# Patient Record
Sex: Female | Born: 1935 | Race: White | Hispanic: No | Smoking: Former smoker
Health system: Southern US, Community
[De-identification: ages and names within clinical notes are randomized; demographics above are authoritative.]

## PROBLEM LIST (undated history)

## (undated) DIAGNOSIS — I509 Heart failure, unspecified: Secondary | ICD-10-CM

## (undated) DIAGNOSIS — K279 Peptic ulcer, site unspecified, unspecified as acute or chronic, without hemorrhage or perforation: Secondary | ICD-10-CM

## (undated) DIAGNOSIS — K5790 Diverticulosis of intestine, part unspecified, without perforation or abscess without bleeding: Secondary | ICD-10-CM

## (undated) DIAGNOSIS — R002 Palpitations: Secondary | ICD-10-CM

## (undated) DIAGNOSIS — E785 Hyperlipidemia, unspecified: Secondary | ICD-10-CM

## (undated) DIAGNOSIS — E119 Type 2 diabetes mellitus without complications: Secondary | ICD-10-CM

## (undated) DIAGNOSIS — Z860100 Personal history of colon polyps, unspecified: Secondary | ICD-10-CM

## (undated) DIAGNOSIS — I10 Essential (primary) hypertension: Secondary | ICD-10-CM

## (undated) DIAGNOSIS — H409 Unspecified glaucoma: Secondary | ICD-10-CM

## (undated) DIAGNOSIS — Z8719 Personal history of other diseases of the digestive system: Secondary | ICD-10-CM

## (undated) DIAGNOSIS — C50919 Malignant neoplasm of unspecified site of unspecified female breast: Secondary | ICD-10-CM

## (undated) DIAGNOSIS — I739 Peripheral vascular disease, unspecified: Secondary | ICD-10-CM

## (undated) DIAGNOSIS — I4891 Unspecified atrial fibrillation: Secondary | ICD-10-CM

## (undated) DIAGNOSIS — K219 Gastro-esophageal reflux disease without esophagitis: Secondary | ICD-10-CM

## (undated) HISTORY — DX: Personal history of other diseases of the digestive system: Z87.19

## (undated) HISTORY — DX: Gastro-esophageal reflux disease without esophagitis: K21.9

## (undated) HISTORY — PX: RENAL ARTERY STENT: SHX2321

## (undated) HISTORY — DX: Personal history of colon polyps, unspecified: Z86.0100

## (undated) HISTORY — DX: Malignant neoplasm of unspecified site of unspecified female breast: C50.919

## (undated) HISTORY — DX: Type 2 diabetes mellitus without complications: E11.9

## (undated) HISTORY — PX: MASTECTOMY: SHX3

## (undated) HISTORY — PX: VAGINAL HYSTERECTOMY: SUR661

## (undated) HISTORY — DX: Diverticulosis of intestine, part unspecified, without perforation or abscess without bleeding: K57.90

## (undated) HISTORY — DX: Peripheral vascular disease, unspecified: I73.9

## (undated) HISTORY — DX: Hyperlipidemia, unspecified: E78.5

## (undated) HISTORY — DX: Peptic ulcer, site unspecified, unspecified as acute or chronic, without hemorrhage or perforation: K27.9

## (undated) HISTORY — DX: Personal history of colonic polyps: Z86.010

## (undated) HISTORY — DX: Heart failure, unspecified: I50.9

## (undated) HISTORY — DX: Palpitations: R00.2

## (undated) HISTORY — DX: Essential (primary) hypertension: I10

## (undated) HISTORY — DX: Unspecified glaucoma: H40.9

## (undated) HISTORY — DX: Unspecified atrial fibrillation: I48.91

---

## 2004-07-20 ENCOUNTER — Ambulatory Visit: Payer: Self-pay | Admitting: Oncology

## 2004-10-07 ENCOUNTER — Ambulatory Visit: Payer: Self-pay | Admitting: Oncology

## 2005-07-20 ENCOUNTER — Ambulatory Visit: Payer: Self-pay | Admitting: Oncology

## 2005-09-25 HISTORY — PX: OTHER SURGICAL HISTORY: SHX169

## 2005-10-09 ENCOUNTER — Ambulatory Visit: Payer: Self-pay | Admitting: Oncology

## 2005-11-03 ENCOUNTER — Ambulatory Visit: Payer: Self-pay

## 2005-11-04 ENCOUNTER — Other Ambulatory Visit: Payer: Self-pay

## 2005-11-04 ENCOUNTER — Inpatient Hospital Stay: Payer: Self-pay | Admitting: Internal Medicine

## 2006-02-06 ENCOUNTER — Inpatient Hospital Stay: Payer: Self-pay | Admitting: Unknown Physician Specialty

## 2006-02-06 ENCOUNTER — Other Ambulatory Visit: Payer: Self-pay

## 2006-02-12 ENCOUNTER — Encounter: Payer: Self-pay | Admitting: Internal Medicine

## 2006-02-23 ENCOUNTER — Encounter: Payer: Self-pay | Admitting: Internal Medicine

## 2006-07-16 ENCOUNTER — Ambulatory Visit: Payer: Self-pay | Admitting: Oncology

## 2006-10-11 ENCOUNTER — Ambulatory Visit: Payer: Self-pay | Admitting: Oncology

## 2007-05-31 ENCOUNTER — Ambulatory Visit: Payer: Self-pay

## 2007-06-11 ENCOUNTER — Inpatient Hospital Stay: Payer: Self-pay | Admitting: Internal Medicine

## 2007-06-26 ENCOUNTER — Ambulatory Visit: Payer: Self-pay | Admitting: Oncology

## 2007-07-10 ENCOUNTER — Ambulatory Visit: Payer: Self-pay | Admitting: Oncology

## 2007-07-27 ENCOUNTER — Ambulatory Visit: Payer: Self-pay | Admitting: Oncology

## 2007-10-14 ENCOUNTER — Ambulatory Visit: Payer: Self-pay | Admitting: Oncology

## 2007-12-03 ENCOUNTER — Ambulatory Visit: Payer: Self-pay | Admitting: Unknown Physician Specialty

## 2008-02-24 ENCOUNTER — Ambulatory Visit: Payer: Self-pay | Admitting: Oncology

## 2008-03-17 ENCOUNTER — Ambulatory Visit: Payer: Self-pay | Admitting: Oncology

## 2008-03-18 ENCOUNTER — Ambulatory Visit: Payer: Self-pay | Admitting: Unknown Physician Specialty

## 2008-03-25 ENCOUNTER — Ambulatory Visit: Payer: Self-pay | Admitting: Oncology

## 2008-04-25 ENCOUNTER — Ambulatory Visit: Payer: Self-pay | Admitting: Oncology

## 2008-07-09 ENCOUNTER — Ambulatory Visit: Payer: Self-pay | Admitting: Oncology

## 2008-07-26 ENCOUNTER — Ambulatory Visit: Payer: Self-pay | Admitting: Oncology

## 2008-10-14 ENCOUNTER — Ambulatory Visit: Payer: Self-pay | Admitting: Oncology

## 2009-01-06 ENCOUNTER — Ambulatory Visit: Payer: Self-pay | Admitting: Oncology

## 2009-01-23 ENCOUNTER — Ambulatory Visit: Payer: Self-pay | Admitting: Oncology

## 2009-06-25 ENCOUNTER — Ambulatory Visit: Payer: Self-pay | Admitting: Oncology

## 2009-07-06 ENCOUNTER — Ambulatory Visit: Payer: Self-pay | Admitting: Oncology

## 2009-07-26 ENCOUNTER — Ambulatory Visit: Payer: Self-pay | Admitting: Oncology

## 2009-10-15 ENCOUNTER — Ambulatory Visit: Payer: Self-pay | Admitting: Oncology

## 2010-03-31 ENCOUNTER — Ambulatory Visit: Payer: Self-pay | Admitting: Vascular Surgery

## 2010-06-25 ENCOUNTER — Ambulatory Visit: Payer: Self-pay | Admitting: Oncology

## 2010-06-28 ENCOUNTER — Inpatient Hospital Stay: Payer: Self-pay | Admitting: *Deleted

## 2010-07-05 ENCOUNTER — Ambulatory Visit: Payer: Self-pay | Admitting: Oncology

## 2010-07-26 ENCOUNTER — Ambulatory Visit: Payer: Self-pay | Admitting: Oncology

## 2010-10-17 ENCOUNTER — Ambulatory Visit: Payer: Self-pay | Admitting: Oncology

## 2011-07-06 ENCOUNTER — Ambulatory Visit: Payer: Self-pay | Admitting: Oncology

## 2011-07-27 ENCOUNTER — Ambulatory Visit: Payer: Self-pay | Admitting: Oncology

## 2011-10-19 ENCOUNTER — Ambulatory Visit: Payer: Self-pay | Admitting: Oncology

## 2012-08-15 ENCOUNTER — Emergency Department: Payer: Self-pay | Admitting: Emergency Medicine

## 2012-08-15 LAB — COMPREHENSIVE METABOLIC PANEL
Albumin: 3.8 g/dL (ref 3.4–5.0)
Anion Gap: 8 (ref 7–16)
BUN: 13 mg/dL (ref 7–18)
Chloride: 106 mmol/L (ref 98–107)
Co2: 24 mmol/L (ref 21–32)
Creatinine: 0.97 mg/dL (ref 0.60–1.30)
EGFR (African American): >60
Osmolality: 280 (ref 275–301)
Potassium: 4 mmol/L (ref 3.5–5.1)
SGOT(AST): 25 U/L (ref 15–37)
Sodium: 138 mmol/L (ref 136–145)
Total Protein: 7.9 g/dL (ref 6.4–8.2)

## 2012-08-15 LAB — CBC
HCT: 31.3 % — ABNORMAL LOW (ref 35.0–47.0)
MCH: 21.3 pg — ABNORMAL LOW (ref 26.0–34.0)
MCHC: 30.6 g/dL — ABNORMAL LOW (ref 32.0–36.0)
MCV: 70 fL — ABNORMAL LOW (ref 80–100)
Platelet: 250 10*3/uL (ref 150–440)
RDW: 16.5 % — ABNORMAL HIGH (ref 11.5–14.5)
WBC: 7.5 10*3/uL (ref 3.6–11.0)

## 2012-08-15 LAB — URINALYSIS, COMPLETE
Bacteria: NONE SEEN
Blood: NEGATIVE
Glucose,UR: NEGATIVE mg/dL (ref 0–75)
Hyaline Cast: 75
Leukocyte Esterase: NEGATIVE
Nitrite: NEGATIVE
Ph: 5 (ref 4.5–8.0)
Protein: 30
RBC,UR: 3 /HPF (ref 0–5)

## 2012-08-15 LAB — TROPONIN I: Troponin-I: <0.02 ng/mL

## 2012-08-15 LAB — PROTIME-INR
INR: 1
Prothrombin Time: 13.7 secs (ref 11.5–14.7)

## 2012-08-15 LAB — APTT: Activated PTT: 28.7 secs (ref 23.6–35.9)

## 2012-10-21 ENCOUNTER — Ambulatory Visit: Payer: Self-pay

## 2013-04-30 ENCOUNTER — Ambulatory Visit: Payer: Self-pay | Admitting: Unknown Physician Specialty

## 2013-05-07 ENCOUNTER — Ambulatory Visit: Payer: Self-pay | Admitting: Radiation Oncology

## 2013-10-23 ENCOUNTER — Ambulatory Visit: Payer: Self-pay

## 2013-10-28 ENCOUNTER — Ambulatory Visit: Payer: Self-pay

## 2013-12-11 ENCOUNTER — Observation Stay: Payer: Self-pay | Admitting: Internal Medicine

## 2013-12-11 DIAGNOSIS — I4891 Unspecified atrial fibrillation: Secondary | ICD-10-CM

## 2013-12-11 DIAGNOSIS — I10 Essential (primary) hypertension: Secondary | ICD-10-CM

## 2013-12-11 DIAGNOSIS — I059 Rheumatic mitral valve disease, unspecified: Secondary | ICD-10-CM

## 2013-12-11 DIAGNOSIS — D509 Iron deficiency anemia, unspecified: Secondary | ICD-10-CM

## 2013-12-11 DIAGNOSIS — E785 Hyperlipidemia, unspecified: Secondary | ICD-10-CM

## 2013-12-11 LAB — CBC
HCT: 28.8 % — ABNORMAL LOW (ref 35.0–47.0)
HGB: 8.4 g/dL — AB (ref 12.0–16.0)
MCH: 18.7 pg — ABNORMAL LOW (ref 26.0–34.0)
MCHC: 29.1 g/dL — ABNORMAL LOW (ref 32.0–36.0)
MCV: 64 fL — AB (ref 80–100)
Platelet: 201 10*3/uL (ref 150–440)
RBC: 4.48 10*6/uL (ref 3.80–5.20)
RDW: 17.3 % — ABNORMAL HIGH (ref 11.5–14.5)
WBC: 3.7 10*3/uL (ref 3.6–11.0)

## 2013-12-11 LAB — COMPREHENSIVE METABOLIC PANEL
ALT: 13 U/L (ref 12–78)
Albumin: 3.4 g/dL (ref 3.4–5.0)
Alkaline Phosphatase: 99 U/L
Anion Gap: 5 — ABNORMAL LOW (ref 7–16)
BILIRUBIN TOTAL: 0.3 mg/dL (ref 0.2–1.0)
BUN: 19 mg/dL — ABNORMAL HIGH (ref 7–18)
CHLORIDE: 106 mmol/L (ref 98–107)
CO2: 28 mmol/L (ref 21–32)
CREATININE: 0.96 mg/dL (ref 0.60–1.30)
Calcium, Total: 9.3 mg/dL (ref 8.5–10.1)
EGFR (Non-African Amer.): 52 — ABNORMAL LOW
Glucose: 171 mg/dL — ABNORMAL HIGH (ref 65–99)
OSMOLALITY: 284 (ref 275–301)
Potassium: 3.1 mmol/L — ABNORMAL LOW (ref 3.5–5.1)
SGOT(AST): 15 U/L (ref 15–37)
Sodium: 139 mmol/L (ref 136–145)
Total Protein: 7.3 g/dL (ref 6.4–8.2)

## 2013-12-11 LAB — URINALYSIS, COMPLETE
BACTERIA: NONE SEEN
BILIRUBIN, UR: NEGATIVE
Blood: NEGATIVE
Glucose,UR: NEGATIVE mg/dL (ref 0–75)
Hyaline Cast: 10
Ketone: NEGATIVE
LEUKOCYTE ESTERASE: NEGATIVE
Nitrite: NEGATIVE
Ph: 6 (ref 4.5–8.0)
Protein: 100
SPECIFIC GRAVITY: 1.015 (ref 1.003–1.030)
Squamous Epithelial: 1
WBC UR: 2 /HPF (ref 0–5)

## 2013-12-11 LAB — LIPASE, BLOOD: Lipase: 277 U/L (ref 73–393)

## 2013-12-11 LAB — TROPONIN I: Troponin-I: <0.02 ng/mL

## 2013-12-11 LAB — MAGNESIUM: MAGNESIUM: 2.1 mg/dL

## 2013-12-12 LAB — BASIC METABOLIC PANEL
ANION GAP: 6 — AB (ref 7–16)
BUN: 12 mg/dL (ref 7–18)
Calcium, Total: 9.1 mg/dL (ref 8.5–10.1)
Chloride: 111 mmol/L — ABNORMAL HIGH (ref 98–107)
Co2: 25 mmol/L (ref 21–32)
Creatinine: 0.71 mg/dL (ref 0.60–1.30)
EGFR (Non-African Amer.): >60
Glucose: 127 mg/dL — ABNORMAL HIGH (ref 65–99)
OSMOLALITY: 284 (ref 275–301)
POTASSIUM: 3.8 mmol/L (ref 3.5–5.1)
SODIUM: 142 mmol/L (ref 136–145)

## 2013-12-12 LAB — CBC WITH DIFFERENTIAL/PLATELET
BASOS PCT: 1.4 %
Basophil #: 0 10*3/uL (ref 0.0–0.1)
EOS ABS: 0.1 10*3/uL (ref 0.0–0.7)
EOS PCT: 3.7 %
HCT: 24.6 % — ABNORMAL LOW (ref 35.0–47.0)
HGB: 7.2 g/dL — ABNORMAL LOW (ref 12.0–16.0)
Lymphocyte #: 1.1 10*3/uL (ref 1.0–3.6)
Lymphocyte %: 35.3 %
MCH: 18.8 pg — ABNORMAL LOW (ref 26.0–34.0)
MCHC: 29.3 g/dL — AB (ref 32.0–36.0)
MCV: 64 fL — AB (ref 80–100)
MONO ABS: 0.3 x10 3/mm (ref 0.2–0.9)
MONOS PCT: 8.6 %
NEUTROS PCT: 51 %
Neutrophil #: 1.7 10*3/uL (ref 1.4–6.5)
PLATELETS: 154 10*3/uL (ref 150–440)
RBC: 3.85 10*6/uL (ref 3.80–5.20)
RDW: 17.6 % — ABNORMAL HIGH (ref 11.5–14.5)
WBC: 3.2 10*3/uL — AB (ref 3.6–11.0)

## 2013-12-12 LAB — HEMOGLOBIN A1C: Hemoglobin A1C: 7.5 % — ABNORMAL HIGH (ref 4.2–6.3)

## 2013-12-12 LAB — TSH: Thyroid Stimulating Horm: 0.748 u[IU]/mL

## 2013-12-13 DIAGNOSIS — I4892 Unspecified atrial flutter: Secondary | ICD-10-CM

## 2013-12-13 LAB — CBC WITH DIFFERENTIAL/PLATELET
BASOS ABS: 0.1 10*3/uL (ref 0.0–0.1)
BASOS PCT: 1.6 %
EOS PCT: 3.4 %
Eosinophil #: 0.1 10*3/uL (ref 0.0–0.7)
HCT: 26.2 % — AB (ref 35.0–47.0)
HGB: 7.7 g/dL — ABNORMAL LOW (ref 12.0–16.0)
Lymphocyte #: 1.1 10*3/uL (ref 1.0–3.6)
Lymphocyte %: 32.7 %
MCH: 18.8 pg — AB (ref 26.0–34.0)
MCHC: 29.3 g/dL — AB (ref 32.0–36.0)
MCV: 64 fL — ABNORMAL LOW (ref 80–100)
Monocyte #: 0.3 x10 3/mm (ref 0.2–0.9)
Monocyte %: 8 %
NEUTROS ABS: 1.8 10*3/uL (ref 1.4–6.5)
NEUTROS PCT: 54.3 %
Platelet: 190 10*3/uL (ref 150–440)
RBC: 4.08 10*6/uL (ref 3.80–5.20)
RDW: 17.5 % — ABNORMAL HIGH (ref 11.5–14.5)
WBC: 3.4 10*3/uL — ABNORMAL LOW (ref 3.6–11.0)

## 2013-12-23 ENCOUNTER — Telehealth: Payer: Self-pay

## 2013-12-23 NOTE — Telephone Encounter (Signed)
Patient contacted regarding discharge from [facility] on 12/25/13.  Patient understands to follow up with Dr. Rockey Situ on 12/25/13 at 1:40 at [facility]. Patient understands discharge instructions? yes Patient understands medications and regiment? yes Patient understands to bring all medications to this visit? yes

## 2013-12-25 ENCOUNTER — Encounter: Payer: Self-pay | Admitting: Cardiovascular Disease

## 2013-12-25 ENCOUNTER — Ambulatory Visit (INDEPENDENT_AMBULATORY_CARE_PROVIDER_SITE_OTHER): Payer: Medicare Other | Admitting: Cardiovascular Disease

## 2013-12-25 VITALS — BP 130/52 | HR 73 | Ht 60.0 in | Wt 103.5 lb

## 2013-12-25 DIAGNOSIS — K922 Gastrointestinal hemorrhage, unspecified: Secondary | ICD-10-CM | POA: Insufficient documentation

## 2013-12-25 DIAGNOSIS — E119 Type 2 diabetes mellitus without complications: Secondary | ICD-10-CM

## 2013-12-25 DIAGNOSIS — D649 Anemia, unspecified: Secondary | ICD-10-CM

## 2013-12-25 DIAGNOSIS — I4891 Unspecified atrial fibrillation: Secondary | ICD-10-CM

## 2013-12-25 DIAGNOSIS — E785 Hyperlipidemia, unspecified: Secondary | ICD-10-CM

## 2013-12-25 MED ORDER — METOPROLOL TARTRATE 25 MG PO TABS: 25.0000 mg | ORAL_TABLET | Freq: Two times a day (BID) | ORAL | Status: DC

## 2013-12-25 MED ORDER — DILTIAZEM HCL 120 MG PO TABS: 120.0000 mg | ORAL_TABLET | Freq: Every day | ORAL | Status: DC

## 2013-12-25 NOTE — Assessment & Plan Note (Signed)
We have encouraged continued exercise, careful diet management in an effort to lose weight. She reports having blurry vision. Suggested she see her eye physician

## 2013-12-25 NOTE — Assessment & Plan Note (Signed)
Encouraged her to stay on her simvastatin 

## 2013-12-25 NOTE — Assessment & Plan Note (Signed)
She has followup with Dr. Hardin Negus. Recent hematocrit in the hospital 28.8, hemoglobin 8.4

## 2013-12-25 NOTE — Assessment & Plan Note (Addendum)
Hospital records were reviewed. Heart rate is relatively well-controlled. Suggested she stay on her diltiazem and metoprolol. We'll avoid anticoagulation given significant extensive ongoing GI bleeding history. Seen by Dr. Vira Agar on a regular basis. Risk and benefit of anticoagulation was discussed with her at length with her and her husband

## 2013-12-25 NOTE — Progress Notes (Signed)
Patient ID: Girtha Rm, female    DOB: June 14, 1922, 80 y.o.   MRN: [id number]  HPI Comments: Sharon Frazier is a pleasant 80 year old woman with history of GI bleed, iron deficiency anemia, laser treatment of gastric angiodysplasias , peptic ulcer disease, GERD, diverticulosis and internal hemorrhoids, hypertension, hyperlipidemia, type 2 diabetes, previous history of tobacco abuse, right breast cancer status post mastectomy and chemotherapy with recent hospital admission 12/11/2013 and discharged 12/14/2013 for new onset atrial fibrillation.  On admission she reported that she was doing well until 2 weeks prior when she developed bronchitis with weakness and cough with sputum. The morning of admission she woke up and did not feel well. She had tachycardia with palpitations. In the emergency room she was found to have atrial fibrillation with RVR. She was started on rate control medications and she was felt to be a poor candidate for anticoagulation. Unclear exactly how long she had been in atrial fibrillation.  Echocardiogram showed ejection fraction 60-65%, mildly elevated right ventricular systolic pressures Notes indicate she had been seen by Dr. Vira Agar recently with EGD and electrocautery. She still had trace blood in her stool on followup visits with him. She was started on diltiazem 120 mg daily, amlodipine was held, she was started on metoprolol  In followup today she feels well with no complaints. She reports that her vision is somewhat blurry which she attributes to her diabetes. Otherwise no falls, no shortness of breath, no near syncope or syncope. No significant chest pain.  EKG shows atrial fibrillation with rate 73 beats a minute, no significant ST or T wave changes, some borderline ST abnormality    Outpatient Encounter Prescriptions as of 12/25/2013  Medication Sig  . diltiazem (CARDIZEM) 120 MG tablet Take 1 tablet (120 mg total) by mouth daily.  . dorzolamide (TRUSOPT) 2 %  ophthalmic solution 1 drop 2 (two) times daily.  Marland Kitchen glipiZIDE (GLUCOTROL) 5 MG tablet Take 5 mg by mouth daily before breakfast.  . iron polysaccharides (NIFEREX) 150 MG capsule Take 150 mg by mouth 2 (two) times daily.   Marland Kitchen latanoprost (XALATAN) 0.005 % ophthalmic solution Place 1 drop into both eyes at bedtime.  Marland Kitchen lisinopril-hydrochlorothiazide (PRINZIDE,ZESTORETIC) 20-12.5 MG per tablet Take 1 tablet by mouth daily.  . metoprolol tartrate (LOPRESSOR) 25 MG tablet Take 1 tablet (25 mg total) by mouth 2 (two) times daily.  Marland Kitchen omeprazole (PRILOSEC) 20 MG capsule Take 20 mg by mouth daily.  . simvastatin (ZOCOR) 10 MG tablet Take 10 mg by mouth daily.    Review of Systems  Constitutional: Negative.   HENT: Negative.   Eyes: Positive for visual disturbance.  Respiratory: Negative.   Cardiovascular: Negative.   Gastrointestinal: Negative.   Endocrine: Negative.   Musculoskeletal: Negative.   Skin: Negative.   Allergic/Immunologic: Negative.   Neurological: Negative.   Hematological: Negative.   Psychiatric/Behavioral: Negative.   All other systems reviewed and are negative.    BP 130/52  Pulse 73  Ht 5' (1.524 m)  Wt 103 lb 8 oz (46.947 kg)  BMI 20.21 kg/m2  Physical Exam  Nursing note and vitals reviewed. Constitutional: She is oriented to person, place, and time. She appears well-developed and well-nourished.  HENT:  Head: Normocephalic.  Nose: Nose normal.  Mouth/Throat: Oropharynx is clear and moist.  Eyes: Conjunctivae are normal. Pupils are equal, round, and reactive to light.  Neck: Normal range of motion. Neck supple. No JVD present.  Cardiovascular: Normal rate, S1 normal, S2 normal, normal heart sounds and  intact distal pulses.  An irregularly irregular rhythm present. Exam reveals no gallop and no friction rub.   No murmur heard. Pulmonary/Chest: Effort normal and breath sounds normal. No respiratory distress. She has no wheezes. She has no rales. She exhibits no  tenderness.  Abdominal: Soft. Bowel sounds are normal. She exhibits no distension. There is no tenderness.  Musculoskeletal: Normal range of motion. She exhibits no edema and no tenderness.  Lymphadenopathy:    She has no cervical adenopathy.  Neurological: She is alert and oriented to person, place, and time. Coordination normal.  Skin: Skin is warm and dry. No rash noted. No erythema.  Psychiatric: She has a normal mood and affect. Her behavior is normal. Judgment and thought content normal.    Assessment and Plan

## 2013-12-25 NOTE — Assessment & Plan Note (Signed)
Recent procedures, long history of GI bleeding. High risk of recurrent bleeding if anticoagulation is started

## 2013-12-25 NOTE — Patient Instructions (Signed)
You are doing well. No medication changes were made.  Call the office for leg swelling, shortness of breath, weight gain   Please call us if you have new issues that need to be addressed before your next appt.  Your physician wants you to follow-up in: 6 months.  You will receive a reminder letter in the mail two months in advance. If you don't receive a letter, please call our office to schedule the follow-up appointment.

## 2014-02-05 ENCOUNTER — Inpatient Hospital Stay: Payer: Self-pay | Admitting: Family Medicine

## 2014-02-05 LAB — BASIC METABOLIC PANEL
ANION GAP: 8 (ref 7–16)
BUN: 17 mg/dL (ref 7–18)
Calcium, Total: 10 mg/dL (ref 8.5–10.1)
Chloride: 105 mmol/L (ref 98–107)
Co2: 25 mmol/L (ref 21–32)
Creatinine: 0.87 mg/dL (ref 0.60–1.30)
EGFR (African American): >60
EGFR (Non-African Amer.): 59 — ABNORMAL LOW
Glucose: 114 mg/dL — ABNORMAL HIGH (ref 65–99)
OSMOLALITY: 278 (ref 275–301)
POTASSIUM: 3.5 mmol/L (ref 3.5–5.1)
SODIUM: 138 mmol/L (ref 136–145)

## 2014-02-05 LAB — URINALYSIS, COMPLETE
Bacteria: NONE SEEN
Bilirubin,UR: NEGATIVE
Blood: NEGATIVE
Glucose,UR: NEGATIVE mg/dL (ref 0–75)
Nitrite: NEGATIVE
Ph: 5 (ref 4.5–8.0)
RBC,UR: 1 /HPF (ref 0–5)
Specific Gravity: 1.021 (ref 1.003–1.030)
WBC UR: 5 /HPF (ref 0–5)

## 2014-02-05 LAB — CBC WITH DIFFERENTIAL/PLATELET
Basophil #: 0.1 10*3/uL (ref 0.0–0.1)
Basophil %: 0.6 %
EOS ABS: 0 10*3/uL (ref 0.0–0.7)
Eosinophil %: 0.1 %
HCT: 31 % — ABNORMAL LOW (ref 35.0–47.0)
HGB: 10 g/dL — ABNORMAL LOW (ref 12.0–16.0)
LYMPHS PCT: 10.5 %
Lymphocyte #: 1 10*3/uL (ref 1.0–3.6)
MCH: 20.1 pg — ABNORMAL LOW (ref 26.0–34.0)
MCHC: 32.4 g/dL (ref 32.0–36.0)
MCV: 62 fL — AB (ref 80–100)
MONO ABS: 0.7 x10 3/mm (ref 0.2–0.9)
MONOS PCT: 7.4 %
Neutrophil #: 7.5 10*3/uL — ABNORMAL HIGH (ref 1.4–6.5)
Neutrophil %: 81.4 %
PLATELETS: 222 10*3/uL (ref 150–440)
RBC: 4.99 10*6/uL (ref 3.80–5.20)
RDW: 18.2 % — ABNORMAL HIGH (ref 11.5–14.5)
WBC: 9.2 10*3/uL (ref 3.6–11.0)

## 2014-02-05 LAB — TROPONIN I: Troponin-I: <0.02 ng/mL

## 2014-02-05 LAB — IRON AND TIBC
IRON BIND. CAP.(TOTAL): 510 ug/dL — AB (ref 250–450)
IRON: 21 ug/dL — AB (ref 50–170)
Iron Saturation: 4 %
Unbound Iron-Bind.Cap.: 489 ug/dL

## 2014-02-06 DIAGNOSIS — R10819 Abdominal tenderness, unspecified site: Secondary | ICD-10-CM

## 2014-02-06 DIAGNOSIS — D500 Iron deficiency anemia secondary to blood loss (chronic): Secondary | ICD-10-CM

## 2014-02-06 DIAGNOSIS — R5381 Other malaise: Secondary | ICD-10-CM

## 2014-02-06 DIAGNOSIS — R5383 Other fatigue: Secondary | ICD-10-CM

## 2014-02-06 DIAGNOSIS — I4891 Unspecified atrial fibrillation: Secondary | ICD-10-CM

## 2014-02-06 LAB — CBC WITH DIFFERENTIAL/PLATELET
Basophil #: 0.1 10*3/uL (ref 0.0–0.1)
Basophil %: 1.1 %
EOS ABS: 0.1 10*3/uL (ref 0.0–0.7)
EOS PCT: 1.1 %
HCT: 25.9 % — ABNORMAL LOW (ref 35.0–47.0)
HGB: 7.5 g/dL — ABNORMAL LOW (ref 12.0–16.0)
Lymphocyte #: 0.8 10*3/uL — ABNORMAL LOW (ref 1.0–3.6)
Lymphocyte %: 13.3 %
MCH: 18 pg — ABNORMAL LOW (ref 26.0–34.0)
MCHC: 28.8 g/dL — ABNORMAL LOW (ref 32.0–36.0)
MCV: 62 fL — AB (ref 80–100)
MONO ABS: 0.6 x10 3/mm (ref 0.2–0.9)
Monocyte %: 9.7 %
NEUTROS ABS: 4.7 10*3/uL (ref 1.4–6.5)
NEUTROS PCT: 74.8 %
Platelet: 172 10*3/uL (ref 150–440)
RBC: 4.15 10*6/uL (ref 3.80–5.20)
RDW: 17.7 % — ABNORMAL HIGH (ref 11.5–14.5)
WBC: 6.3 10*3/uL (ref 3.6–11.0)

## 2014-02-06 LAB — HEMOGLOBIN: HGB: 7.8 g/dL — AB (ref 12.0–16.0)

## 2014-02-06 LAB — OCCULT BLOOD X 1 CARD TO LAB, STOOL: Occult Blood, Feces: NEGATIVE

## 2014-02-06 LAB — BASIC METABOLIC PANEL
ANION GAP: 6 — AB (ref 7–16)
BUN: 15 mg/dL (ref 7–18)
CHLORIDE: 110 mmol/L — AB (ref 98–107)
Calcium, Total: 9.1 mg/dL (ref 8.5–10.1)
Co2: 26 mmol/L (ref 21–32)
Creatinine: 0.74 mg/dL (ref 0.60–1.30)
EGFR (African American): >60
EGFR (Non-African Amer.): >60
Glucose: 98 mg/dL (ref 65–99)
Osmolality: 284 (ref 275–301)
Potassium: 3.6 mmol/L (ref 3.5–5.1)
Sodium: 142 mmol/L (ref 136–145)

## 2014-02-06 LAB — FERRITIN: Ferritin (ARMC): 10 ng/mL (ref 8–388)

## 2014-02-06 LAB — LIPID PANEL
CHOLESTEROL: 101 mg/dL (ref 0–200)
HDL Cholesterol: 38 mg/dL — ABNORMAL LOW (ref 40–60)
LDL CHOLESTEROL, CALC: 48 mg/dL (ref 0–100)
TRIGLYCERIDES: 75 mg/dL (ref 0–200)
VLDL CHOLESTEROL, CALC: 15 mg/dL (ref 5–40)

## 2014-02-06 LAB — IRON AND TIBC
Iron Bind.Cap.(Total): 395 ug/dL (ref 250–450)
Iron Saturation: 4 %
Iron: 14 ug/dL — ABNORMAL LOW (ref 50–170)
UNBOUND IRON-BIND. CAP.: 381 ug/dL

## 2014-02-06 LAB — TSH: THYROID STIMULATING HORM: 0.531 u[IU]/mL

## 2014-02-06 LAB — LIPASE, BLOOD: LIPASE: 257 U/L (ref 73–393)

## 2014-02-06 LAB — MAGNESIUM: Magnesium: 2 mg/dL

## 2014-02-06 LAB — HEMOGLOBIN A1C: Hemoglobin A1C: 6.6 % — ABNORMAL HIGH (ref 4.2–6.3)

## 2014-02-07 DIAGNOSIS — I369 Nonrheumatic tricuspid valve disorder, unspecified: Secondary | ICD-10-CM

## 2014-02-07 LAB — HEPATIC FUNCTION PANEL A (ARMC)
ALBUMIN: 3.2 g/dL — AB (ref 3.4–5.0)
ALK PHOS: 87 U/L
ALT: 12 U/L (ref 12–78)
AST: 14 U/L — AB (ref 15–37)
BILIRUBIN TOTAL: 0.4 mg/dL (ref 0.2–1.0)
Bilirubin, Direct: 0.1 mg/dL (ref 0.00–0.20)
TOTAL PROTEIN: 7.1 g/dL (ref 6.4–8.2)

## 2014-02-09 ENCOUNTER — Telehealth: Payer: Self-pay

## 2014-02-09 NOTE — Telephone Encounter (Signed)
L MOM TO SCHEDULE PH APPT

## 2014-04-20 ENCOUNTER — Inpatient Hospital Stay: Payer: Self-pay | Admitting: Internal Medicine

## 2014-04-20 LAB — PROTIME-INR
INR: 1.2
Prothrombin Time: 15.2 secs — ABNORMAL HIGH (ref 11.5–14.7)

## 2014-04-20 LAB — COMPREHENSIVE METABOLIC PANEL
ALBUMIN: 3.4 g/dL (ref 3.4–5.0)
ANION GAP: 3 — AB (ref 7–16)
AST: 22 U/L (ref 15–37)
Alkaline Phosphatase: 73 U/L
BILIRUBIN TOTAL: 0.6 mg/dL (ref 0.2–1.0)
BUN: 24 mg/dL — AB (ref 7–18)
CALCIUM: 9.3 mg/dL (ref 8.5–10.1)
CREATININE: 0.7 mg/dL (ref 0.60–1.30)
Chloride: 109 mmol/L — ABNORMAL HIGH (ref 98–107)
Co2: 31 mmol/L (ref 21–32)
EGFR (Non-African Amer.): >60
Glucose: 140 mg/dL — ABNORMAL HIGH (ref 65–99)
Osmolality: 291 (ref 275–301)
Potassium: 3.5 mmol/L (ref 3.5–5.1)
SGPT (ALT): 17 U/L
Sodium: 143 mmol/L (ref 136–145)
TOTAL PROTEIN: 6.8 g/dL (ref 6.4–8.2)

## 2014-04-20 LAB — CBC
HCT: 26.4 % — AB (ref 35.0–47.0)
HGB: 7.1 g/dL — ABNORMAL LOW (ref 12.0–16.0)
MCH: 16.8 pg — ABNORMAL LOW (ref 26.0–34.0)
MCHC: 27 g/dL — ABNORMAL LOW (ref 32.0–36.0)
MCV: 63 fL — ABNORMAL LOW (ref 80–100)
Platelet: 187 10*3/uL (ref 150–440)
RBC: 4.23 10*6/uL (ref 3.80–5.20)
RDW: 19 % — AB (ref 11.5–14.5)
WBC: 6.1 10*3/uL (ref 3.6–11.0)

## 2014-04-20 LAB — CK TOTAL AND CKMB (NOT AT ARMC)
CK, TOTAL: 21 U/L — AB
CK-MB: 1 ng/mL (ref 0.5–3.6)

## 2014-04-20 LAB — TROPONIN I
Troponin-I: <0.02 ng/mL
Troponin-I: <0.02 ng/mL

## 2014-04-20 LAB — PRO B NATRIURETIC PEPTIDE: B-TYPE NATIURETIC PEPTID: 10312 pg/mL — AB (ref 0–450)

## 2014-04-21 LAB — CBC WITH DIFFERENTIAL/PLATELET
Basophil #: 0 10*3/uL (ref 0.0–0.1)
Basophil %: 0.8 %
EOS ABS: 0 10*3/uL (ref 0.0–0.7)
Eosinophil %: 0.3 %
HCT: 24.5 % — ABNORMAL LOW (ref 35.0–47.0)
HGB: 6.8 g/dL — ABNORMAL LOW (ref 12.0–16.0)
LYMPHS ABS: 0.9 10*3/uL — AB (ref 1.0–3.6)
Lymphocyte %: 16.9 %
MCH: 17 pg — ABNORMAL LOW (ref 26.0–34.0)
MCHC: 27.7 g/dL — AB (ref 32.0–36.0)
MCV: 61 fL — ABNORMAL LOW (ref 80–100)
MONO ABS: 0.5 x10 3/mm (ref 0.2–0.9)
Monocyte %: 9.5 %
NEUTROS ABS: 3.9 10*3/uL (ref 1.4–6.5)
Neutrophil %: 72.5 %
PLATELETS: 175 10*3/uL (ref 150–440)
RBC: 3.98 10*6/uL (ref 3.80–5.20)
RDW: 19.2 % — AB (ref 11.5–14.5)
WBC: 5.4 10*3/uL (ref 3.6–11.0)

## 2014-04-22 LAB — CBC WITH DIFFERENTIAL/PLATELET
Basophil #: 0 10*3/uL (ref 0.0–0.1)
Basophil %: 0.9 %
EOS PCT: 1.7 %
Eosinophil #: 0.1 10*3/uL (ref 0.0–0.7)
HCT: 29 % — ABNORMAL LOW (ref 35.0–47.0)
HGB: 8.5 g/dL — ABNORMAL LOW (ref 12.0–16.0)
Lymphocyte #: 0.8 10*3/uL — ABNORMAL LOW (ref 1.0–3.6)
Lymphocyte %: 19.3 %
MCH: 19.1 pg — ABNORMAL LOW (ref 26.0–34.0)
MCHC: 29.3 g/dL — AB (ref 32.0–36.0)
MCV: 65 fL — AB (ref 80–100)
Monocyte #: 0.6 x10 3/mm (ref 0.2–0.9)
Monocyte %: 13.1 %
Neutrophil #: 2.8 10*3/uL (ref 1.4–6.5)
Neutrophil %: 65 %
PLATELETS: 164 10*3/uL (ref 150–440)
RBC: 4.44 10*6/uL (ref 3.80–5.20)
RDW: 23.5 % — AB (ref 11.5–14.5)
WBC: 4.4 10*3/uL (ref 3.6–11.0)

## 2014-04-22 LAB — BASIC METABOLIC PANEL
Anion Gap: 7 (ref 7–16)
BUN: 18 mg/dL (ref 7–18)
CO2: 36 mmol/L — AB (ref 21–32)
CREATININE: 0.81 mg/dL (ref 0.60–1.30)
Calcium, Total: 9 mg/dL (ref 8.5–10.1)
Chloride: 98 mmol/L (ref 98–107)
EGFR (Non-African Amer.): >60
Glucose: 67 mg/dL (ref 65–99)
Osmolality: 281 (ref 275–301)
Potassium: 2.4 mmol/L — CL (ref 3.5–5.1)
SODIUM: 141 mmol/L (ref 136–145)

## 2014-04-22 LAB — MAGNESIUM: Magnesium: 1.7 mg/dL — ABNORMAL LOW

## 2014-04-22 LAB — POTASSIUM: Potassium: 3.8 mmol/L (ref 3.5–5.1)

## 2014-04-23 LAB — BODY FLUID CELL COUNT WITH DIFFERENTIAL
Basophil: 0 %
EOS PCT: 0 %
Lymphocytes: 65 %
Neutrophils: 6 %
Nucleated Cell Count: 170 /mm3
Other Cells BF: 0 %
Other Mononuclear Cells: 29 %

## 2014-04-23 LAB — BASIC METABOLIC PANEL
Anion Gap: 5 — ABNORMAL LOW (ref 7–16)
BUN: 14 mg/dL (ref 7–18)
CREATININE: 0.69 mg/dL (ref 0.60–1.30)
Calcium, Total: 9.7 mg/dL (ref 8.5–10.1)
Chloride: 101 mmol/L (ref 98–107)
Co2: 35 mmol/L — ABNORMAL HIGH (ref 21–32)
EGFR (Non-African Amer.): >60
Glucose: 103 mg/dL — ABNORMAL HIGH (ref 65–99)
Osmolality: 282 (ref 275–301)
Potassium: 3.9 mmol/L (ref 3.5–5.1)
Sodium: 141 mmol/L (ref 136–145)

## 2014-04-23 LAB — GLUCOSE, SEROUS FLUID: GLUCOSE, BODY FLUID: 194 mg/dL

## 2014-04-23 LAB — PROTEIN, BODY FLUID: Protein, Body Fluid: 2.4 g/dL

## 2014-04-23 LAB — ALBUMIN, FLUID (OTHER): Body Fluid Albumin: 1.4 g/dL

## 2014-04-23 LAB — LACTATE DEHYDROGENASE, PLEURAL OR PERITONEAL FLUID: LDH, Body Fluid: 48 U/L

## 2014-04-24 LAB — BASIC METABOLIC PANEL
Anion Gap: 5 — ABNORMAL LOW (ref 7–16)
BUN: 16 mg/dL (ref 7–18)
CALCIUM: 9.7 mg/dL (ref 8.5–10.1)
CHLORIDE: 102 mmol/L (ref 98–107)
CO2: 33 mmol/L — AB (ref 21–32)
Creatinine: 0.66 mg/dL (ref 0.60–1.30)
EGFR (African American): >60
Glucose: 101 mg/dL — ABNORMAL HIGH (ref 65–99)
Osmolality: 281 (ref 275–301)
POTASSIUM: 4.1 mmol/L (ref 3.5–5.1)
SODIUM: 140 mmol/L (ref 136–145)

## 2014-04-27 LAB — BODY FLUID CULTURE

## 2014-05-05 ENCOUNTER — Ambulatory Visit: Payer: Self-pay | Admitting: Family

## 2014-05-25 ENCOUNTER — Ambulatory Visit: Payer: Self-pay | Admitting: Family

## 2014-06-19 ENCOUNTER — Encounter: Payer: Self-pay | Admitting: Cardiovascular Disease

## 2014-06-19 ENCOUNTER — Ambulatory Visit (INDEPENDENT_AMBULATORY_CARE_PROVIDER_SITE_OTHER): Payer: Medicare Other | Admitting: Cardiovascular Disease

## 2014-06-19 VITALS — BP 99/60 | HR 69 | Ht 59.0 in | Wt 94.8 lb

## 2014-06-19 DIAGNOSIS — K2991 Gastroduodenitis, unspecified, with bleeding: Secondary | ICD-10-CM

## 2014-06-19 DIAGNOSIS — K2971 Gastritis, unspecified, with bleeding: Secondary | ICD-10-CM

## 2014-06-19 DIAGNOSIS — I509 Heart failure, unspecified: Secondary | ICD-10-CM

## 2014-06-19 DIAGNOSIS — I5032 Chronic diastolic (congestive) heart failure: Secondary | ICD-10-CM | POA: Insufficient documentation

## 2014-06-19 DIAGNOSIS — E785 Hyperlipidemia, unspecified: Secondary | ICD-10-CM

## 2014-06-19 DIAGNOSIS — I4891 Unspecified atrial fibrillation: Secondary | ICD-10-CM

## 2014-06-19 DIAGNOSIS — D508 Other iron deficiency anemias: Secondary | ICD-10-CM

## 2014-06-19 MED ORDER — FUROSEMIDE 40 MG PO TABS
40.0000 mg | ORAL_TABLET | Freq: Two times a day (BID) | ORAL | Status: DC | PRN
Start: 2014-06-19 — End: 2015-08-16

## 2014-06-19 NOTE — Assessment & Plan Note (Signed)
Recommended that she stay on her simvastatin. Significant coronary disease seen on CT scan 

## 2014-06-19 NOTE — Progress Notes (Signed)
Patient ID: Sharon Frazier, female    DOB: 27-Mar-1922, 80 y.o.   MRN: [id number]  HPI Comments: Sharon Frazier is a pleasant 80 year old woman with history of GI bleed, iron deficiency anemia, laser treatment of gastric angiodysplasias , peptic ulcer disease, GERD, diverticulosis and internal hemorrhoids, hypertension, hyperlipidemia, type 2 diabetes, previous history of tobacco abuse, right breast cancer status post mastectomy and chemotherapy with  hospital admission 12/11/2013 and discharged 12/14/2013 for new onset atrial fibrillation.  she developed bronchitis with weakness and cough with sputum.  In the emergency room she was found to have atrial fibrillation with RVR. She was started on rate control medications and she was felt to be a poor candidate for anticoagulation. Unclear exactly how long she had been in atrial fibrillation.  In followup today, she reports that she was in the hospital at the end of July 2015. She presented with shortness of breath and leg swelling. She had pleural effusion. She was started on Lasix with aggressive diuresis. Discharged on Lasix 40 mg twice a day. She was not on Lasix prior to her admission She did have thoracentesis on the right during her hospital stay. Discharge on 04/24/2014. She did have one unit packed red blood cells Prior echocardiogram with ejection fraction 65%. She continues to have problems with anemia. She reports most recent hemoglobin done through Dr. Hardin Negus was 7 Creatinine 1.07, BUN 36  In the hospital CT scan of the chest showing possible bronchopneumonia, moderate bilateral pleural effusions, right greater than left, atherosclerosis of the coronary arteries, emphysema, cirrhosis, abdominal ascites  Overall she feels well today. She denies any significant lower extremity edema. No significant cough or shortness of breath. Recently seen in the heart failure clinic 05/25/2014. No medication changes made at that time.  Echocardiogram  showed ejection fraction 60-65%, mildly elevated right ventricular systolic pressures Notes indicate she had been seen by Dr. Vira Agar recently with EGD and electrocautery. She still had trace blood in her stool on followup visits with him. She was started on diltiazem 120 mg daily, amlodipine was held, she was started on metoprolol  In followup today she feels well with no complaints. She reports that her vision is somewhat blurry which she attributes to her diabetes. Otherwise no falls, no shortness of breath, no near syncope or syncope. No significant chest pain.  EKG shows atrial fibrillation with rate 69 beats a minute, no significant ST or T wave changes, some borderline ST abnormality    Outpatient Encounter Prescriptions as of 06/19/2014  Medication Sig  . diltiazem (CARDIZEM) 120 MG tablet Take 1 tablet (120 mg total) by mouth daily.  . dorzolamide (TRUSOPT) 2 % ophthalmic solution 1 drop 2 (two) times daily.  . furosemide (LASIX) 40 MG tablet Take 40 mg by mouth 2 (two) times daily.  Marland Kitchen glipiZIDE (GLUCOTROL XL) 2.5 MG 24 hr tablet Take 2.5 mg by mouth daily with breakfast.  . iron polysaccharides (NIFEREX) 150 MG capsule Take 150 mg by mouth 2 (two) times daily.   . iron polysaccharides (NIFEREX) 150 MG capsule Take 150 mg by mouth 2 (two) times daily.  Marland Kitchen latanoprost (XALATAN) 0.005 % ophthalmic solution Place 1 drop into both eyes at bedtime.  Marland Kitchen lisinopril-hydrochlorothiazide (PRINZIDE,ZESTORETIC) 10-12.5 MG per tablet Take 1 tablet by mouth daily.  . metoprolol tartrate (LOPRESSOR) 25 MG tablet Take 1 tablet (25 mg total) by mouth 2 (two) times daily.  Marland Kitchen omeprazole (PRILOSEC) 20 MG capsule Take 20 mg by mouth daily.  . potassium chloride (  K-DUR,KLOR-CON) 10 MEQ tablet Take 10 mEq by mouth 2 (two) times daily.  . simvastatin (ZOCOR) 10 MG tablet Take 10 mg by mouth daily.    Review of Systems  Constitutional: Negative.   HENT: Negative.   Eyes: Negative.   Respiratory: Negative.    Cardiovascular: Negative.   Gastrointestinal: Negative.   Endocrine: Negative.   Musculoskeletal: Negative.   Skin: Negative.   Allergic/Immunologic: Negative.   Neurological: Negative.   Hematological: Negative.   Psychiatric/Behavioral: Negative.   All other systems reviewed and are negative.   BP 99/60  Pulse 69  Ht 4\' 11"  (1.499 m)  Wt 94 lb 12 oz (42.978 kg)  BMI 19.13 kg/m2  Physical Exam  Nursing note and vitals reviewed. Constitutional: She is oriented to person, place, and time. She appears well-developed and well-nourished.  HENT:  Head: Normocephalic.  Nose: Nose normal.  Mouth/Throat: Oropharynx is clear and moist.  Eyes: Conjunctivae are normal. Pupils are equal, round, and reactive to light.  Neck: Normal range of motion. Neck supple. No JVD present.  Cardiovascular: Normal rate, S1 normal, S2 normal, normal heart sounds and intact distal pulses.  An irregularly irregular rhythm present. Exam reveals no gallop and no friction rub.   No murmur heard. Pulmonary/Chest: Effort normal and breath sounds normal. No respiratory distress. She has no wheezes. She has no rales. She exhibits no tenderness.  Abdominal: Soft. Bowel sounds are normal. She exhibits no distension. There is no tenderness.  Musculoskeletal: Normal range of motion. She exhibits no edema and no tenderness.  Lymphadenopathy:    She has no cervical adenopathy.  Neurological: She is alert and oriented to person, place, and time. Coordination normal.  Skin: Skin is warm and dry. No rash noted. No erythema.  Psychiatric: She has a normal mood and affect. Her behavior is normal. Judgment and thought content normal.    Assessment and Plan

## 2014-06-19 NOTE — Assessment & Plan Note (Signed)
Chronic atrial fibrillation, rate controlled. Not a candidate for anticoagulation given prior bleeding, underlying anemia

## 2014-06-19 NOTE — Assessment & Plan Note (Signed)
Recent admission to the hospital with acute on chronic diastolic CHF, likely exacerbated by her atrial fibrillation. No pleural effusions on exam today. Recommended she decrease the Lasix down to 40 mg daily. We have arranged for her to pick up a scale to weigh herself on a daily basis. This is available to the heart failure clinic at [facility]. We have recommended she restart her afternoon Lasix for weight gain of 3 or 4 pounds

## 2014-06-19 NOTE — Assessment & Plan Note (Signed)
She denies any recent GI bleed. Most recent hematocrit of 7

## 2014-06-19 NOTE — Assessment & Plan Note (Signed)
Most recent hemoglobin of 7. Currently on iron tabs. She denies any GI bleed

## 2014-06-19 NOTE — Patient Instructions (Addendum)
You are doing well.  Please decrease the furosemide down to one a day Weight daily If you have a 3 to 4 pound weight gain, Take an extra furosemide in the afternoon after lunch  Please call us if you have new issues that need to be addressed before your next appt.  Your physician wants you to follow-up in: 3 months.  You will receive a reminder letter in the mail two months in advance. If you don't receive a letter, please call our office to schedule the follow-up appointment.

## 2014-07-27 ENCOUNTER — Ambulatory Visit: Payer: Self-pay | Admitting: Family

## 2014-09-17 ENCOUNTER — Ambulatory Visit (INDEPENDENT_AMBULATORY_CARE_PROVIDER_SITE_OTHER): Payer: Medicare Other | Admitting: Cardiovascular Disease

## 2014-09-17 ENCOUNTER — Encounter: Payer: Self-pay | Admitting: Cardiovascular Disease

## 2014-09-17 VITALS — BP 120/58 | HR 68 | Ht 60.0 in | Wt 98.0 lb

## 2014-09-17 DIAGNOSIS — I4891 Unspecified atrial fibrillation: Secondary | ICD-10-CM

## 2014-09-17 DIAGNOSIS — I5032 Chronic diastolic (congestive) heart failure: Secondary | ICD-10-CM

## 2014-09-17 DIAGNOSIS — R0602 Shortness of breath: Secondary | ICD-10-CM

## 2014-09-17 NOTE — Assessment & Plan Note (Signed)
Relatively euvolemic on Lasix daily. No changes to her medication

## 2014-09-17 NOTE — Assessment & Plan Note (Signed)
Chronic atrial fibrillation, rate well controlled. Again discussed anticoagulation with her. She is not interested given her high risk of GI bleeding

## 2014-09-17 NOTE — Progress Notes (Signed)
Patient ID: Sharon Frazier, female    DOB: 1922-06-11, 80 y.o.   MRN: [id number]  HPI Comments: Sharon Frazier is a pleasant 80 year old woman with history of GI bleed, iron deficiency anemia, laser treatment of gastric angiodysplasias , peptic ulcer disease, GERD, diverticulosis and internal hemorrhoids, hypertension, hyperlipidemia, type 2 diabetes, previous history of tobacco abuse, right breast cancer status post mastectomy and chemotherapy with  hospital admission 12/11/2013 and discharged 12/14/2013 for new onset atrial fibrillation. she developed bronchitis with weakness and cough with sputum.  In the emergency room she was found to have atrial fibrillation with RVR. She was started on rate control medications and she was felt to be a poor candidate for anticoagulation. Unclear exactly how long she had been in atrial fibrillation.  She presents today for routine follow-up of her atrial fibrillation In general she reports that she is doing well. She has chronic left leg weakness Family reports she continues to be anemic. They report hemoglobin of 8. She takes 2 iron pills per day Occasionally has sharp stomach pains, followed by bowel movements. She takes her Lasix daily, walks with a cane, no recent falls Overall no new complaints  EKG on today's visit shows atrial fibrillation with ventricular rate 67 bpm  Other past medical history  in the hospital at the end of July 2015. She presented with shortness of breath and leg swelling. She had pleural effusion. She was started on Lasix with aggressive diuresis. Discharged on Lasix 40 mg twice a day. She was not on Lasix prior to her admission She did have thoracentesis on the right during her hospital stay. Discharge on 04/24/2014. She did have one unit packed red blood cells Prior echocardiogram with ejection fraction 65%.  In the hospital CT scan of the chest showing possible bronchopneumonia, moderate bilateral pleural effusions, right  greater than left, atherosclerosis of the coronary arteries, emphysema, cirrhosis, abdominal ascites  seen in the heart failure clinic 05/25/2014. No medication changes made at that time.  Echocardiogram showed ejection fraction 60-65%, mildly elevated right ventricular systolic pressures Notes indicate she had been seen by Dr. Vira Agar recently with EGD and electrocautery. She still had trace blood in her stool on followup visits with him. She was started on diltiazem 120 mg daily, amlodipine was held, she was started on metoprolol   Allergies  Allergen Reactions  . Alphagan [Brimonidine]     Arm numbness      Outpatient Encounter Prescriptions as of 09/17/2014  Medication Sig  . aspirin 81 MG tablet Take 81 mg by mouth daily.  Marland Kitchen diltiazem (CARDIZEM) 120 MG tablet Take 1 tablet (120 mg total) by mouth daily.  . dorzolamide (TRUSOPT) 2 % ophthalmic solution 1 drop 2 (two) times daily.  . furosemide (LASIX) 40 MG tablet Take 1 tablet (40 mg total) by mouth 2 (two) times daily as needed. (Patient taking differently: Take 40 mg by mouth daily. )  . glipiZIDE (GLUCOTROL XL) 2.5 MG 24 hr tablet Take 2.5 mg by mouth daily with breakfast.  . iron polysaccharides (NIFEREX) 150 MG capsule Take 150 mg by mouth 2 (two) times daily.  Marland Kitchen latanoprost (XALATAN) 0.005 % ophthalmic solution Place 1 drop into both eyes at bedtime.  Marland Kitchen lisinopril-hydrochlorothiazide (PRINZIDE,ZESTORETIC) 10-12.5 MG per tablet Take 1 tablet by mouth daily.  . metoprolol tartrate (LOPRESSOR) 25 MG tablet Take 1 tablet (25 mg total) by mouth 2 (two) times daily.  Marland Kitchen omeprazole (PRILOSEC) 20 MG capsule Take 20 mg by mouth 2 (two) times  daily before a meal.   . potassium chloride (K-DUR,KLOR-CON) 10 MEQ tablet Take 10 mEq by mouth 2 (two) times daily.  . [DISCONTINUED] simvastatin (ZOCOR) 10 MG tablet Take 10 mg by mouth daily.    Past Medical History  Diagnosis Date  . H/O: GI bleed   . GERD (gastroesophageal reflux disease)    . Diverticulosis   . HTN (hypertension)   . Hyperlipidemia   . Diabetes mellitus without complication   . PUD (peptic ulcer disease)   . Palpitations   . Breast cancer     right  . A-fib   . Glaucoma   . PVD (peripheral vascular disease)     Past Surgical History  Procedure Laterality Date  . Mastectomy      right breast   . Hip surgery      left hip  . Renal artery stent      Social History  reports that she quit smoking about 26 years ago. Her smoking use included Cigarettes. She has a 50 pack-year smoking history. She does not have any smokeless tobacco history on file. She reports that she does not drink alcohol or use illicit drugs.  Family History family history is not on file.   Review of Systems  HENT: Negative.   Respiratory: Negative.   Cardiovascular: Negative.   Gastrointestinal: Negative.   Musculoskeletal: Positive for gait problem.       Left leg weakness  Neurological: Positive for weakness.  Hematological: Negative.   Psychiatric/Behavioral: Negative.   All other systems reviewed and are negative.  BP 120/58 mmHg  Pulse 68  Ht 5' (1.524 m)  Wt 98 lb (44.453 kg)  BMI 19.14 kg/m2  Physical Exam  Constitutional: She is oriented to person, place, and time.  Thin, presenting in a wheelchair, pale  HENT:  Head: Normocephalic.  Nose: Nose normal.  Mouth/Throat: Oropharynx is clear and moist.  Eyes: Conjunctivae are normal. Pupils are equal, round, and reactive to light.  Neck: Normal range of motion. Neck supple. No JVD present.  Cardiovascular: Normal rate, regular rhythm, S1 normal, S2 normal, normal heart sounds and intact distal pulses.  Exam reveals no gallop and no friction rub.   No murmur heard. Pulmonary/Chest: Effort normal and breath sounds normal. No respiratory distress. She has no wheezes. She has no rales. She exhibits no tenderness.  Abdominal: Soft. Bowel sounds are normal. She exhibits no distension. There is no tenderness.   Musculoskeletal: Normal range of motion. She exhibits no edema or tenderness.  Lymphadenopathy:    She has no cervical adenopathy.  Neurological: She is alert and oriented to person, place, and time. Coordination normal.  Skin: Skin is warm and dry. No rash noted. No erythema.  Psychiatric: She has a normal mood and affect. Her behavior is normal. Judgment and thought content normal.    Assessment and Plan  Nursing note and vitals reviewed.

## 2014-09-17 NOTE — Patient Instructions (Signed)
You are doing well. No medication changes were made.  Please call us if you have new issues that need to be addressed before your next appt.  Your physician wants you to follow-up in: 6 months.  You will receive a reminder letter in the mail two months in advance. If you don't receive a letter, please call our office to schedule the follow-up appointment.   

## 2014-11-02 ENCOUNTER — Ambulatory Visit: Payer: Self-pay | Admitting: Family

## 2015-01-16 NOTE — H&P (Signed)
PATIENT NAME:  Sharon Frazier, Sharon Frazier MR#:  [id number] DATE OF BIRTH:  December 12, 1921  DATE OF ADMISSION:  04/20/2014  PRIMARY CARE PHYSICIAN: Dr. Morton Peters., MD.  PRIMARY CARDIOLOGIST:  Minna Merritts, MD.   REFERRING EMERGENCY ROOM PHYSICIAN:  Conni Slipper, MD.  CHIEF COMPLAINT: Shortness of breath and leg swelling.   HISTORY OF PRESENT ILLNESS: An 81 year old female with past medical history of atrial fibrillation, hypertension, diabetes, hyperlipidemia, gastroesophageal reflux disease, peptic ulcer disease, glaucoma, gastric angiectasia status post cauterization, diverticulosis, internal hemorrhoids, breast cancer and peripheral vascular disease, who lives with her son. She is able to walk with a cane. Gradually losing appetite and losing weight over the last few months, now for the last 1 week she started feeling more short of breath which is getting worse over the last 3 days, and she is not able to even sleep flat in bed at night.  She had to sit in a recliner. Her legs were more swollen around the ankle and she is feeling very short of breath with minimal exertion and so she decided to call Dr. Donivan Scull office and he suggested to go to her primary care doctor's office.  She went over there, Dr. Hardin Negus saw her and sent her into the Emergency Room for admission for CHF. On initial workup in the Emergency Room she was found to have elevated BNP level of 10,000 and chest x-ray showed some pulmonary congestion with edema around the ankles and use of accessory muscles for breathing and requiring supplemental oxygen, so she was given injection of Lasix and given to the hospitalist team for further admission for CHF.   REVIEW OF SYSTEMS:  CONSTITUTIONAL: Negative for fever. Positive of fatigue and weakness, has complaint of weight loss. Did not measure how much.  EYES: No blurring, double vision, discharge or redness.  ENT: No tinnitus, ear pain or hearing loss.  RESPIRATORY: The patient  has a dry cough. No wheezing but has shortness of breath.  CARDIOVASCULAR: No chest pain, but has orthopnea, edema. No arrhythmia or palpitations.  GASTROINTESTINAL: No nausea, vomiting, diarrhea, abdominal pain.  GENITOURINARY: No dysuria, hematuria, or increased frequency.  ENDOCRINE: No nocturia. No heat or cold intolerance. No excessive sweating. SKIN: No acne, rashes, or lesions on the skin.  MUSCULOSKELETAL: No pain or swelling in the joints, but has some swelling around ankles on both sides.  NEUROLOGICAL: No numbness, weakness, tremor or vertigo.  PSYCHIATRIC: No anxiety, insomnia, bipolar disorder.   PAST MEDICAL HISTORY:  1.  Atrial fibrillation.  2.  Hypertension.  3.  Diabetes.  4.  Hyperlipidemia.  5.  Gastroesophageal reflux disease. 6.  Peptic ulcer disease, coma.  7.  Glaucoma.  8.  Gastric angiectasia status post cauterization.  9.  History of diverticulosis.  10. Internal hemorrhoids breast cancer.  11. Breast cancer.  12. Peripheral vascular disease.   PAST SURGICAL HISTORY: Right total mastectomy and left hip surgery. Stent in the lower extremity.  SOCIAL HISTORY: The patient does not smoke. She denies drinking and denies any illegal drug use. 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 She used to live alone but after admission the last time in the hospital she moved in with her son.   FAMILY HISTORY: Positive for colon cancer in her sister.   HOME MEDICATIONS:  1.  Simvastatin 10 mg oral once a day.  2.  Omeprazole 20 mg oral 2 times a day.  3.  Metoprolol 25 mg oral 2 times a day.  4.  Latanoprost 0.005% ophthalmic solution in each eye at nighttime.  5.  Hydrochlorothiazide plus lisinopril 12.5/10 mg oral tablet once a day.  6.  Glipizide XL 2.5 mg extended-release once a day.  7.  Ferrex 150 mg oral capsule 2 times a day.  8.  Ergocalciferol 50,000 international units once a week.  9.  Cardizem 120 mg extended-release  capsule once a day.  10. Aspirin 81 mg oral tablet once a day.   PHYSICAL EXAMINATION:  VITAL SIGNS: In ER, temperature 97.7, pulse is 84, respirations 18, blood pressure 163/75, and pulse oximetry is 100% is in the room air.  GENERAL: The patient is fully alert and oriented to time, place, and person she appears thin and pale.  HEENT: Head and neck atraumatic. Conjunctivae pale. Oral mucosa moist.  NECK: Supple. No JVD.  RESPIRATORY: Bilateral equal air entry. Crepitation present, somewhat decreased air entry in lower lobes.  CARDIOVASCULAR: S1, S2 present, irregular, no murmur appreciated.  ABDOMEN: Soft, nontender. Bowel sounds present. No organomegaly felt.  MUSCULOSKELETAL:  Joints: No swelling or tenderness. Legs: Bilateral pitting edema present around ankles. No pain or swelling in the joints.  SKIN: No acne, rashes, or lesions.  NEUROLOGICAL: No numbness. The patient has generalized weakness 4/5 in all limbs. No tremor or rigidity. Follows commands.  PSYCHIATRIC: Does not appear to have any acute psychiatric illness at this time.   IMPORTANT LABORATORY RESULTS: Glucose 140, BNP 10,312, BUN 24, creatinine 0.70, sodium 143, potassium is 3.5, chloride 109 and CO2 31, calcium is 9.3, total protein 6.8, albumin 3.4, bilirubin 0.6, alkaline phosphatase 73, SGOT 22 and SGPT 17, WBC 6.1, hemoglobin 7.1, platelet count 187,000, MCV 63, prothrombin time 15.2. INR 1.2,   Echocardiogram was done on May 16 which showed ejection fraction 60-65%, normal global left ventricular systolic function, normal right ventricular size and systolic function, mild dilated left atrium, mild elevated pulmonary artery systolic pressure.   ASSESSMENT AND PLAN: An 81 year old female with multiple past medical histories came to hospital with worsening leg edema and shortness of breath, has elevated BNP, some pulmonary edema on chest x-ray and is using accessory muscles of respiration.  1.  Acute diastolic congestive  heart failure. Echocardiogram was done 2 months ago which showed ejection fraction of 65%, so most likely this is diastolic heart failure. We will admit her to telemetry floor, give IV Lasix and follow output measurements. Will put her on a low salt diet.  2.  History of atrial fibrillation. The patient is taking aspirin, Cardizem extended-release. We will continue the same.  3.  Hypertension. We will continue the hydrochlorothiazide, lisinopril and metoprolol as she is taking at home.  4.  Diabetes: We will continue glipizide and put her on sliding scale insulin coverage.  5.  Anemia, which appears to be iron deficiency anemia, and weight loss will do stool guaiac study, though it was negative in the past, and we will put her on replacement.  6.  Complaint of weight loss with chest x-ray that is not clear-looking, with pleural effusion. We will also do a CT scan of the chest with contrast to rule out any occult malignancy as she has complaint of weight loss.   CODE STATUS:  The patient is DNR.  Her medical power of attorney is her son and she has made clear her wishes to me and she also has a living will concerning her wishes not to have resuscitation or  to stay alive on machines.   Total time spent during this admission: 50 minutes.    ____________________________ Ceasar Lund Anselm Jungling, MD vgv:lt D: 04/20/2014 13:26:38 ET T: 04/20/2014 14:18:46 ET JOB#: [id number]  cc: Ceasar Lund. Anselm Jungling, MD, <Dictator> Vaughan Basta MD ELECTRONICALLY SIGNED 04/21/2014 16:16

## 2015-01-16 NOTE — H&P (Signed)
PATIENT NAME:  Sharon Frazier, Sharon Frazier MR#:  [id number] DATE OF BIRTH:  09-Aug-1922  DATE OF ADMISSION:  12/11/2013  PRIMARY CARE PHYSICIAN:  Dr. Hardin Negus.  CHIEF COMPLAINT: Weakness and palpitation for 1 week.   HISTORY OF PRESENT ILLNESS: An 81 year old Caucasian female with a history of hypertension, diabetes, hyperlipidemia was sent to ED from home due to weakness and palpitation for 1 week. The patient is alert, awake, oriented, in no acute distress. The patient only complains of generalized weakness and the palpitations for the past 1 week. She denies any chest pain, diaphoresis or abdominal pain, nausea, vomiting or diarrhea. The patient denies any fever or chills. The patient was noted to have new afib with RVR at 125 and was treated with Cardizem. Heart rate decreased to 88.   PAST MEDICAL HISTORY: Hypertension, diabetes, hyperlipidemia, GERD, PUD, glaucoma, gastric angioectasias, status post coagulation, history of laser treatment of  angioectasias in 2007, history of diverticulosis and internal hemorrhoid, history of right breast cancer status post mastectomy and chemo.   PAST SURGICAL HISTORY: Right mastectomy, left hip surgery due to fracture, stent in bilateral lower extremities.   SOCIAL HISTORY: No smoking, alcohol drinking or illicit drugs.   FAMILY HISTORY: Sister has colon cancer.   ALLERGIES: ALPHAGAN.  HOME MEDICATIONS: 1.  Zocor 10 mg p.o. at bedtime. 2.  Omeprazole 20 mg p.o. b.i.d. 3.  Latanoprost 0.005% one drop to each eye daily.  4.  HCTZ/lisinopril 12.5 mg/10 mg p.o. tablets 1 tab. p.o. daily.  5.  Glipizide XL 5 mg p.o. daily.  6.  Ferrex 150 oral capsule 1 cap b.i.d.  7.  Dorzolamide 2% ophthalmic solution 1 drop to each eye twice a day.  8.  Norvasc 5 mg p.o. daily.   REVIEW OF SYSTEMS: CONSTITUTIONAL: The patient denies any fever or chills. No headache or dizziness, but has generalized weakness. EYES:  No double vision or blurry vision. ENT: No postnasal drip,  slurred speech or dysphagia.  CARDIOVASCULAR: No chest pain but has palpitations and no orthopnea, nocturnal dyspnea. No leg edema.  PULMONARY: No cough, sputum, shortness of breath or hemoptysis.  GASTROINTESTINAL: No abdominal pain, nausea, vomiting, diarrhea. No melena or bloody stool.  GENITOURINARY: No dysuria, hematuria or incontinence.  SKIN: No rash or jaundice.  NEUROLOGY: No syncope, loss of consciousness or seizure.  ENDOCRINE: No polyuria, polydipsia, heat or cold intolerance.  HEMATOLOGY: No easy bruising or bleeding.   PHYSICAL EXAMINATION: VITAL SIGNS:  Temperature 98, blood pressure 152/72, pulse 127, oxygen saturation 95% on oxygen.  GENERAL: The patient is alert, awake, oriented, in no acute distress.  HEENT: Pupils round, equal and reactive to light and accommodation. NECK:  Supple. No JVD or carotid bruits are noted. No lymphadenopathy. No thyromegaly. Moist oral mucosa.  Clear oropharynx.  CARDIOVASCULAR: S1, S2 irregular rate. Tachycardia.  No murmurs or gallops. PULMONARY: Bilateral air entry. No wheezing or rales. No use of accessory muscle to breathe.  ABDOMEN: Soft. No distention or tenderness. No organomegaly. Bowel sounds present.  EXTREMITIES: No edema, clubbing or cyanosis. No calf tenderness. Bilateral pedal pulses present.  SKIN: No rash or jaundice.  NEUROLOGY: AO x 3. No focal deficit. Power 5/5. Sensation intact.   LABORATORY, DIAGNOSTIC AND RADIOLOGICAL DATA:  Urinalysis is negative.  Abdominal ultrasound: No evidence of gallstones, no dilatation or acute findings.   Chest x-ray: No active disease.  WBC 3.7, hemoglobin 8.4, platelets 201. Glucose 171, BUN 19, creatinine 0.93. Sodium 139, potassium 3.1, chloride 106, bicarb 28. Lipase 277,  magnesium 2.1.  EKG showed afib at 125 bpm.  IMPRESSIONS: 1.  New onset atrial fibrillation. 2.  Hypertension malignancy.  3.  Hypokalemia, anemia, diabetes.  4.  History of peptic ulcer disease.   PLAN OF  TREATMENT: 1.  The patient will be admitted to telemetry floor. We will start Lovenox b.i.d. The patient was treated with IV Cardizem. Since the patient's heart rate was 88, I started p.o. Cardizem. However, the patient's heart rate increased to 127. We will monitor and give IV Cardizem p.r.n. May start Cardizem drip p.r.n.  2.  We will get an echocardiograph and a cardiology consult.  3.  I discussed with the patient and the patient's husband about anticoagulation medication, the benefit and the side effect of Coumadin, Xarelto, Eliquis and Pradaxa. I will defer to Dr. Rockey Situ for anticoagulation decision. The patient is 81 years old, probably needs aspirin. 4.  The patient now is on Lovenox. Since the patient has anemia and PUD history, we need to monitor hemoglobin closely. Watch for bleeding.  5.  For diabetes, we will start sliding scale.  6.  I discussed the patient's condition and plan of treatment with the patient and the patient's husband.   TIME SPENT: About 62 minutes.  ____________________________ Demetrios Loll, MD qc:ce D: 12/11/2013 16:58:28 ET T: 12/11/2013 18:54:56 ET JOB#: [id number]  cc: Demetrios Loll, MD, <Dictator> Demetrios Loll MD ELECTRONICALLY SIGNED 12/12/2013 14:51

## 2015-01-16 NOTE — Discharge Summary (Signed)
Dates of Admission and Diagnosis:  Date of Admission 05-Feb-2014   Date of Discharge 07-Feb-2014   Admitting Diagnosis weakness   Final Diagnosis 1. Acute right temporoparietal CVA 2. Afib with RVR 3. Possible cirrhosis 4. h/o PUD, AV malformations and h/o GI bleed 5. HTN    Chief Complaint/History of Present Illness REASON FOR ADMISSION: Weakness.  HISTORY OF PRESENT ILLNESS:  This is a very nice 81 year old female who has history of recent admission on 12/11/2013 due to new onset atrial fibrillation. At that moment, the patient was discharged with medications for rate control, diltiazem and metoprolol. The patient was not put on anticoagulation due to previous history of GI bleeding.  The patient comes today with a history of feeling weak for 1 week. She has difficulty walking that has been progressing since then. The patient has soreness on her rib cage due to her position whenever she walks and slowly this has been getting worse to the point that she could not even get up to the bathroom today. The patient has no appetite, has not been eating or drinking very much. The patient denies any singular incident that she can recall of weakness with slurred speech, headaches or any similar symptoms. The patient states that this is just going on for a week, getting worse. Whenever she checked in with her primary care physician, he recommended to be seen in the ER and her cardiologist thought that she might have some issues that could be related to side effects of her medications.  The patient actually has history of anemia. In the past month of March, her hemoglobin was 7.7. Today it is actually much better; it is 10.   Allergies:  Alphagan: Unknown  Rhogam:  15-May-15 07:32   Notification Eulis Manly  Notification Date 02-06-2014  Notification Time 1103 (Result(s) reported on 06 Feb 2014 at 11:08AM.)  Thyroid:  15-May-15 03:28   Thyroid Stimulating Hormone 0.531  (0.45-4.50 (International Unit)  ----------------------- Pregnant patients have  different reference  ranges for TSH:  - - - - - - - - - -  Pregnant, first trimetser:  0.36 - 2.50 uIU/mL)  LabObservation:  16-May-15 09:17   OBSERVATION Reason for Test  Hepatic:  16-May-15 12:21   Bilirubin, Total 0.4  Bilirubin, Direct 0.1 (Result(s) reported on 07 Feb 2014 at 01:20PM.)  Alkaline Phosphatase 87 (45-117 NOTE: New Reference Range 08/15/13)  SGPT (ALT) 12  SGOT (AST)  14  Total Protein, Serum 7.1  Albumin, Serum  3.2  Routine BB:  14-May-15 12:55   ABO Group + Rh Type O Positive  Antibody Screen POSITIVE (Result(s) reported on 06 Feb 2014 at 07:17AM.)  15-May-15 07:32   Antibody 1 Anti- E  General Ref:  14-May-15 12:55   Vitamin D, 25-Hydroxy, Serum ========== TEST NAME ==========  ========= RESULTS =========  = REFERENCE RANGE =  VITAMIN D  Vitamin D, 25-Hydroxy Vitamin D, 25-Hydroxy           [L  17.2 ng/mL           ]        30.0-100.0 Vitamin D deficiency has been defined by the [place] practice guideline as a level of serum 25-OH vitamin D less than 20 ng/mL (1,2). The Endocrine Society went on to further define vitamin D insufficiency as a level between 21 and 29 ng/mL (2). 1. IOM (Institute of Medicine). 2010. Dietary reference    intakes for calcium and D. [place]:  The    [employer]. 2. Holick MF, Binkley Maiden, Bischoff-Ferrari HA, et al.    Evaluation, treatment, and prevention of vitamin D    deficiency: an Endocrine Society clinical practice    guideline. JCEM. 2011 Jul; 96(7):1911-30.               [facility]            No: [id number]           [address], [place], [place] [address]           Lindon Romp, MD         [phone]   Result(s) reported on 06 Feb 2014 at 07:47AM.  Cardiology:  16-May-15 09:17   Echo Doppler REASON FOR EXAM:     COMMENTS:     PROCEDURE: [facility] -  ECHO DOPPLER COMPLETE(TRANSTHOR)  - Feb 07 2014  9:17AM   RESULT: Echocardiogram Report  Patient Name:   Sharon Frazier Date of Exam: 02/07/2014 Medical Rec #:  [id number]              Custom1: Date of Birth:  [birth year]          Height:       59.0 in Patient Age:    89 years            Weight:       96.0 lb Patient Gender: F                   BSA:          1.35 m??  Indications: CVA Sonographer:    Janalee Dane RCS Referring Phys: James Ivanoff, ROBERTO  Summary:  1. No source of CVA noted. Rhythm is atrial fibrillation.  2. Left ventricular ejection fraction, by visual estimation, is 60 to  65%.  3. Normal global left ventricular systolic function.  4. Normal right ventricular size and systolic function.  5. Mildly dilated left atrium.  6. Mild tricuspid regurgitation.  7. Mildly elevated pulmonary artery systolic pressure. 2D AND M-MODE MEASUREMENTS (normal ranges within parentheses): Left Ventricle:       Normal IVSd (2D):      0.98 cm (0.7-1.1) LVPWd (2D):     1.06 cm (0.7-1.1) Aorta/LA:                  Normal LVIDd (2D):     3.54 cm (3.4-5.7) Aortic Root (2D): 2.90 cm (2.4-3.7) LVIDs (2D):     2.00 cm           Left Atrium (2D): 4.40 cm (1.9-4.0) LV FS (2D):     43.5 %   (>25%) LV EF (2D):     75.7 %   (>50%)                                   Right Ventricle:                                   RVd (2D):        2.94 cm SPECTRAL DOPPLER ANALYSIS (where applicable): Tricuspid Valveand PA/RV Systolic Pressure: TR Max Velocity: 2.75 m/s RA  Pressure: 5 mmHg RVSP/PASP: 35.2 mmHg  PHYSICIAN INTERPRETATION: Left Ventricle: The left ventricular internal cavity size was normal. LV  posterior wall thickness was normal. No left ventricular hypertrophy.  Global LV systolic function was normal. Left ventricular ejection  fraction, by visual estimation, is 60 to 65%. Right Ventricle: Normal right ventricular size, wall thickness, and  systolic function. The right ventricular  size is normal. Global RV  systolic function is normal. Left Atrium: The left atrium is mildly dilated. Right Atrium: The right atrium is normal in size. Pericardium: There is no evidence of pericardial effusion. Mitral Valve: The mitral valve isnormal in structure. Trace mitral valve  regurgitation is seen. Tricuspid Valve: The tricuspid valve is normal. Mild tricuspid  regurgitation is visualized. The tricuspid regurgitant velocity is 2.75  m/s, and with an assumed right atrial pressure of 5 mmHg, the estimated  right ventricular systolic pressure is mildly elevated at 35.2 mmHg. Aortic Valve: Mild aortic valve sclerosis is present, with no evidence of  aortic valve stenosis. Pulmonic Valve: The pulmonic valve is normal. Trace pulmonic valve   regurgitation. Aorta: The aortic root and ascending aorta are structurally normal, with  no evidence of dilitation.  [id number] Ida Rogue MD Electronically signed by [id number] Ida Rogue MD Signature Date/Time: 02/07/2014/2:32:44 PM  *** Final ***  IMPRESSION: .    Verified By: Minna Merritts, M.D., MD  Routine Chem:  14-May-15 12:55   Iron Binding Capacity (TIBC)  510  Unbound Iron Binding Capacity 489  Iron, Serum  21  Iron Saturation 4 (Result(s) reported on 05 Feb 2014 at 07:42PM.)  Glucose, Serum  114  BUN 17  Creatinine (comp) 0.87  Sodium, Serum 138  Potassium, Serum 3.5  Chloride, Serum 105  CO2, Serum 25  Calcium (Total), Serum 10.0  Anion Gap 8  Osmolality (calc) 278  eGFR (African American) >60  eGFR (Non-African American)  59 (eGFR values <14m/min/1.73 m2 may be an indication of chronic kidney disease (CKD). Calculated eGFR is useful in patients with stable renal function. The eGFR calculation will not be reliable in acutely ill patients when serum creatinine is changing rapidly. It is not useful in  patients on dialysis. The eGFR calculation may not be applicable to patients at the low and high extremes of  body sizes, pregnant women, and vegetarians.)  15-May-15 03:28   Lipase 257 (Result(s) reported on 06 Feb 2014 at 10:10AM.)  Iron Binding Capacity (TIBC) 395  Unbound Iron Binding Capacity 381  Iron, Serum  14  Iron Saturation 4 (Result(s) reported on 06 Feb 2014 at 08:24AM.)  Ferritin ([facility] 10 (Result(s) reported on 06 Feb 2014 at 08:28AM.)  Result Comment HEMOGLOBIN/HEMATOCRIT - RESULTS VERIFIED BY REPEAT TESTING.  Result(s) reported on 06 Feb 2014 at 04:45AM.  Glucose, Serum 98  BUN 15  Creatinine (comp) 0.74  Sodium, Serum 142  Potassium, Serum 3.6  Chloride, Serum  110  CO2, Serum 26  Calcium (Total), Serum 9.1  Anion Gap  6  Osmolality (calc) 284  eGFR (African American) >60  eGFR (Non-African American) >60 (eGFR values <672mmin/1.73 m2 may be an indication of chronic kidney disease (CKD). Calculated eGFR is useful in patients with stable renal function. The eGFR calculation will not be reliable in acutely ill patients when serum creatinine is changing rapidly. It is not useful in  patients on dialysis. The eGFR calculation may not be applicable to patients at the low and high extremes of body sizes, pregnant women, and vegetarians.)  Magnesium, Serum 2.0 (1.8-2.4 THERAPEUTIC RANGE: 4-7 mg/dL TOXIC: > 10 mg/dL  -----------------------)  Hemoglobin A1c ([facility])  6.6 (The American Diabetes Association recommends that a primary goal of therapy should be <7%  and that physicians should reevaluate the treatment regimen in patients with HbA1c values consistently >8%.)  Cholesterol, Serum 101  Triglycerides, Serum 75  HDL (INHOUSE)  38  VLDL Cholesterol Calculated 15  LDL Cholesterol Calculated 48 (Result(s) reported on 06 Feb 2014 at 04:47AM.)  Cardiac:  14-May-15 12:55   Troponin I < 0.02 (0.00-0.05 0.05 ng/mL or less: NEGATIVE  Repeat testing in 3-6 hrs  if clinically indicated. >0.05 ng/mL: POTENTIAL  MYOCARDIAL INJURY. Repeat  testing in 3-6 hrs if  clinically  indicated. NOTE: An increase or decrease  of 30% or more on serial  testing suggests a  clinically important change)  Routine UA:  14-May-15 16:40   Color (UA) Amber  Clarity (UA) Hazy  Glucose (UA) Negative  Bilirubin (UA) Negative  Ketones (UA) 1+  Specific Gravity (UA) 1.021  Blood (UA) Negative  pH (UA) 5.0  Protein (UA) 100 mg/dL  Nitrite (UA) Negative  Leukocyte Esterase (UA) Trace (Result(s) reported on 05 Feb 2014 at 04:59PM.)  RBC (UA) 1 /HPF  WBC (UA) 5 /HPF  Bacteria (UA) NONE SEEN  Epithelial Cells (UA) 3 /HPF  Mucous (UA) PRESENT (Result(s) reported on 05 Feb 2014 at 04:59PM.)  Routine Sero:  15-May-15 13:41   Occult Blood, Feces NEGATIVE (Result(s) reported on 06 Feb 2014 at 02:38PM.)  Routine Hem:  14-May-15 12:55   Hemoglobin (CBC)  10.0  WBC (CBC) 9.2  RBC (CBC) 4.99  Hematocrit (CBC)  31.0  Platelet Count (CBC) 222  MCV  62  MCH  20.1  MCHC 32.4  RDW  18.2  Neutrophil % 81.4  Lymphocyte % 10.5  Monocyte % 7.4  Eosinophil % 0.1  Basophil % 0.6  Neutrophil #  7.5  Lymphocyte # 1.0  Monocyte # 0.7  Eosinophil # 0.0  Basophil # 0.1 (Result(s) reported on 05 Feb 2014 at 02:19PM.)  15-May-15 03:28   Hemoglobin (CBC)  7.5  WBC (CBC) 6.3  RBC (CBC) 4.15  Hematocrit (CBC)  25.9  Platelet Count (CBC) 172  MCV  62  MCH  18.0  MCHC  28.8  RDW  17.7  Neutrophil % 74.8  Lymphocyte % 13.3  Monocyte % 9.7  Eosinophil % 1.1  Basophil % 1.1  Neutrophil # 4.7  Lymphocyte #  0.8  Monocyte # 0.6  Eosinophil # 0.1  Basophil # 0.1    17:34   Hemoglobin (CBC)  7.8 (Result(s) reported on 06 Feb 2014 at 06:12PM.)   PERTINENT RADIOLOGY STUDIES: [place]:    15-May-15 15:03, US Abdomen Limited Survey  US Abdomen Limited Survey   REASON FOR EXAM:    ruq pain  COMMENTS:   Body Site: Gallbladder, Liver, Common Bile Duct    PROCEDURE: [place]  - US ABDOMEN LIMITED SURVEY  - Feb 06 2014  3:03PM     CLINICAL DATA:  Right upper quadrant abdominal  pain.    EXAM:  US ABDOMEN LIMITED - RIGHT UPPER QUADRANT    COMPARISON:  Abdominal ultrasound 3/ 19/15.    FINDINGS:  Gallbladder No focal mass lesion is evident. Normal echogenicity is  present.  No gallstones or wall thickening visualized. No sonographic Murphy  sign noted. The maximal wall thickness is 1.3 mm, within normal  limits.    Common bile duct:    Diameter: 1.9 mm, within normal limits.    Liver:    An irregular capsular contour is again noted. No focal lesions are  present. The liver is mildly hyperechoic.    A small  amount of free fluid is noted about the liver.   IMPRESSION:  1. Irregular capsular contour and slight increased echogenicity is  again noted, concerning for early cirrhosis.  2. No focal hepatic lesion.  3. No acute abnormality.  Normal appearance of the gallbladder.      Electronically Signed    By: Lawrence Santiago M.D.    On: 02/06/2014 15:12         Verified By: Resa Miner. MATTERN, M.D.,    16-May-15 08:46, US Carotid Doppler Bilateral  US Carotid Doppler Bilateral   REASON FOR EXAM:    cva  COMMENTS:       PROCEDURE: [place]  - US CAROTID DOPPLER BILATERAL  - Feb 07 2014  8:46AM     CLINICAL DATA:  Stroke    EXAM:  BILATERAL CAROTID DUPLEX ULTRASOUND    TECHNIQUE:  Pearline Cables scale imaging, color Doppler and duplex ultrasound were  performed of bilateral carotid and vertebral arteries in the neck.    COMPARISON:  None.  FINDINGS:  Criteria: Quantification of carotid stenosis is based on velocity  parameters that correlate the residual internal carotid diameter  with NASCET-based stenosis levels, using the diameter of the distal  internal carotid lumen as the denominator for stenosis measurement.    The following velocity measurements were obtained:    RIGHT    ICA:  84 cm/sec    CCA:  77 cm/sec    SYSTOLIC ICA/CCA RATIO:  1.1  DIASTOLIC ICA/CCA RATIO:    ECA:  90 were cm/sec    LEFT    ICA:  153 cm/sec    CCA:  112  cm/sec    SYSTOLIC ICA/CCA RATIO:  1.4    DIASTOLIC ICA/CCA RATIO:    ECA:  70 cm/sec  RIGHT CAROTID ARTERY: Minimal plaque in the bulb. Low resistance  internal carotid Doppler pattern. Rhythm is somewhat irregular.    RIGHT VERTEBRAL ARTERY:  Antegrade.  Normal Doppler pattern.    LEFT CAROTID ARTERY: Mild plaque in the bulb. Low resistance  internal carotid Doppler pattern. Irregular rhythm. The vessel is  tortuous.    LEFT VERTEBRAL ARTERY:  Antegrade.  Normal Doppler pattern.     IMPRESSION:  Less than 50% stenosis in the right internal carotid artery. Minimal  plaque in the bulb.  The left internal carotid is tortuous. The elevated velocity likely  reflects tortuosity and estimated stenosis is felt to be less than  50%.    Irregular rhythm. Consider atrial fibrillation and correlate with  electrocardiogram.      Electronically Signed    By: Maryclare Bean M.D.    On: 02/07/2014 09:21         Verified By: Jamas Lav, M.D.,  MRI:    15-May-15 16:41, MRI Brain Without Contrast  MRI Brain Without Contrast   REASON FOR EXAM:    weakness. CVA on CT head?  COMMENTS:       PROCEDURE: MR  - MR BRAIN WO CONTRAST  - Feb 06 2014  4:41PM     CLINICAL DATA:  Stroke.  Weakness.    EXAM:  MRI HEAD WITHOUT CONTRAST    TECHNIQUE:  Multiplanar, multiecho pulse sequences of the brain and surrounding  structures were obtained without intravenous contrast.    COMPARISON:  CT head 02/05/2014  FINDINGS:  Acute infarct right temporoparietal lobe measuring approximately 3 x  4 cm. No associated hemorrhage. No other acuteinfarct.    Cerebral volume is normal for age. Chronic microvascular  ischemic  changes in the white matter, typical for age. Negative for mass  lesion. No shift of the midline structures. Pituitary is normal in  size.     IMPRESSION:  Acute infarct right temporoparietal lobe.    Mild chronic microvascular ischemic change in the white  matter.    Electronically Signed    By: Franchot Gallo M.D.    On: 02/06/2014 16:52         Verified By: Truett Perna, M.D.,  CT:    14-May-15 17:05, CT Head Without Contrast  CT Head Without Contrast   REASON FOR EXAM:    Weakness  COMMENTS:       PROCEDURE: CT  - CT HEAD WITHOUT CONTRAST  - Feb 05 2014  5:05PM     CLINICAL DATA:  Weakness.    EXAM:  CT HEAD WITHOUT CONTRAST    TECHNIQUE:  Contiguous axial images were obtained from the base of the skull  through the vertex without intravenous contrast.    COMPARISON:  None.  FINDINGS:  No intracranial hemorrhage.    Hypodensity posterior right operculum region/posterior right frontal  lobe most likely related to prior ischemia/infarct. There issparing  of the overlying gray matter. Vasogenic edema felt to be less likely  consideration for this appearance however if the patient has  progressive symptoms this could be re-evaluated on followup imaging.    No intracranial mass lesion seen separate from above described  findings.    Global atrophy without hydrocephalus.    Vascular calcifications.   IMPRESSION:  No intracranial hemorrhage.    Hypodensity posterior right operculum region/posterior right frontal  lobe most likely related to prior ischemia/infarct. There is sparing  of the overlying gray matter. Vasogenic edema felt to be less likely  consideration for this appearance however if the patient has  progressive symptoms this could be re-evaluated on followup imaging.    Globalatrophy without hydrocephalus.      Electronically Signed    By: Chauncey Cruel M.D.    On: 02/05/2014 17:08     Verified By: Doug Sou, M.D.,   Pertinent Past History:  Pertinent Past History PAST MEDICAL HISTORY: 1.  New onset atrial fibrillation.  2.  Hypertension.  3.  Diabetes.  4.  Hyperlipidemia.  5.  GERD. 6.  Peptic ulcer disease.  7.  Glaucoma.  8.  History of gastric angiectasia status post  catheterization. 9.  History of diverticulosis.  10.  Internal hemorrhoids.  11.  Breast cancer.  12.  Peripheral vascular disease.   Hospital Course:  Hospital Course 81yo female with PMHx s/f h/o GIB, iron deficiency anemia, requiring laser treatment of gastric angiectasias, PUD, GERD, h/o diverticulosis and internal hemorrhoids, chronic atrial fibrillation, presenting with weakness  * Afib with RVR Likely from holding her cardizem and metoprolol Restarted home meds. Increase cardizem to 180 mg D/C Digoxin. No anti coagulation du to concern fro GI bleed. Discussed with Dr. Rockey Situ  * Acute Right temporoparital CVA ASA, Statin. Likely thromboembolic from Afib. But no full anti coagulation due to h/o GI bleed. Also would not start anti-coagulation acutely after CVA. Discussed with Dr. Rockey Situ who will f/u as OP  * Abd pain Mild vague Epigastric, LUQ, RUQ [place] abd showed possible cirrhosis but no history. f/u with PCP  * Microcytic anemia Stable copared to last admission Continue Iron. Stool occult negative  * Weakness Caould be from intermittent Afib. But she also has B12/Vitamin D deficiency that could be contributing. Started  replacement  On Day of d/c S1, S2 Irregular Lungs CTA Abd- Soft, NT, BS present  Ambulated with PT  Time spent on d/c 45 minutes   Condition on Discharge Fair   Code Status:  Code Status Full Code   DISCHARGE INSTRUCTIONS HOME MEDS:  Medication Reconciliation: Patient's Home Medications at Discharge:     Medication Instructions  ferrex-150 oral capsule  1 cap(s) orally 2 times a day   omeprazole 20 mg oral delayed release capsule  1 cap(s) orally 2 times a day   hydrochlorothiazide-lisinopril 12.5 mg-10 mg oral tablet  1 tab(s) orally once a day (in the morning)   simvastatin 10 mg oral tablet  1 tab(s) orally once a day (at bedtime)   dorzolamide 2% ophthalmic solution  1 drop(s) to each eye 2 times a day   latanoprost 0.005%  ophthalmic solution  1 drop(s) to each eye once a day (at bedtime)   glipizide xl 2.5 mg oral tablet, extended release  1 tab(s) orally once a day   aspirin 81 mg oral tablet, chewable  1 tab(s) orally once a day   metoprolol tartrate 25 mg oral tablet  1 tab(s) orally 2 times a day   diltiazem 120 mg/12 hours oral capsule, extended release  1 cap(s) orally once a day   ergocalciferol 50,000 intl units (1.25 mg) oral capsule  1 cap(s) orally once a week   cyanocobalamin 1000 mcg sublingual tablet  2 tab(s) sublingual once a day   ultram 50 mg oral tablet  1 tab(s) orally 2 times a day, As Needed - for Pain     Physician's Instructions:  Home Health? Yes   Home Health Service Physicial Therapy   Diet Low Sodium  Low Fat, Low Cholesterol  Carbohydrate Controlled (ADA) Diet   Activity Limitations As tolerated   Return to Work Not Applicable   Time frame for Follow Up Appointment 1-2 weeks  Dr. Rockey Situ   Time frame for Follow Up Appointment 1-2 weeks  PCP   Electronic Signatures: Alba Destine (MD)  (Signed [phone] 16:23)  Authored: ADMISSION DATE AND DIAGNOSIS, CHIEF COMPLAINT/HPI, Allergies, PERTINENT LABS, PERTINENT RADIOLOGY STUDIES, [place] MED[facility], PATIENT INSTRUCTIONS   Last Updated: 20-May-15 16:23 by Alba Destine (MD)

## 2015-01-16 NOTE — Consult Note (Signed)
General Aspect Sharon Frazier is a very pleasant 81yo female with PMHx s/f h/o GIB, iron deficiency anemia, requiring laser treatment of gastric angiectasias, PUD, GERD, h/o diverticulosis and internal hemorrhoids, new atrial fibrillation, presenting with wekness, ABD discomfort upper epigastric x 1 week. Cardiology was consulted for atrial fibrillation, medical management.  She has a hx of HTN, HLD, DM2, h/o tobacco abuse, R breast CA s/p mastectomy & chemotherapy, admitted to [facility] in March of this year due to weakness, fatigue and palpitations. She was found to be in afib with RVR.  She was started on AVN blocking agents and was noted to have intermittent 2:1 atrial flutter as well.  She was felt to be a poor anticoagulation candidate despite a CHA2DS2VASc of 5 2/2 prior h/o GIB's and microcytic anemia.  LV fxn was nl on echo.  She was subsequently discharged with plan for rate-control.    She was seen back in clinic in early April @ which time she was doing reasonably well in rate-controlled afib @ 73 bpm.Tolerating the meds as of a week ago. Acute onset of malaise, ABD discomfort, wekaness. Seen by Dr. Hardin Negus. Medication changes made (unsure of details).  Overthe past week with continued weakness, ABD discomfort, very tender to movement and palpations. B/L flanks in particular. Left >right   Present Illness . SOCIAL HISTORY: The patient does not smoke, does not drink at this moment. She does not use drugs.   FAMILY HISTORY: Positive for colon cancer in her sister.   ALLERGIES: ALPHAGAN.   Physical Exam:  GEN well developed, well nourished, no acute distress   HEENT hearing intact to voice, moist oral mucosa   NECK supple   RESP normal resp effort  clear BS   CARD Irregular rate and rhythm  No murmur   ABD denies tenderness  soft   LYMPH negative neck   EXTR negative edema   SKIN normal to palpation   NEURO motor/sensory function intact   PSYCH alert, A+O to time, place,  person, good insight   Review of Systems:  Subjective/Chief Complaint ABD discomfort, left to right, upper epigastric x 1 week, minimal cough, constipation, weaknmess   General: Weakness   Skin: No Complaints   ENT: No Complaints   Eyes: No Complaints   Neck: No Complaints   Respiratory: No Complaints  scant cough   Cardiovascular: No Complaints   Gastrointestinal: ABD pain, b/l tender to palpation   Genitourinary: No Complaints   Vascular: No Complaints   Musculoskeletal: No Complaints   Neurologic: No Complaints   Hematologic: No Complaints   Endocrine: No Complaints   Psychiatric: No Complaints   Review of Systems: All other systems were reviewed and found to be negative   Medications/Allergies Reviewed Medications/Allergies reviewed     GERD:    Hypercholesterolemia:    Internal hemorrhoids:    Gastric angiectasias:    Diverticulosis:    Glaucoma:    Cancer, Breast:    Hypertension:    Diabetes:    Mastectomy: R side - 1998  Lab Results:  Thyroid:  15-May-15 03:28   Thyroid Stimulating Hormone 0.531 (0.45-4.50 (International Unit)  ----------------------- Pregnant patients have  different reference  ranges for TSH:  - - - - - - - - - -  Pregnant, first trimetser:  0.36 - 2.50 uIU/mL)  Routine Chem:  15-May-15 03:28   Lipase 257 (Result(s) reported on 06 Feb 2014 at 10:10AM.)  Iron Binding Capacity (TIBC) 395  Unbound Iron Binding Capacity 381  Iron, Serum  14  Iron Saturation 4 (Result(s) reported on 06 Feb 2014 at 08:24AM.)  Ferritin [facility]) 10 (Result(s) reported on 06 Feb 2014 at 08:28AM.)  Result Comment HEMOGLOBIN/HEMATOCRIT - RESULTS VERIFIED BY REPEAT TESTING.  Result(s) reported on 06 Feb 2014 at 04:45AM.  Glucose, Serum 98  BUN 15  Creatinine (comp) 0.74  Sodium, Serum 142  Potassium, Serum 3.6  Chloride, Serum  110  CO2, Serum 26  Calcium (Total), Serum 9.1  Anion Gap  6  Osmolality (calc) 284  eGFR (African  American) >60  eGFR (Non-African American) >60 (eGFR values <38m/min/1.73 m2 may be an indication of chronic kidney disease (CKD). Calculated eGFR is useful in patients with stable renal function. The eGFR calculation will not be reliable in acutely ill patients when serum creatinine is changing rapidly. It is not useful in  patients on dialysis. The eGFR calculation may not be applicable to patients at the low and high extremes of body sizes, pregnant women, and vegetarians.)  Magnesium, Serum 2.0 (1.8-2.4 THERAPEUTIC RANGE: 4-7 mg/dL TOXIC: > 10 mg/dL  -----------------------)  Hemoglobin A1c ([facility])  6.6 (The American Diabetes Association recommends that a primary goal of therapy should be <7% and that physicians should reevaluate the treatment regimen in patients with HbA1c values consistently >8%.)  Cholesterol, Serum 101  Triglycerides, Serum 75  HDL (INHOUSE)  38  VLDL Cholesterol Calculated 15  LDL Cholesterol Calculated 48 (Result(s) reported on 06 Feb 2014 at 04:47AM.)  Routine Hem:  15-May-15 03:28   WBC (CBC) 6.3  RBC (CBC) 4.15  Hemoglobin (CBC)  7.5  Hematocrit (CBC)  25.9  Platelet Count (CBC) 172  MCV  62  MCH  18.0  MCHC  28.8  RDW  17.7  Neutrophil % 74.8  Lymphocyte % 13.3  Monocyte % 9.7  Eosinophil % 1.1  Basophil % 1.1  Neutrophil # 4.7  Lymphocyte #  0.8  Monocyte # 0.6  Eosinophil # 0.1  Basophil # 0.1   EKG:  Interpretation EKG with atrial fibrillation   Radiology Results: CT:    14-May-15 17:05, CT Head Without Contrast  CT Head Without Contrast   REASON FOR EXAM:    Weakness  COMMENTS:       PROCEDURE: CT  - CT HEAD WITHOUT CONTRAST  - Feb 05 2014  5:05PM     CLINICAL DATA:  Weakness.    EXAM:  CT HEAD WITHOUT CONTRAST    TECHNIQUE:  Contiguous axial images were obtained from the base of the skull  through the vertex without intravenous contrast.    COMPARISON:  None.  FINDINGS:  No intracranial hemorrhage.    Hypodensity  posterior right operculum region/posterior right frontal  lobe most likely related to prior ischemia/infarct. There issparing  of the overlying gray matter. Vasogenic edema felt to be less likely  consideration for this appearance however if the patient has  progressive symptoms this could be re-evaluated on followup imaging.    No intracranial mass lesion seen separate from above described  findings.    Global atrophy without hydrocephalus.    Vascular calcifications.   IMPRESSION:  No intracranial hemorrhage.    Hypodensity posterior right operculum region/posterior right frontal  lobe most likely related to prior ischemia/infarct. There is sparing  of the overlying gray matter. Vasogenic edema felt to be less likely  consideration for this appearance however if the patient has  progressive symptoms this could be re-evaluated on followup imaging.    Globalatrophy without hydrocephalus.  Electronically Signed    By: Chauncey Cruel M.D.    On: 02/05/2014 17:08     Verified By: Doug Sou, M.D.,    Alphagan: Unknown  Vital Signs/Nurse's Notes: **Vital Signs.:   15-May-15 04:45  Vital Signs Type Routine  Temperature Temperature (F) 98.9  Celsius 37.1  Temperature Source oral  Pulse Pulse 128  Respirations Respirations 22  Systolic BP Systolic BP 552  Diastolic BP (mmHg) Diastolic BP (mmHg) 174  Mean BP 715  Systolic BP Systolic BP 953  Diastolic BP (mmHg) Diastolic BP (mmHg) 69  Pulse Lying Pulse Lying 967  Systolic BP Systolic BP 289  Diastolic BP (mmHg) Diastolic BP (mmHg) 75  Pulse Pulse Sitting 791  Systolic BP Systolic BP 504  Diastolic BP (mmHg) Diastolic BP (mmHg) 136  Pulse Standing Pulse Standing 128  Pulse Ox % Pulse Ox % 98  Pulse Ox Activity Level  At rest  Oxygen Delivery Room Air/ 21 %    Impression Sharon Frazier is a very pleasant 81yo female with PMHx s/f h/o GIB, iron deficiency anemia, requiring laser treatment of gastric angiectasias,  PUD, GERD, h/o diverticulosis and internal hemorrhoids, new atrial fibrillation, presenting with wekness, ABD discomfort upper epigastric x 1 week. Cardiology was consulted for atrial fibrillation, medical management.  1) Atrial fibrillation: as she was doing wel on her meds before the past week, would restart outpt medication. Could continue digoxin 0.125 mg daily Not a good candidate for anticoagulation based on GI hx. low dose asa 81 mg daily  2) ABD  tenderness etiology not clear, musculoskeletal component. If sx perrsist, consider CT ABD. embolic pathology to liver/spleen less likely diverticulitis? She does have a hx  3) Weakness, acute onset with ABD discomfort less likely related to atrial fibrillation Will monitor heart rate and BP  4) H/O GI bleed: Drop in her HCT today Will need to watch closely. Lovenox on hold  5) Anemia: as above, acute Will monitor for blood loss.   Electronic Signatures: Rogelia Mire (NP)  (Signed 15-May-15 09:00)  Authored: General Aspect/Present Illness, Home Medications, Allergies Ida Rogue (MD)  (Signed 15-May-15 10:28)  Authored: General Aspect/Present Illness, History and Physical Exam, Review of System, Past Medical History, Labs, EKG , Radiology, Vital Signs/Nurse's Notes, Impression/Plan  Co-Signer: General Aspect/Present Illness, Home Medications, Allergies   Last Updated: 15-May-15 10:28 by Ida Rogue (MD)

## 2015-01-16 NOTE — Consult Note (Signed)
General Aspect Sharon Frazier is a very pleasant 81yo female w/ no prior cardiac history, PMHx s/f h/o GIB, iron deficiency anemia, requiring laser treatment of gastric angiectasias, PUD, GERD, h/o diverticulosis and internal hemorrhoids, HTN, HLD, DM2, h/o tobacco abuse, R breast CA s/p mastectomy & chemotherapy who is admitted to [facility] due to weakness, fatigue and palpitations. New onset atrial fibrillation w/ RVR identified and cardiology consulted.   She denies prior chest pain, heart attacks, stress tests, cardiac caths or heart surgery.  She is quite functional. She lives by herself. She participates in the [employer] every year. Her events include corn hole, bocci ball, shuffle board and horse shoes. She has a wall full of medals and is hoping to participate in this year's games April 4th.   She reports being in her USOH until ~ 2 weeks ago when she developed cough productive of yellow sputum, malaise, weakness and fatigue. The cough has persisted, and improved somewhat; however, she has remained weak and fatigued, most notably over the past 2 days. This AM, she awoke and "didn't feel right." She experienced tachy-palpitations and "flip flopping." She denies associated lightheadedness, syncope, chest pain or shortness of breath. She denies PND, orthopnea, weight gain, LE edema. She denies frank bloody stools, urine, coughing up or vomiting blood. She denies n/v/d, urinary or bowel changes.   Present Illness In the ED, EKG revealed atrial fibrillation, 125 bpm, nonspecific ST changes, flat appearing depressions in II, III, aVF. Initial TnI WNL. Hgb 8.4/Hct 28.8. MCV 64. CMP- K 3.1, otherwise largely WNL. TnI WNL. Lipase WNL. U/a w/o evidence of UTI. CXR- no acute process, hyperinflation, degenerative thoracic spinal changes. Abomdinal u/s (performed for RUQ/epigastric pain)- hepatic cirrhosis, no acute biliary or intra-abdominal process otherwise.   She was told "there was no blood in my stool" by  an ED provider. Unable to see FOBT status.   She was admitted by the medicine team. She was given a dose of diltiazem 42m IV x 1 w/ improved HR to low 100s. Symptoms improved. Denies palpitations currently. Started on diltiazem [id number]. Currently on amlodipine as well.   Not started on anticoagulation.  SOCIAL H[place] H/o 50 year tobacco abuse (2 PPD), quit in 1978sNo EtOH or illicit drug use.  FAMILY HISTORY Denies h/o heart disease.   PAST MEDICAL HISTORY:  1. Diabetes.  2. Hypertension.  3. Glaucoma.  4. Hyperlipidemia.  5. Gastroesophageal reflux disease. 6. Peptic ulcer disease.  7. Last admitted in September of 2008 because of anemia secondary to gastric angiectasias status post coagulation.  8. History of laser treatment of gastric angiectasias in 2007. 9. History of diverticulosis and internal hemorrhoids, per colonoscopy in 10/2005. 10. History of right breast cancer status post mastectomy and chemotherapy. Follows with Dr. FiGrayland Ormond 11. She got two upper endoscopies in 2008. Her last upper endoscopy was in 11/2007, as a followup, which showed gastric angiectasias and multiple duodenal ulcers. . Marland Kitchen PAST SURGICAL HISTORY:  1. Right mastectomy. 2. Left hip surgery because of a fracture, nailing done.  3. Stents in bilateral lower extremities by Dr. HeHulda Humphreyecause of peripheral vascular disease.   Physical Exam:  GEN well developed, no acute distress, thin   HEENT pink conjunctivae, PERRL, hearing intact to voice   NECK supple  No masses  trachea midline  no JVD or bruits   RESP normal resp effort  no use of accessory muscles  scattered exp wheezes and rhonchi, no  rales   CARD Normal, S1, S2  No murmur  irregularly irregular   ABD soft  normal BS   EXTR negative cyanosis/clubbing, negative edema   SKIN normal to palpation   NEURO motor/sensory function intact   PSYCH alert, A+O to time, place, person, good insight    Review of Systems:  Subjective/Chief Complaint fatigue, palpitations, weakness   General: Fatigue    Skin: No Complaints    ENT: No Complaints    Eyes: No Complaints    Neck: No Complaints    Respiratory: Frequent cough    Cardiovascular: Palpitations    Gastrointestinal: No Complaints    Genitourinary: No Complaints    Vascular: No Complaints    Musculoskeletal: No Complaints    Neurologic: No Complaints    Hematologic: No Complaints    Endocrine: No Complaints    Psychiatric: No Complaints    Review of Systems: All other systems were reviewed and found to be negative   Medications/Allergies Reviewed Medications/Allergies reviewed    Home Medications: Medication Instructions Status  Ferrex-150 oral capsule 1 cap(s) orally 2 times a day Active  amlodipine 5 mg oral tablet 1 tab(s) orally once a day (in the morning) Active  omeprazole 20 mg oral delayed release capsule 1 cap(s) orally 2 times a day Active  GlipiZIDE XL 5 mg oral tablet, extended release 1 tab(s) orally once a day Active  hydrochlorothiazide-lisinopril 12.5 mg-10 mg oral tablet 1 tab(s) orally once a day (in the morning) Active  simvastatin 10 mg oral tablet 1 tab(s) orally once a day (at bedtime) Active  dorzolamide 2% ophthalmic solution 1 drop(s) to each eye 2 times a day Active  latanoprost 0.005% ophthalmic solution 1 drop(s) to each eye once a day (at bedtime) Active   Lab Results:  LabObservation:  19-Mar-15 16:06   OBSERVATION Reason for Test  Hepatic:  19-Mar-15 09:46   Bilirubin, Total 0.3  Alkaline Phosphatase 99 (45-117 NOTE: New Reference Range 08/15/13)  SGPT (ALT) 13  SGOT (AST) 15  Total Protein, Serum 7.3  Albumin, Serum 3.4  Routine Chem:  19-Mar-15 09:46   Magnesium, Serum 2.1 (1.8-2.4 THERAPEUTIC RANGE: 4-7 mg/dL TOXIC: > 10 mg/dL  -----------------------)  Glucose, Serum  171  BUN  19  Creatinine (comp) 0.96  Sodium, Serum 139  Potassium, Serum  3.1   Chloride, Serum 106  CO2, Serum 28  Calcium (Total), Serum 9.3  Osmolality (calc) 284  eGFR (African American) >60  eGFR (Non-African American)  52 (eGFR values <18m/min/1.73 m2 may be an indication of chronic kidney disease (CKD). Calculated eGFR is useful in patients with stable renal function. The eGFR calculation will not be reliable in acutely ill patients when serum creatinine is changing rapidly. It is not useful in  patients on dialysis. The eGFR calculation may not be applicable to patients at the low and high extremes of body sizes, pregnant women, and vegetarians.)  Anion Gap  5  Lipase 277 (Result(s) reported on 11 Dec 2013 at 10:49AM.)  Cardiac:  19-Mar-15 09:46   Troponin I < 0.02 (0.00-0.05 0.05 ng/mL or less: NEGATIVE  Repeat testing in 3-6 hrs  if clinically indicated. >0.05 ng/mL: POTENTIAL  MYOCARDIAL INJURY. Repeat  testing in 3-6 hrs if  clinically indicated. NOTE: An increase or decrease  of 30% or more on serial  testing suggests a  clinically important change)  Routine UA:  19-Mar-15 11:46   Color (UA) Yellow  Clarity (UA) Clear  Glucose (UA) Negative  Bilirubin (UA) Negative  Ketones (UA) Negative  Specific Gravity (UA) 1.015  Blood (UA) Negative  pH (UA) 6.0  Protein (UA) 100 mg/dL  Nitrite (UA) Negative  Leukocyte Esterase (UA) Negative (Result(s)reported on 11 Dec 2013 at 12:48PM.)  RBC (UA) 1 /HPF  WBC (UA) 2 /HPF  Bacteria (UA) NONE SEEN  Epithelial Cells (UA) 1 /HPF  Mucous (UA) PRESENT  Hyaline Cast (UA) 10 /LPF (Result(s) reported on 11 Dec 2013 at 12:48PM.)  Routine Hem:  19-Mar-15 09:46   WBC (CBC) 3.7  RBC (CBC) 4.48  Hemoglobin (CBC)  8.4  Hematocrit (CBC)  28.8  Platelet Count (CBC) 201 (Result(s) reported on 11 Dec 2013 at 10:14AM.)  MCV  64  MCH  18.7  MCHC  29.1  RDW  17.3   EKG:  Interpretation EKG shows atrial fibrillation w/ RVR, flat appearing ST depressions (19m) II, III, aVF, nonspecific ST changes    Rate 125   EKG Comparision no prior tracing for comparison   Radiology Results:  XRay:    19-Mar-15 10:05, Chest PA and Lateral  Chest PA and Lateral   REASON FOR EXAM:    dizzy  COMMENTS:   May transport without cardiac monitor    PROCEDURE: DXR - DXR CHEST PA (OR AP) AND LATERAL  - Dec 11 2013 10:05AM     CLINICAL DATA:  Cough, dizziness, weakness    EXAM:  CHEST  2 VIEW    COMPARISON:  08/15/2012    FINDINGS:  Cardiomediastinal silhouette is stable. No acute infiltrate or  pleural effusion. No pulmonary edema. Stable hyperinflation and  chronic mild interstitial prominence. Osteopenia and degenerative  changes thoracic spine.     IMPRESSION:  No active disease. Hyperinflation and chronic mild interstitial  prominence. Degenerative changes thoracic spine. Osteopenia.      Electronically Signed    By: LLahoma CrockerM.D.    On: 12/11/2013 10:09         Verified By: LEphraim Hamburger M.D.,  [place]    19-Mar-15 11:03, [place]  [place]   REASON FOR EXAM:    epigastric + ruq pain, tender, gen. weakness eval   Aorta also  COMMENTS:   May transport without cardiac monitor    PROCEDURE: [place] - [place] GENERAL SURVEY  - Dec 11 2013 11:03AM     CLINICAL DATA:  Epigastric pain. Right upper quadrant pain and  tenderness.    EXAM:  ULTRASOUND ABDOMEN COMPLETE    COMPARISON:  None.    FINDINGS:  Gallbladder:    No gallstones or wall thickening visualized. No sonographic Murphy  sign noted.    Common bile duct:    Diameter: 3 mm    Liver:    Nodular capsular contour, suspicious for panic cirrhosis.  Parenchymal echogenicity within normal limits. No focal liver mass  visualized.  IVC:    No abnormality visualized.    Pancreas:    Visualized portion unremarkable.    Spleen:    Size and appearance within normal limits.    Right Kidney:    Length: 11.4 cm. Echogenicity within normal limits. No mass or  hydronephrosis  visualized. 1 cm cyst noted in lower pole.    Left Kidney:    Length: 10.8 cm. Echogenicity within normal limits. No mass or  hydronephrosis visualized.    Abdominal aorta:    No aneurysm visualized.  Calcified atherosclerotic plaque noted.    Other findings:    None.   IMPRESSION:  No  evidence of gallstones, biliary dilatation, or other acute  findings.    Probable hepatic cirrhosis.  No hepatic mass or ascites visualized.    No evidence of abdominal aortic aneurysm.      Electronically Signed    By: Earle Gell M.D.    On: 12/11/2013 11:06         Verified By: Marlaine Hind, M.D.,  Cardiology:    19-Mar-15 15:51, Echo Doppler  Echo Doppler   REASON FOR EXAM:      COMMENTS:       PROCEDURE: [facility] - ECHO DOPPLER COMPLETE(TRANSTHOR)  - Dec 11 2013  3:51PM     RESULT: Echocardiogram Report    Patient Name:   Sharon Frazier Date of Exam: 12/11/2013  Medical Rec #:  [id number]              Custom1:  Date of Birth:  Mar 18, 1922          Height:       60.0 in  Patient Age:    72 years            Weight:       110.0 lb  Patient Gender: F                   BSA:          1.45 m??    Indications: Atrial Fib  Sonographer:    Arville Go RDCS  Referring Phys: Demetrios Loll    Summary:   1. Rhythm is atrial fibrillation.   2. Left ventricular ejection fraction, by visual estimation, is 60 to   65%.   3. Normal global left ventricular systolic function.   4. Normal right ventricular size and systolic function.   5. Mild mitral valve regurgitation.   6. Mild tricuspid regurgitation.   7. Mildly elevated pulmonary artery systolic pressure.  2D AND M-MODE MEASUREMENTS (normal ranges within parentheses):  Left Ventricle:          Normal  IVSd (2D):     1.07 cm (0.7-1.1)  LVPWd (2D):     1.16 cm (0.7-1.1) Aorta/LA:                  Normal  LVIDd (2D):     3.73 cm (3.4-5.7) Aortic Root (2D): 2.40 cm (2.4-3.7)  LVIDs (2D):     2.65 cm           Left Atrium (2D): 3.20 cm  (1.9-4.0)  LV FS (2D):29.0 %   (>25%)  LV EF (2D):     56.5 %   (>50%)                                    Right Ventricle:                                    RVd (2D):  SPECTRAL DOPPLER ANALYSIS (where applicable):  Aortic Valve: AoV Max Vel: 1.64 m/s AoV Peak PG: 10.8 mmHg AoV Mean PG:  LVOT Vmax: 1.13 m/s LVOT VTI:  LVOT Diameter: 1.70 cm  AoV Area, Vmax: 1.56 cm?? AoV Area, VTI:  AoV Area, Vmn:  Tricuspid Valve and PA/RV Systolic Pressure: TR Max Velocity: 2.72 m/s RA   Pressure: 5 mmHg RVSP/PASP: 34.5 mmHg  Pulmonic Valve:  PV Max Velocity: 1.07 m/s PV Max  PG: 4.6 mmHg PV Mean PG:  PHYSICIAN INTERPRETATION:  Left Ventricle: The left ventricular internal cavity size was normal. LV   posterior wall thickness was normal. Global LV systolic function was   normal. Left ventricular ejection fraction, by visual estimation, is 60   to 65%.  Right Ventricle: Normal right ventricular size, wall thickness, and   systolic function. The right ventricular size is normal. Global RV   systolic function is normal.  Left Atrium: The left atrium is normal in size.  Pericardium: There is no evidence of pericardial effusion.  Mitral Valve: The mitral valve is normal in structure. Mild mitral valve   regurgitation is seen.  Tricuspid Valve: The tricuspid valve is normal. Mild tricuspid   regurgitation is visualized. The tricuspid regurgitant velocity is 2.72   m/s, and with an assumed right atrial pressure of 5 mmHg, the estimated     right ventricular systolic pressure is mildly elevated at 34.5 mmHg.  Aortic Valve: Mild to moderate aortic valve sclerosis/calcification is   present, without any evidence of aortic stenosis. Trivial aortic valve   regurgitation is seen.  Pulmonic Valve: The pulmonic valve is normal. Mild pulmonic valve   regurgitation.  Aorta: The aortic root and ascending aorta are structurally normal, with   no evidence of dilitation.    [id number] Ida Rogue MD  Electronically  signed by [id number] Ida Rogue MD  Signature Date/Time: 12/11/2013/6:12:16 PM    *** Final ***  IMPRESSION: .        Verified By: Minna Merritts, M.D., MD    Alphagan: Unknown  Vital Signs/Nurse's Notes: **Vital Signs.:   19-Mar-15 15:44  Vital Signs Type Admission  Temperature Temperature (F) 98  Celsius 36.6  Temperature Source oral  Pulse Pulse 127  Respirations Respirations 18  Systolic BP Systolic BP 017  Diastolic BP (mmHg) Diastolic BP (mmHg) 72  Mean BP 98  Systolic BP Systolic BP 793  Diastolic BP (mmHg) Diastolic BP (mmHg) 78  Pulse Lying Pulse Lying 1  Systolic BP Systolic BP 903  Diastolic BP (mmHg) Diastolic BP (mmHg) 72  Systolic BP Systolic BP 009  Diastolic BP (mmHg) Diastolic BP (mmHg) 72  Pulse Ox % Pulse Ox % 95  Pulse Ox Activity Level  At rest  Oxygen Delivery Room Air/ 21 %    Impression 82yo female w/ no prior cardiac history, PMHx s/f h/o GIB, iron deficiency anemia, requiring laser treatment of gastric angiectasias, PUD, GERD, h/o diverticulosis and internal hemorrhoids, HTN, HLD, DM2, h/o tobacco abuse, R breast CA s/p mastectomy & chemotherapy who is admitted to [facility] due to weakness, fatigue and palpitations. New onset atrial fibrillation w/ RVR identified and cardiology consulted.   1. New onset atrial fibrillation w/ RVR Suspect recent URI/bronchitis provided trigger.   malaise, fatigue x 2 weeks. RVR this AM likely accounts for palpitations. Currently in atrial fib and with no sx.  CHA2DS2VASc = 5 (HTN, age > 31, DM2, female). Limiting anticoagulation is the patient's long history of GIBs.  microcytic anemia.   Seen by Dr. Tiffany Kocher recently with EGD and cautery. She reports trace positive blood in stool.   --Exact timing of atrial fib unclear asn she is currently with no sx. Risk to trying to pharmacologically convert if uncertain when she began having atrial fib Normal ECHO, EF >55% -- Diltiazem 120 mg daily started. -- D/c amlodipine --  Increase ACEi -- Cycle troponins Hold anticoagulation given long GI bleed hx, anemia  2. Microcytic anemia Longstanding h/o  GIB. Required laser therapy for gastric angiectasias last year.  -- Check FOBT, iron studies  3. Hypertension now on diltiazem 120 mg daily  4. Hyperlipidemia -- Check lipid panel  5. DM2 -- Glycemic control per primary team -- Check Hgb A1C   Electronic Signatures: Yahayra Geis A (PA-C)  (Signed 19-Mar-15 17:03)  Authored: General Aspect/Present Illness, History and Physical Exam, Review of System, Home Medications, Labs, EKG , Radiology, Allergies, Vital Signs/Nurse's Notes, Impression/Plan Ida Rogue (MD)  (Signed 19-Mar-15 19:34)  Authored: General Aspect/Present Illness, History and Physical Exam, Review of System, EKG , Radiology, Impression/Plan  Co-Signer: General Aspect/Present Illness, History and Physical Exam, Review of System, Home Medications, Labs, EKG , Radiology, Allergies, Vital Signs/Nurse's Notes, Impression/Plan   Last Updated: 19-Mar-15 19:34 by Ida Rogue (MD)

## 2015-01-16 NOTE — H&P (Signed)
PATIENT NAME:  Sharon Frazier, Sharon Frazier MR#:  [id number] DATE OF BIRTH:  [birth year]  DATE OF ADMISSION:  02/05/2014  REASON FOR ADMISSION: Weakness.  PRIMARY CARDIOLOGIST: Dr. Rockey Situ   PRIMARY CARE PHYSICIAN: Dr. Hardin Negus   REFERRING PHYSICIAN: Lenise Arena, MD  HISTORY OF PRESENT ILLNESS:  This is a very nice 81 year old female who has history of recent admission on 12/11/2013 due to new onset atrial fibrillation. At that moment, the patient was discharged with medications for rate control, diltiazem and metoprolol. The patient was not put on anticoagulation due to previous history of GI bleeding.  The patient comes today with a history of feeling weak for 1 week. She has difficulty walking that has been progressing since then. The patient has soreness on her rib cage due to her position whenever she walks and slowly this has been getting worse to the point that she could not even get up to the bathroom today. The patient has no appetite, has not been eating or drinking very much. The patient denies any singular incident that she can recall of weakness with slurred speech, headaches or any similar symptoms. The patient states that this is just going on for a week, getting worse. Whenever she checked in with her primary care physician, he recommended to be seen in the ER and her cardiologist thought that she might have some issues that could be related to side effects of her medications.  The patient actually has history of anemia. In the past month of March, her hemoglobin was 7.7. Today it is actually much better; it is 10.  During the evaluation in the ER, the patient has been stable hemodynamically. No major findings on her physical exam, as far as weakness, other than just being generalized but nothing focal. The patient had a CT scan that showed a hypodensity posterior right opercular region posterior right frontal lobe most likely related to prior ischemic attack or infarct. There is spurring  of the overlying gray matter but did not feel like that was vasogenic edema.  Overall, the patient is admitted mostly because of evaluation of this weakness. We are going to get an MRI to evaluate each relationship of this stroke. It does seem > to be acute right now, but it could be a subacute stroke. Depending on these findings, we are going to get neurology involved. For now, we are going to also get cardiology involved to evaluate the need of anticoagulation. I think she will benefit from it. Reviewing her bleeding history, actually the GI bleeding that she had was never acute or massive. It was found that the patient had anemia and after an endoscopy, the patient had some arteriovenous malformations that were catheterized; and after catheterization, she apparently has not had any problems. Her hemoglobin was down to 7, the last time that she was here but now it is 10, which could raise a suspicion of constant bleeding. The patient admitted for observation evaluation and treatment of this condition.   REVIEW OF SYSTEMS:  A 12-system review of system is done. CONSTITUTIONAL: No fever.  Positive fatigue and weakness. No weight loss or weight gain.  EYES: No blurry vision, double vision.  EARS, NOSE, THROAT: No difficulty swallowing. No tinnitus.  RESPIRATORY: No shortness of breath, cough or COPD.  CARDIOVASCULAR: No chest pain, orthopnea, palpitations.  GASTROINTESTINAL: No nausea, vomiting or abdominal pain.  GENITOURINARY: No dysuria, hematuria or changes in frequency.  ENDOCRINE: No polyuria, polydipsia, polyphagia, cold or heat intolerance. HEMATOLOGIC AND LYMPHATIC: No anemia,  easy bruising or bleeding.  MUSCULOSKELETAL: No significant neck pain or back pain.  SKIN: No rashes or petechiae.  NEUROLOGIC: Positive generalized weakness. No vertigo. No ataxia. No previous CVAs or TIAs. No seizures. No memory loss. No dysarthria. No tremor.  PSYCHIATRIC: No insomnia or depression. The patient is  alert, oriented x 3.   PAST MEDICAL HISTORY: 1.  New onset atrial fibrillation.  2.  Hypertension.  3.  Diabetes.  4.  Hyperlipidemia.  5.  GERD. 6.  Peptic ulcer disease.  7.  Glaucoma.  8.  History of gastric angiectasia status post catheterization. 9.  History of diverticulosis.  10.  Internal hemorrhoids.  11.  Breast cancer.  12.  Peripheral vascular disease.  PAST SURGICAL HISTORY: 1.  Right total mastectomy. 2.  Left hip surgery.  3.  Stents on the lower extremities.  SOCIAL HISTORY: The patient does not smoke, does not drink at this moment. She does not use drugs.   FAMILY HISTORY: Positive for colon cancer in her sister.   ALLERGIES: ALPHAGAN.   CURRENT MEDICATIONS: Simvastatin 10 mg daily, omeprazole 20 mg twice daily, metoprolol 25 mg twice daily, latanoprost 0.005% in each eye, hydrochlorothiazide 12.5/10 mg once a day, glipizide 5 mg once daily, Ferrex 150 mg twice daily, dorzolamide 2% twice daily, diltiazem 120 mg once a day.   PHYSICAL EXAMINATION: VITAL SIGNS: Blood pressure 149/57, pulse 91, respirations 18, temperature 97.9, oxygen saturation 100% on room air. GENERAL:  Alert and oriented x 3, in no acute distress. No respiratory distress. Hemodynamically stable.  HEENT: Pupils are equal, round and reactive. Extraocular movements are intact. Mucosa are moist. Anicteric sclerae. Pink conjunctivae. No oral lesions. No oropharyngeal exudates.  NECK: Supple. No JVD. No thyromegaly. No adenopathy. No carotid bruits. No rigidity.  CARDIOVASCULAR: Irregularly irregular. No murmurs, rubs or gallops. No displacement of PMI.  LUNGS: Clear, without (wheezing ABDOMEN: Soft, nontender, nondistended. No hepatosplenomegaly. No masses. Bowel sounds are positive.  EXTREMITIES: No edema, cyanosis or clubbing. Pulses +2. Capillary refill less than 3.  SKIN: No rashes, petechiae, new lesions.  NEUROLOGIC: Cranial nerves II through XII intact. Strength is 5/5 in all 4  extremities. No focal findings. The patient is able to do rapid alternating movements. Finger-to-nose test is normal. No cerebellar findings. Uvula is central. Tongue central. No facial droop. No pronator drift. Essentially, no focal findings. Normal neurologic exam. DTRs +2.  PSYCHIATRIC: No agitation.  Alert and oriented x 3.   LYMPHATIC: Negative for lymphadenopathy in axillary or supraclavicular areas.   RESULTS: CT scan as mentioned above. Hemoglobin is 10. Previous hemoglobin was 7.7, platelet count 222. White blood cell count 9.2. Troponin is negative. Glucose is 114, BUN 17, creatinine 0.87, previous hemoglobin A1c 7.5. Urinalysis negative for signs of urinary tract infection.   EKG: Atrial fibrillation, heart rate around 100.   ASSESSMENT AND PLAN: This is a nice 81 year old female has history of atrial fibrillation, recently diagnosed hypertension, hyperlipidemia, peripheral vascular disease, diabetes. She is admitted for weakness and evaluation as laid out further. 1.  Weakness. The patient has a history of weakness that has been going on for a week. The most likelihood is that the patient had side effects from her beta blocker and calcium channel blocker mixed for which we are going to stop it and start on digoxin. As the finding, CT scan that could be subacute, indeterminate age at this moment. The cerebrovascular accident may be old, although we cannot guarantee the age.  The patient did not  have history of cerebrovascular accidents in the past and now she has atrial fibrillation. She was not anticoagulated because of history of gastrointestinal bleeding in the past. Her [place] Vasc score is around 5. The patient has a history of angiectasis that were catheterized and she has not had any problems since 2007, for which will be worth to explore the possibility of starting her on anticoagulation. For now, the patient is going to be switched from metoprolol and diltiazem to digoxin. We are going to  get consultation by cardiology. I am going to start her on aspirin 81 mg at least to have any type of stroke protection, despite the fact that it is not the most effective in her case.  We are going to use enteric-coated to prevent any exacerbation of gastrointestinal problems. 2.  As far as her atrial fibrillation goes, the patient is having a heart rate fluctuating in the 90s and 100s.  We just gave her the first dose of digoxin. We are going to monitor closely. Keep her on telemetry. We could use rapid-acting beta blockers for the rate if necessary.  3.  Other medical problems are stable. Continue statin therapy. Continue proton pump inhibitor. Continue hydrochlorothiazide and lisinopril for blood pressure control. Continue glipizide for her diabetes. 4.  She has anemia of chronic disease. Check B12 and iron levels. Since the patient is weak, we are going to check also vitamin D and TSH.   I spent about 45 minutes with this patient. The patient is a FULL CODE.    ____________________________ [place] Sink, MD rsg:ce D: 02/05/2014 19:17:54 ET T: 02/05/2014 19:51:05 ET JOB#: [id number]  cc: [place] Sink, MD, <Dictator> Sean Malinowski America Brown MD ELECTRONICALLY SIGNED 02/13/2014 22:35

## 2015-01-16 NOTE — Discharge Summary (Signed)
PATIENT NAME:  Sharon Frazier, Sharon Frazier MR#:  [id number] DATE OF BIRTH:  1922/08/18  DATE OF ADMISSION:  04/20/2014 DATE OF DISCHARGE:  04/24/2014  ADMITTING PHYSICIAN: Vaughan Basta, MD  DISCHARGING PHYSICIAN: Gladstone Lighter, MD  PRIMARY CARE PHYSICIAN: Dr. Jacki Cones.  [place]: None.  PRIMARY CARDIOLOGIST: Dr. Rockey Situ  DISCHARGE DIAGNOSES: 1.  Acute and chronic diastolic congestive heart failure.  2.  Bilateral pleural effusions, status post right thoracentesis.  3.  Hypertension.  4.  Diabetes mellitus with hypoglycemia.  5.  Gastroesophageal reflux disease.  6.  Hyperlipidemia. 7.  History of atrial fibrillation. 8.  Anemia requiring 1 unit of packed RBC transfusion here in the hospital.  DISCHARGE HOME MEDICATIONS:  1.  Omeprazole 20 mg p.o. b.i.d.  2.  Dorzolamide 2% ophthalmic solution 1 drop each eye twice a day.  3.  Latanoprost 0.005% ophthalmic solution 1 drop each eye once a day at bedtime.  4.  Ergocalciferol 50,000 international units 1 capsule once a week. 5.  Glipizide XL 2.5 mg p.o. daily.  6.  Simvastatin 10 mg p.o. at bedtime.  7.  HCTZ/lisinopril 12.5/10 mg 1 tablet p.o. daily.  8.  Aspirin 81 mg p.o. daily.  9.  Metoprolol 25 mg p.o. b.i.d.  10.  Cardizem 120 mg p.o. daily.  11.  Ferrex 150 mg capsule p.o. b.i.d.  12.  Lasix 40 mg p.o. b.i.d.  13.  Potassium chloride 20 mEq p.o. daily.  14.  Levaquin 500 mg p.o. daily for 4 days.  HOME OXYGEN: 2 liters.   DISCHARGE DIET: Low-sodium.  DISCHARGE ACTIVITY: As tolerated.   FOLLOWUP INSTRUCTIONS: 1.  PCP follow-up in 1 week.  2.  CBC checked in 1 week.  3.  Check with PCP about setting up appointment for left thoracentesis for pleural effusion removal.   HOME HEALTH: Physical therapy and nursing.  DIAGNOSTIC DATA: Labs and imaging studies prior to discharge: Sodium 140, potassium 4.1, chloride 102, bicarb 33, BUN 16, creatinine 0.66, glucose 101, and calcium  9.7.  Ultrasound-guided thoracentesis with 800 mL of clear yellow pleural fluid that was removed.  Cultures have been negative so far. Likely transudate from her CHF.   WBC 4.4, hemoglobin 8.5, hematocrit 29, platelet count 164,000. The patient did receive 1 unit packed RBC transfusion for hemoglobin of 6.8.   CT of the chest with contrast on admission showing small focus of air space opacity in anterior aspect of left upper lobe. Could be bronchopneumonia. Moderate bilateral pleural effusions, right greater than left, cardiomegaly, and extensive atherosclerosis in the coronary arteries with emphysema and cirrhosis and also upper abdominal ascites noted.   BRIEF HOSPITAL COURSE: Ms. Crammer an 81 year old elderly Caucasian female with past medical history significant for hypertension, diabetes, atrial fibrillation, diastolic CHF, history of angiectasias, GI bleed and anemia who presented to the hospital secondary to worsening shortness of breath and pedal edema, noted to be in CHF exacerbation with elevated BNP of 10,000.  1.  Acute on chronic respiratory failure secondary to acute on chronic diastolic CHF exacerbation. The patient's echo showed EF of 60%. The patient has pulmonary edema and also bilateral pleural effusions. She was diuresed with Lasix 3 times a day and is being changed over to b.i.d. Lasix. She is requiring 2 liters of oxygen, which she will be discharged on; however, the pleural effusions did not improve much with diuresis so she had right-sided thoracentesis done with about 800 mL taken out and was advised to follow up with PCP to see  if outpatient left-sided thoracentesis can be considered if her shortness of breath did not improve. The patient has clinically improved a lot. She is being discharged on b.i.d. Lasix with potassium supplements. She can follow up with Dr. Rockey Situ in the office in the next 1 to 2 weeks.  2.  Atrial fibrillation, rate controlled. She is on Cardizem and  metoprolol. Only on aspirin for anticoagulation due to her history of GI bleed and anemia.  3.  Possible bronchopneumonia, as noted on the chest x-ray. The patient was on antibiotics, Rocephin and azithromycin here in the hospital and will be discharged on Levaquin. 4.  Acute on chronic anemia requiring 1 unit packed RBC transfusion secondary to slow GI bleed with history of angiectasias in the past. Hemoglobin stable around 8. Outpatient follow-up of hemoglobin required and she is on iron supplements.   Her course has been otherwise uneventful in the hospital.   DISCHARGE CONDITION: Stable.   DISCHARGE DISPOSITION: Home with home health, physical therapy and nursing.   CODE STATUS: DO NOT RESUSCITATE as Dr. Vianne Bulls has discussed with the patient's family during the hospital course.   TIME SPENT ON DISCHARGE: 40 minutes.   ____________________________ Gladstone Lighter, MD rk:sb D: 04/24/2014 11:54:04 ET T: 04/24/2014 13:27:18 ET JOB#: [id number]  cc: Gladstone Lighter, MD, <Dictator> Minna Merritts, MD Morton Peters., MD Gladstone Lighter MD ELECTRONICALLY SIGNED 05/12/2014 13:49

## 2015-01-16 NOTE — Discharge Summary (Signed)
PATIENT NAME:  Sharon Frazier, STORBECK MR#:  [id number] DATE OF BIRTH:  1922/05/07  DATE OF ADMISSION:  12/11/2013 DATE OF DISCHARGE:  12/14/2013  For a detailed note, please take a look at the history and physical done on admission by Dr. Bridgett Larsson.   DISCHARGE DIAGNOSIS:  1. New onset atrial fibrillation.  2. Diabetes.  3. Hypertension.  4. Glaucoma.  5. Hyperlipidemia.   DIET: The patient is being discharged on a low-sodium, low-fat diet.   ACTIVITY: As tolerated.   FOLLOWUP: With Dr. Laurian Brim, Dr. Ida Rogue in the next week or so.   DISCHARGE MEDICATIONS: Iron 150-mg capsule 1 capsule b.i.d., omeprazole 20 mg b.i.d., glipizide XL 5 mg daily, hydrochlorothiazide/lisinopril 12.5/10 mg 1 tablet daily, simvastatin 10 mg at bedtime, dorzolamide ophthalmic solution b.i.d., latanoprost 0.005% ophthalmic solution at bedtime, metoprolol tartrate 25 mg b.i.d., Cardizem CD 120 mg daily.   [place] COURSE: Minna Merritts, MD, cardiology.   PERTINENT STUDIES DONE DURING THE HOSPITAL COURSE: As follows: A chest x-ray done on admission showing no acute disease, hyperinflation. An ultrasound of her abdomen showing no acute abnormality. A 2-dimensional echocardiogram done showing ejection fraction of 60% to 65%, normal global LV function, mild mitral valve regurgitation, mild tricuspid regurgitation.   HOSPITAL COURSE: This is an 81 year old female with medical problems as mentioned above who presented to the hospital with palpitations and weakness and noted to be in new-onset atrial fibrillation.   1. Atrial fibrillation with rapid ventricular response. This was likely the cause of the patient's weakness and palpitations. This is new onset for the patient. This was likely triggered by a recent bout of bronchitis that the patient had. The patient was admitted to the hospital to telemetry. The patient was started on rate controlling medications including oral Cardizem. Also  given pulse doses of IV Cardizem. Also started on oral metoprolol. The patient, after being started on rate controlling medications, has significantly improved and heart rate has now improved and stable in the 70s to 80s. She was also ambulated. Her heart rate did not get any worse. She is clinically asymptomatic and therefore being discharged home on the oral Cardizem and metoprolol for rate control. Her CHADS-VASc Score is 5, but she is a high risk for anticoagulation given her previous history of GI bleed. Therefore, currently she is not being discharged coagulants. Her echocardiogram did show a normal ejection fraction.  2. Hypertension. The patient remained hemodynamically stable in the hospital. She will now continue her Cardizem, metoprolol, and lisinopril/hydrochlorothiazide.  3. Diabetes. The patient was maintained on some sliding scale insulin, but will resume her glipizide upon discharge. 4. Glaucoma. The patient was maintained on her dorzolamide and latanoprost eyedrops. She will resume that.  5. Hyperlipidemia. The patient was maintained on her simvastatin. She will resume that.  6. GERD. The patient was maintained on her Protonix and she will resume that upon discharge, too.   CODE STATUS: The patient is a full code.   TIME SPENT ON DISCHARGE: 40 minutes.    ____________________________ Belia Heman. Verdell Carmine, MD vjs:ah D: 12/14/2013 16:19:11 ET T: 12/15/2013 06:56:30 ET JOB#: [id number]  cc: Belia Heman. Verdell Carmine, MD, <Dictator> Morton Peters., MD Minna Merritts, MD Henreitta Leber MD ELECTRONICALLY SIGNED 12/19/2013 20:27

## 2015-01-21 ENCOUNTER — Other Ambulatory Visit: Payer: Self-pay | Admitting: Cardiovascular Disease

## 2015-02-05 DIAGNOSIS — I5032 Chronic diastolic (congestive) heart failure: Secondary | ICD-10-CM

## 2015-03-01 ENCOUNTER — Ambulatory Visit: Payer: Medicare Other | Attending: Family | Admitting: Family

## 2015-03-01 ENCOUNTER — Encounter: Payer: Self-pay | Admitting: Family

## 2015-03-01 ENCOUNTER — Ambulatory Visit: Payer: Self-pay | Admitting: Family

## 2015-03-01 VITALS — BP 137/50 | HR 65 | Resp 20 | Ht 60.0 in | Wt 98.5 lb

## 2015-03-01 DIAGNOSIS — D649 Anemia, unspecified: Secondary | ICD-10-CM | POA: Diagnosis not present

## 2015-03-01 DIAGNOSIS — K5900 Constipation, unspecified: Secondary | ICD-10-CM | POA: Diagnosis not present

## 2015-03-01 DIAGNOSIS — I509 Heart failure, unspecified: Secondary | ICD-10-CM | POA: Diagnosis present

## 2015-03-01 DIAGNOSIS — E119 Type 2 diabetes mellitus without complications: Secondary | ICD-10-CM | POA: Diagnosis not present

## 2015-03-01 DIAGNOSIS — D508 Other iron deficiency anemias: Secondary | ICD-10-CM

## 2015-03-01 DIAGNOSIS — I5032 Chronic diastolic (congestive) heart failure: Secondary | ICD-10-CM

## 2015-03-01 NOTE — Patient Instructions (Signed)
Continue weighing daily and call for an overnight weight gain of > 2 pounds or a weekly weight gain of >5 pounds. 

## 2015-03-01 NOTE — Progress Notes (Signed)
   Subjective:    Patient ID: Sharon Frazier, female    DOB: [birth year], 81 y.o.   MRN: [id number]  Congestive Heart Failure Presents for follow-up visit. The disease course has been stable. Associated symptoms include fatigue, orthopnea (sleeps on 3 pillows) and shortness of breath (when lying flat. wears oxygen @ 2L around the clock). Pertinent negatives include no abdominal pain, chest pain, edema or palpitations. The symptoms have been stable. Past treatments include ACE inhibitors, beta blockers and salt and fluid restriction. The treatment provided significant relief. Compliance with prior treatments has been good. Her past medical history is significant for anemia and DM.      Review of Systems  Constitutional: Positive for fatigue. Negative for appetite change.  HENT: Positive for trouble swallowing (rarely). Negative for sore throat.   Eyes: Positive for itching ("eyes itching"). Negative for redness.  Respiratory: Positive for cough (on occasion) and shortness of breath (when lying flat. wears oxygen @ 2L around the clock). Negative for wheezing.   Cardiovascular: Negative for chest pain, palpitations and leg swelling.  Gastrointestinal: Positive for constipation. Negative for abdominal pain and abdominal distention.  Endocrine: Negative.   Genitourinary: Negative.   Musculoskeletal: Negative for back pain and neck stiffness.  Skin: Negative.   Allergic/Immunologic: Negative.   Neurological: Negative for dizziness and numbness.  Hematological: Negative for adenopathy. Bruises/bleeds easily.  Psychiatric/Behavioral: Negative for sleep disturbance (sleeping on 3 pillows).       Objective:   Physical Exam  Constitutional: She is oriented to person, place, and time. She appears well-developed and well-nourished.  HENT:  Head: Normocephalic and atraumatic.  Eyes: Conjunctivae are normal. Pupils are equal, round, and reactive to light.  Neck: Normal range of motion. Neck  supple.  Cardiovascular: Normal rate and regular rhythm.   Pulmonary/Chest: Effort normal and breath sounds normal. She has no wheezes. She has no rales.  Abdominal: Soft. She exhibits no distension. There is no tenderness.  Musculoskeletal: She exhibits no edema or tenderness.  Neurological: She is alert and oriented to person, place, and time.  Skin: Skin is warm and dry.  Psychiatric: She has a normal mood and affect. Her behavior is normal.  Nursing note and vitals reviewed.         Assessment & Plan:  1: Chronic heart failure with preserved ejection fraction- Patient presents without any shortness of breath unless she tries to lay down flat so she sleeps chronically on 3 pillows. She does get tired with exertion but says that when she sits down to rest, she recovers quickly. She continues to weigh daily and says that her weight has been stable. She is not adding any salt to her food and tries to follow a low sodium diet. She says that she doesn't drink much water but says that she drinks diet soda. 2: Diabetes- She says that her PCP checks her glucose for her so she doesn't check it at home. Discussed the importance of checking it at home but she says that she'll just keep letting her PCP do it for her. Continues to take glipizide. 3: Anemia- She continues to take her iron twice daily and says that she's been constipated. Has been taking a stool softener on occasion but without much relief. Encouraged her to speak with her PCP in this regard.  Return in 6 months or sooner for any questions/problems before then.

## 2015-03-16 ENCOUNTER — Encounter: Payer: Self-pay | Admitting: Cardiovascular Disease

## 2015-03-16 ENCOUNTER — Ambulatory Visit (INDEPENDENT_AMBULATORY_CARE_PROVIDER_SITE_OTHER): Payer: Medicare Other | Admitting: Cardiovascular Disease

## 2015-03-16 VITALS — BP 126/60 | HR 68 | Ht 59.0 in | Wt 92.8 lb

## 2015-03-16 DIAGNOSIS — R0789 Other chest pain: Secondary | ICD-10-CM

## 2015-03-16 DIAGNOSIS — E785 Hyperlipidemia, unspecified: Secondary | ICD-10-CM

## 2015-03-16 DIAGNOSIS — I5032 Chronic diastolic (congestive) heart failure: Secondary | ICD-10-CM | POA: Diagnosis not present

## 2015-03-16 DIAGNOSIS — I4891 Unspecified atrial fibrillation: Secondary | ICD-10-CM | POA: Diagnosis not present

## 2015-03-16 DIAGNOSIS — K2971 Gastritis, unspecified, with bleeding: Secondary | ICD-10-CM

## 2015-03-16 NOTE — Assessment & Plan Note (Signed)
Chronic atrial fibrillation, rate well controlled. Again discussed anticoagulation with her. She is not interested given her high risk of GI bleeding

## 2015-03-16 NOTE — Progress Notes (Signed)
Patient ID: Sharon Frazier, female    DOB: Oct 12, 1921, 81 y.o.   MRN: [id number]  HPI Comments: Ms Haberkorn is a pleasant 81 year old woman with history of GI bleed, iron deficiency anemia, laser treatment of gastric angiodysplasias , peptic ulcer disease, GERD, diverticulosis and internal hemorrhoids, hypertension, hyperlipidemia, type 2 diabetes, previous history of tobacco abuse, right breast cancer status post mastectomy and chemotherapy with  hospital admission 12/11/2013 and discharged 12/14/2013 for new onset atrial fibrillation. she developed bronchitis with weakness and cough with sputum.  In the emergency room she was found to have atrial fibrillation with RVR. She was started on rate control medications and she was felt to be a poor candidate for anticoagulation. Unclear exactly how long she had been in atrial fibrillation. She presents today for routine follow-up of her atrial fibrillation She is followed by Dr. Hardin Negus who closely monitors her lab work. Continues to take iron 2 pills per day She states labs have shown stable blood count She has no appetite, eats breakfast and then little more for the rest of the day. Just no desire to eat She takes Lasix 40 mg daily  walks with a cane, no recent falls Overall no new complaints  EKG on today's visit shows atrial fibrillation with ventricular rate 68 bpm  Other past medical history  in the hospital at the end of July 2015. She presented with shortness of breath and leg swelling. She had pleural effusion. She was started on Lasix with aggressive diuresis. Discharged on Lasix 40 mg twice a day. She was not on Lasix prior to her admission She did have thoracentesis on the right during her hospital stay. Discharge on 04/24/2014. She did have one unit packed red blood cells Prior echocardiogram with ejection fraction 65%.  In the hospital CT scan of the chest showing possible bronchopneumonia, moderate bilateral pleural effusions,  right greater than left, atherosclerosis of the coronary arteries, emphysema, cirrhosis, abdominal ascites  seen in the heart failure clinic 05/25/2014. No medication changes made at that time.  Echocardiogram showed ejection fraction 60-65%, mildly elevated right ventricular systolic pressures Notes indicate she had been seen by Dr. Vira Agar recently with EGD and electrocautery. She still had trace blood in her stool on followup visits with him. She was started on diltiazem 120 mg daily, amlodipine was held, she was started on metoprolol   Allergies  Allergen Reactions  . Alphagan [Brimonidine]     Arm numbness      Outpatient Encounter Prescriptions as of 03/16/2015  Medication Sig  . aspirin 81 MG tablet Take 81 mg by mouth daily.  Marland Kitchen diltiazem (CARDIZEM) 120 MG tablet Take 1 tablet (120 mg total) by mouth daily.  . dorzolamide (TRUSOPT) 2 % ophthalmic solution 1 drop 2 (two) times daily.  . furosemide (LASIX) 40 MG tablet Take 1 tablet (40 mg total) by mouth 2 (two) times daily as needed. (Patient taking differently: Take 40 mg by mouth daily. )  . glipiZIDE (GLUCOTROL XL) 2.5 MG 24 hr tablet Take 2.5 mg by mouth daily with breakfast.  . iron polysaccharides (NIFEREX) 150 MG capsule Take 150 mg by mouth 2 (two) times daily.  Marland Kitchen latanoprost (XALATAN) 0.005 % ophthalmic solution Place 1 drop into both eyes at bedtime.  Marland Kitchen lisinopril-hydrochlorothiazide (PRINZIDE,ZESTORETIC) 10-12.5 MG per tablet Take 1 tablet by mouth daily.  . metoprolol tartrate (LOPRESSOR) 25 MG tablet Take 1 tablet (25 mg total) by mouth 2 (two) times daily.  Marland Kitchen omeprazole (PRILOSEC) 20 MG capsule Take  20 mg by mouth 2 (two) times daily before a meal.   . potassium chloride SA (K-DUR,KLOR-CON) 20 MEQ tablet Take 20 mEq by mouth daily.  . [DISCONTINUED] metoprolol tartrate (LOPRESSOR) 25 MG tablet TAKE ONE TABLET BY MOUTH TWICE DAILY (Patient not taking: Reported on 03/16/2015)  . [DISCONTINUED] potassium chloride  (K-DUR,KLOR-CON) 10 MEQ tablet Take 10 mEq by mouth 2 (two) times daily.   No facility-administered encounter medications on file as of 03/16/2015.    Past Medical History  Diagnosis Date  . H/O: GI bleed   . GERD (gastroesophageal reflux disease)   . Diverticulosis   . HTN (hypertension)   . Hyperlipidemia   . Diabetes mellitus without complication   . PUD (peptic ulcer disease)   . Palpitations   . A-fib   . Glaucoma   . PVD (peripheral vascular disease)   . CHF (congestive heart failure)   . Breast cancer     right    Past Surgical History  Procedure Laterality Date  . Mastectomy      right breast   . Hip surgery      left hip  . Renal artery stent      Social History  reports that she quit smoking about 27 years ago. Her smoking use included Cigarettes. She has a 50 pack-year smoking history. She has never used smokeless tobacco. She reports that she does not drink alcohol or use illicit drugs.  Family History family history includes Cancer in her sister.   Review of Systems  HENT: Negative.   Respiratory: Negative.   Cardiovascular: Negative.   Gastrointestinal: Negative.   Musculoskeletal: Positive for gait problem.       Left leg weakness  Neurological: Positive for weakness.  Hematological: Negative.   Psychiatric/Behavioral: Negative.   All other systems reviewed and are negative.  BP 126/60 mmHg  Pulse 68  Ht 4\' 11"  (1.499 m)  Wt 92 lb 12 oz (42.071 kg)  BMI 18.72 kg/m2  LMP  (LMP Unknown)  Physical Exam  Constitutional: She is oriented to person, place, and time.  Thin, presenting in a wheelchair, pale  HENT:  Head: Normocephalic.  Nose: Nose normal.  Mouth/Throat: Oropharynx is clear and moist.  Eyes: Conjunctivae are normal. Pupils are equal, round, and reactive to light.  Neck: Normal range of motion. Neck supple. No JVD present.  Cardiovascular: Normal rate, regular rhythm, S1 normal, S2 normal, normal heart sounds and intact distal  pulses.  Exam reveals no gallop and no friction rub.   No murmur heard. Pulmonary/Chest: Effort normal and breath sounds normal. No respiratory distress. She has no wheezes. She has no rales. She exhibits no tenderness.  Abdominal: Soft. Bowel sounds are normal. She exhibits no distension. There is no tenderness.  Musculoskeletal: Normal range of motion. She exhibits no edema or tenderness.  Lymphadenopathy:    She has no cervical adenopathy.  Neurological: She is alert and oriented to person, place, and time. Coordination normal.  Skin: Skin is warm and dry. No rash noted. No erythema.  Psychiatric: She has a normal mood and affect. Her behavior is normal. Judgment and thought content normal.    Assessment and Plan  Nursing note and vitals reviewed.

## 2015-03-16 NOTE — Assessment & Plan Note (Signed)
No recent GI bleed. She is not interested in anticoagulation. Risk and benefit discussed with her. She is aware of risk of stroke without blood thinners

## 2015-03-16 NOTE — Patient Instructions (Addendum)
You are doing well. Please decrease the lasix/furosemide down to every other day Take extra lasix for shortness of breath or leg edema/swelling  Please call us if you have new issues that need to be addressed before your next appt.  Your physician wants you to follow-up in: 6 months.  You will receive a reminder letter in the mail two months in advance. If you don't receive a letter, please call our office to schedule the follow-up appointment.

## 2015-03-16 NOTE — Assessment & Plan Note (Signed)
She is taking Lasix 40 mg daily. No signs of fluid. Appears relatively euvolemic. Recommended she try Lasix every other day with extra Lasix for leg edema or abdominal bloating. Dr. Hardin Negus monitors her blood work

## 2015-03-16 NOTE — Assessment & Plan Note (Signed)
Recommended that she stay on her simvastatin. Significant coronary disease seen on CT scan

## 2015-05-28 ENCOUNTER — Other Ambulatory Visit: Payer: Self-pay | Admitting: *Deleted

## 2015-05-28 MED ORDER — METOPROLOL TARTRATE 25 MG PO TABS: 25.0000 mg | ORAL_TABLET | Freq: Two times a day (BID) | ORAL | Status: DC

## 2015-07-27 ENCOUNTER — Encounter: Payer: Self-pay | Admitting: Sports Medicine

## 2015-07-27 ENCOUNTER — Ambulatory Visit (INDEPENDENT_AMBULATORY_CARE_PROVIDER_SITE_OTHER): Payer: Medicare Other | Admitting: Sports Medicine

## 2015-07-27 DIAGNOSIS — M79673 Pain in unspecified foot: Secondary | ICD-10-CM

## 2015-07-27 DIAGNOSIS — B351 Tinea unguium: Secondary | ICD-10-CM | POA: Diagnosis not present

## 2015-07-27 DIAGNOSIS — E119 Type 2 diabetes mellitus without complications: Secondary | ICD-10-CM

## 2015-07-27 NOTE — Progress Notes (Signed)
Patient ID: Girtha Rm, female   DOB: 12/26/1921, 81 y.o.   MRN: [id number] Subjective: Sharon Frazier is a 81 y.o. female patient with history of type 2 diabetes who presents to office today complaining of long, painful nails  while ambulating in shoes; unable to trim. Patient states that the glucose reading this morning was 96 mg/dl. Patient denies any new changes in medication or new problems. Patient denies any new cramping, numbness, burning or tingling in the legs.  Patient Active Problem List   Diagnosis Date Noted  . Chronic diastolic CHF (congestive heart failure) ([place]) 06/19/2014  . A-fib ([place]) 12/25/2013  . Diabetes mellitus type 2, controlled ([place]) 12/25/2013  . Anemia 12/25/2013  . GI bleed 12/25/2013  . Hyperlipidemia 12/25/2013   Current Outpatient Prescriptions on File Prior to Visit  Medication Sig Dispense Refill  . aspirin 81 MG tablet Take 81 mg by mouth daily.    Marland Kitchen diltiazem (CARDIZEM) 120 MG tablet Take 1 tablet (120 mg total) by mouth daily. 30 tablet 11  . dorzolamide (TRUSOPT) 2 % ophthalmic solution 1 drop 2 (two) times daily.    . furosemide (LASIX) 40 MG tablet Take 1 tablet (40 mg total) by mouth 2 (two) times daily as needed. (Patient taking differently: Take 40 mg by mouth daily. ) 60 tablet 6  . glipiZIDE (GLUCOTROL XL) 2.5 MG 24 hr tablet Take 2.5 mg by mouth daily with breakfast.    . iron polysaccharides (NIFEREX) 150 MG capsule Take 150 mg by mouth 2 (two) times daily.    Marland Kitchen latanoprost (XALATAN) 0.005 % ophthalmic solution Place 1 drop into both eyes at bedtime.    Marland Kitchen lisinopril-hydrochlorothiazide (PRINZIDE,ZESTORETIC) 10-12.5 MG per tablet Take 1 tablet by mouth daily.    . metoprolol tartrate (LOPRESSOR) 25 MG tablet Take 1 tablet (25 mg total) by mouth 2 (two) times daily. 60 tablet 3  . omeprazole (PRILOSEC) 20 MG capsule Take 20 mg by mouth 2 (two) times daily before a meal.     . potassium chloride SA (K-DUR,KLOR-CON) 20 MEQ tablet Take 20  mEq by mouth daily.     No current facility-administered medications on file prior to visit.   Allergies  Allergen Reactions  . Alphagan [Brimonidine]     Arm numbness      Labs: HEMOGLOBIN A1C 6.6mg /dl  Objective: General: Patient is awake, alert, and oriented x 3 and in no acute distress.  Integument: Skin is warm, dry and supple bilateral. Nails are tender, long, thickened and  dystrophic with subungual debris, consistent with onychomycosis, 1-5 bilateral. No signs of infection. No open lesions or preulcerative lesions present bilateral. Remaining integument unremarkable.  Vasculature:  Dorsalis Pedis pulse 1/4 bilateral. Posterior Tibial pulse  1/4 bilateral.  Capillary fill time <3 sec 1-5 bilateral. Scant hair growth to the level of the digits. Temperature gradient within normal limits. Mild varicosities present bilateral. No edema present bilateral.   Neurology: The patient has intact sensation measured with a 5.07/10g Semmes Weinstein Monofilament at all pedal sites bilateral . Vibratory sensation diminished bilateral with tuning fork. No Babinski sign present bilateral.   Musculoskeletal: No gross pedal deformities noted bilateral. Muscular strength 5/5 in all lower extremity muscular groups bilateral without pain or limitation on range of motion . No tenderness with calf compression bilateral.  Assessment and Plan: Problem List Items Addressed This Visit    None    Visit Diagnoses    Dermatophytosis of nail    -  Primary  Foot pain, unspecified laterality        Diabetes mellitus without complication ([place])          -Examined patient. -Discussed and educated patient on diabetic foot care, especially with  regards to the vascular, neurological and musculoskeletal systems.  -Stressed the importance of good glycemic control and the detriment of not  controlling glucose levels in relation to the foot. -Mechanically debrided all nails 1-5 bilateral using sterile nail  nipper and filed with dremel without incident  -Answered all patient questions -Patient to return in 3 months for at risk foot care -Patient advised to call the office if any problems or questions arise in the  Meantime.  Landis Martins, DPM

## 2015-08-16 ENCOUNTER — Telehealth: Payer: Self-pay | Admitting: *Deleted

## 2015-08-16 MED ORDER — FUROSEMIDE 40 MG PO TABS: 40.0000 mg | ORAL_TABLET | Freq: Two times a day (BID) | ORAL | Status: DC | PRN

## 2015-08-16 NOTE — Addendum Note (Signed)
Addended by: Derl Barrow on: 08/16/2015 11:11 AM   Modules accepted: Orders

## 2015-08-16 NOTE — Telephone Encounter (Signed)
Pt's Rx sent to pt's pharmacy as requested. Confirmation received.  °

## 2015-08-16 NOTE — Telephone Encounter (Signed)
°*  STAT* If patient is at the pharmacy, call can be transferred to refill team.   1. Which medications need to be refilled? (please list name of each medication and dose if known) Frusemide   2. Which pharmacy/location (including street and city if local pharmacy) is medication to be sent to? [place]   3. Do they need a 30 day or 90 day supply? 90 day

## 2015-08-31 ENCOUNTER — Ambulatory Visit: Payer: Medicare Other | Attending: Family | Admitting: Family

## 2015-08-31 ENCOUNTER — Encounter: Payer: Self-pay | Admitting: Family

## 2015-08-31 VITALS — BP 137/38 | HR 60 | Resp 18 | Ht 60.0 in | Wt 93.0 lb

## 2015-08-31 DIAGNOSIS — Z7982 Long term (current) use of aspirin: Secondary | ICD-10-CM | POA: Diagnosis not present

## 2015-08-31 DIAGNOSIS — I739 Peripheral vascular disease, unspecified: Secondary | ICD-10-CM | POA: Insufficient documentation

## 2015-08-31 DIAGNOSIS — I10 Essential (primary) hypertension: Secondary | ICD-10-CM | POA: Diagnosis not present

## 2015-08-31 DIAGNOSIS — M545 Low back pain, unspecified: Secondary | ICD-10-CM

## 2015-08-31 DIAGNOSIS — Z79899 Other long term (current) drug therapy: Secondary | ICD-10-CM | POA: Insufficient documentation

## 2015-08-31 DIAGNOSIS — I482 Chronic atrial fibrillation, unspecified: Secondary | ICD-10-CM

## 2015-08-31 DIAGNOSIS — I5032 Chronic diastolic (congestive) heart failure: Secondary | ICD-10-CM | POA: Diagnosis present

## 2015-08-31 DIAGNOSIS — I4891 Unspecified atrial fibrillation: Secondary | ICD-10-CM | POA: Diagnosis not present

## 2015-08-31 DIAGNOSIS — E785 Hyperlipidemia, unspecified: Secondary | ICD-10-CM | POA: Diagnosis not present

## 2015-08-31 DIAGNOSIS — Z853 Personal history of malignant neoplasm of breast: Secondary | ICD-10-CM | POA: Insufficient documentation

## 2015-08-31 DIAGNOSIS — Z87891 Personal history of nicotine dependence: Secondary | ICD-10-CM | POA: Diagnosis not present

## 2015-08-31 DIAGNOSIS — K219 Gastro-esophageal reflux disease without esophagitis: Secondary | ICD-10-CM | POA: Diagnosis not present

## 2015-08-31 DIAGNOSIS — E119 Type 2 diabetes mellitus without complications: Secondary | ICD-10-CM | POA: Insufficient documentation

## 2015-08-31 DIAGNOSIS — R0602 Shortness of breath: Secondary | ICD-10-CM | POA: Diagnosis present

## 2015-08-31 LAB — BASIC METABOLIC PANEL
Anion gap: 6 (ref 5–15)
BUN: 37 mg/dL — AB (ref 6–20)
CO2: 23 mmol/L (ref 22–32)
Calcium: 9.6 mg/dL (ref 8.9–10.3)
Chloride: 112 mmol/L — ABNORMAL HIGH (ref 101–111)
Creatinine, Ser: 1.54 mg/dL — ABNORMAL HIGH (ref 0.44–1.00)
GFR calc Af Amer: 33 mL/min — ABNORMAL LOW (ref >60)
GFR, EST NON AFRICAN AMERICAN: 28 mL/min — AB (ref >60)
GLUCOSE: 134 mg/dL — AB (ref 65–99)
POTASSIUM: 4.4 mmol/L (ref 3.5–5.1)
SODIUM: 141 mmol/L (ref 135–145)

## 2015-08-31 LAB — HEMOGLOBIN A1C: Hgb A1c MFr Bld: 5.7 % (ref 4.0–6.0)

## 2015-08-31 NOTE — Patient Instructions (Signed)
Continue weighing daily and call for an overnight weight gain of > 2 pounds or a weekly weight gain of >5 pounds. 

## 2015-08-31 NOTE — Progress Notes (Signed)
Subjective:    Patient ID: Girtha Rm, female    DOB: 1922/04/06, 81 y.o.   MRN: [id number]  Congestive Heart Failure Presents for follow-up visit. The disease course has been stable. Associated symptoms include fatigue and shortness of breath (sometimes). Pertinent negatives include no abdominal pain, chest pain, chest pressure, edema, orthopnea or palpitations. The symptoms have been stable. Past treatments include ACE inhibitors, salt and fluid restriction, beta blockers and oxygen. The treatment provided significant relief. Compliance with prior treatments has been good. Her past medical history is significant for arrhythmia, DM and HTN. Compliance with total regimen is 76-100%.  Back Pain This is a new problem. The current episode started 1 to 4 weeks ago. The problem occurs daily. The problem is unchanged. The pain is present in the lumbar spine. The quality of the pain is described as aching. The pain does not radiate. The pain is at a severity of 7/10. The pain is moderate. The pain is worse during the night. The symptoms are aggravated by sitting. Pertinent negatives include no abdominal pain, chest pain, headaches, numbness or paresthesias. Risk factors include poor posture and menopause. She has tried nothing for the symptoms.   Past Medical History  Diagnosis Date  . H/O: GI bleed   . GERD (gastroesophageal reflux disease)   . Diverticulosis   . HTN (hypertension)   . Hyperlipidemia   . Diabetes mellitus without complication ([place])   . PUD (peptic ulcer disease)   . Palpitations   . A-fib ([place])   . Glaucoma   . PVD (peripheral vascular disease) ([place])   . CHF (congestive heart failure) ([place])   . Breast cancer [facility])     right    Past Surgical History  Procedure Laterality Date  . Mastectomy      right breast   . Hip surgery      left hip  . Renal artery stent      Family History  Problem Relation Age of Onset  . Cancer Sister     Colon cancer    Social  History  Substance Use Topics  . Smoking status: Former Smoker -- 2.00 packs/day for 25 years    Types: Cigarettes    Quit date: 11/07/1987  . Smokeless tobacco: Never Used  . Alcohol Use: No    Allergies  Allergen Reactions  . Alphagan [Brimonidine]     Arm numbness      Prior to Admission medications   Medication Sig Start Date End Date Taking? Authorizing Provider  aspirin 81 MG tablet Take 81 mg by mouth daily.   Yes Historical Provider, MD  diltiazem (CARDIZEM) 120 MG tablet Take 1 tablet (120 mg total) by mouth daily. 12/25/13  Yes Minna Merritts, MD  dorzolamide (TRUSOPT) 2 % ophthalmic solution 1 drop 2 (two) times daily.   Yes Historical Provider, MD  furosemide (LASIX) 40 MG tablet Take 1 tablet (40 mg total) by mouth 2 (two) times daily as needed. 08/16/15  Yes Minna Merritts, MD  glipiZIDE (GLUCOTROL XL) 2.5 MG 24 hr tablet Take 2.5 mg by mouth daily with breakfast.   Yes Historical Provider, MD  iron polysaccharides (NIFEREX) 150 MG capsule Take 150 mg by mouth 2 (two) times daily.   Yes Historical Provider, MD  latanoprost (XALATAN) 0.005 % ophthalmic solution Place 1 drop into both eyes at bedtime.   Yes Historical Provider, MD  lisinopril-hydrochlorothiazide (PRINZIDE,ZESTORETIC) 10-12.5 MG per tablet Take 1 tablet by mouth daily.   Yes  Historical Provider, MD  metoprolol tartrate (LOPRESSOR) 25 MG tablet Take 1 tablet (25 mg total) by mouth 2 (two) times daily. 05/28/15  Yes Minna Merritts, MD  omeprazole (PRILOSEC) 20 MG capsule Take 20 mg by mouth 2 (two) times daily before a meal.    Yes Historical Provider, MD  potassium chloride SA (K-DUR,KLOR-CON) 20 MEQ tablet Take 20 mEq by mouth daily.   Yes Historical Provider, MD      Review of Systems  Constitutional: Positive for fatigue. Negative for appetite change.  HENT: Positive for rhinorrhea (with oxygen). Negative for congestion and sore throat.   Eyes: Negative.   Respiratory: Positive for cough (not much)  and shortness of breath (sometimes). Negative for chest tightness.   Cardiovascular: Negative for chest pain, palpitations and leg swelling.  Gastrointestinal: Negative for abdominal pain and abdominal distention.  Endocrine: Negative.   Genitourinary: Negative.   Musculoskeletal: Positive for back pain (last 1 month). Negative for neck pain.  Skin: Negative.   Allergic/Immunologic: Negative.   Neurological: Negative for dizziness, light-headedness, numbness, headaches and paresthesias.  Hematological: Negative for adenopathy. Bruises/bleeds easily.  Psychiatric/Behavioral: Negative for sleep disturbance (sleeping on 3 pillows) and dysphoric mood. The patient is not nervous/anxious.        Objective:   Physical Exam  Constitutional: She is oriented to person, place, and time. She appears well-developed and well-nourished.  HENT:  Head: Normocephalic and atraumatic.  Eyes: Conjunctivae are normal. Pupils are equal, round, and reactive to light.  Neck: Normal range of motion. Neck supple.  Cardiovascular: An irregular rhythm present. Bradycardia present.   Pulmonary/Chest: Effort normal. She has no wheezes. She has no rales.  Abdominal: Soft. She exhibits no distension. There is no tenderness.  Musculoskeletal: She exhibits no edema or tenderness.  Neurological: She is alert and oriented to person, place, and time.  Skin: Skin is warm and dry.  Psychiatric: She has a normal mood and affect. Her behavior is normal. Thought content normal.  Nursing note and vitals reviewed.   BP 137/38 mmHg  Pulse 60  Resp 18  Ht 5' (1.524 m)  Wt 93 lb (42.185 kg)  BMI 18.16 kg/m2  SpO2 100%  LMP  (LMP Unknown)       Assessment & Plan:  1: Chronic heart failure with preserved ejection fraction- Patient presents with fatigue and intermittent shortness of breath upon exertion. When she does experience symptoms, she will stop what she's doing to rest until her symptoms subside. She continues to  weigh herself daily and reports a stable weight. Weight is unchanged by our scale. Reminded to call for an overnight weight gain of >2 pounds or a weekly weight gain of >5 pounds. She is not adding any salt to her food and is trying to closely follow a low sodium diet. She has received her flu vaccine and shingles vaccine already this year. Hasn't had lab work checked recently because her PCP has been sick so will get a chemistry panel drawn today. 2: Diabetes- She does take glipizide but doesn't check her glucose at home as she says that her PCP "always does it". However, she hasn't been seen for a few months due to PCP's illness so will get a HgA1c drawn today as well. Patient is agreeable to this. 3: Atrial fibrillation- Currently rate controlled at this time with diltiazem and metoprolol.  4: Back pain- She says that this started about 1 month ago. She does say that she likes to sit on the side  of the bed doing crossword puzzles and doesn't have any support behind her back. She says that she takes tylenol which provides minimal relief. Encouraged her to prop herself up with pillows behind her back along with using a table across her lap. Should the pain continue, she is to inform her PCP's office.  Return here in 6 months or sooner for any questions/problems before then.

## 2015-09-09 ENCOUNTER — Ambulatory Visit (INDEPENDENT_AMBULATORY_CARE_PROVIDER_SITE_OTHER): Payer: Medicare Other | Admitting: Cardiovascular Disease

## 2015-09-09 ENCOUNTER — Encounter: Payer: Self-pay | Admitting: Cardiovascular Disease

## 2015-09-09 VITALS — BP 125/69 | HR 65 | Ht 60.0 in | Wt 93.2 lb

## 2015-09-09 DIAGNOSIS — I482 Chronic atrial fibrillation, unspecified: Secondary | ICD-10-CM

## 2015-09-09 DIAGNOSIS — K2971 Gastritis, unspecified, with bleeding: Secondary | ICD-10-CM

## 2015-09-09 DIAGNOSIS — I5032 Chronic diastolic (congestive) heart failure: Secondary | ICD-10-CM | POA: Diagnosis not present

## 2015-09-09 MED ORDER — FUROSEMIDE 20 MG PO TABS: 20.0000 mg | ORAL_TABLET | Freq: Every day | ORAL | Status: DC | PRN

## 2015-09-09 NOTE — Progress Notes (Signed)
Patient ID: Girtha Rm, female    DOB: [birth year], 81 y.o.   MRN: [id number]  HPI Comments: Ms Choice is a pleasant 81 year old woman with history of GI bleed, iron deficiency anemia, laser treatment of gastric angiodysplasias , peptic ulcer disease, GERD, diverticulosis and internal hemorrhoids, hypertension, hyperlipidemia, type 2 diabetes, previous history of tobacco abuse, right breast cancer status post mastectomy and chemotherapy with  hospital admission 12/11/2013 and discharged 12/14/2013 for new onset atrial fibrillation. she developed bronchitis with weakness and cough with sputum.  In the emergency room she was found to have atrial fibrillation with RVR. She was started on rate control medications and she was felt to be a poor candidate for anticoagulation. Unclear exactly how long she had been in atrial fibrillation. She presents today for routine follow-up of her atrial fibrillation   in follow-up today, she has been taking Lasix daily, 20 mg On her last clinic visit, we had recommended she take Lasix every other day She does not drink much fluids, Recent dramatic climb in her creatinine up to 1.5 with elevated BUN Otherwise she denies any issues, weight has been stable at home, 92 pounds. Husband does all of her medications, puts them in a pillbox  Denies any tachycardia, chest discomfort As on her last clinic visit, She has no appetite, eats breakfast and then little more for the rest of the day. Just no desire to eat  walks with a cane, no recent falls  EKG on today's visit shows atrial fibrillation with ventricular rate 65 bpm  Other past medical history  in the hospital at the end of July 2015. She presented with shortness of breath and leg swelling. She had pleural effusion. She was started on Lasix with aggressive diuresis. Discharged on Lasix 40 mg twice a day. She was not on Lasix prior to her admission She did have thoracentesis on the right during her hospital  stay. Discharge on 04/24/2014. She did have one unit packed red blood cells Prior echocardiogram with ejection fraction 65%.  In the hospital CT scan of the chest showing possible bronchopneumonia, moderate bilateral pleural effusions, right greater than left, atherosclerosis of the coronary arteries, emphysema, cirrhosis, abdominal ascites  seen in the heart failure clinic 05/25/2014. No medication changes made at that time.  Echocardiogram showed ejection fraction 60-65%, mildly elevated right ventricular systolic pressures Notes indicate she had been seen by Dr. Vira Agar recently with EGD and electrocautery. She still had trace blood in her stool on followup visits with him. She was started on diltiazem 120 mg daily, amlodipine was held, she was started on metoprolol   Allergies  Allergen Reactions  . Alphagan [Brimonidine]     Arm numbness      Outpatient Encounter Prescriptions as of 09/09/2015  Medication Sig  . aspirin 81 MG tablet Take 81 mg by mouth daily.  Marland Kitchen diltiazem (CARDIZEM) 120 MG tablet Take 1 tablet (120 mg total) by mouth daily.  . dorzolamide (TRUSOPT) 2 % ophthalmic solution 1 drop 2 (two) times daily.  . furosemide (LASIX) 20 MG tablet Take 1 tablet (20 mg total) by mouth daily as needed.  Marland Kitchen glipiZIDE (GLUCOTROL XL) 2.5 MG 24 hr tablet Take 2.5 mg by mouth daily with breakfast.  . iron polysaccharides (NIFEREX) 150 MG capsule Take 150 mg by mouth 2 (two) times daily.  Marland Kitchen latanoprost (XALATAN) 0.005 % ophthalmic solution Place 1 drop into both eyes at bedtime.  Marland Kitchen lisinopril-hydrochlorothiazide (PRINZIDE,ZESTORETIC) 10-12.5 MG per tablet Take 1 tablet by  mouth daily.  . metoprolol tartrate (LOPRESSOR) 25 MG tablet Take 1 tablet (25 mg total) by mouth 2 (two) times daily.  Marland Kitchen omeprazole (PRILOSEC) 20 MG capsule Take 20 mg by mouth 2 (two) times daily before a meal.   . potassium chloride SA (K-DUR,KLOR-CON) 20 MEQ tablet Take 20 mEq by mouth daily.  . [DISCONTINUED]  furosemide (LASIX) 40 MG tablet Take 1 tablet (40 mg total) by mouth 2 (two) times daily as needed.   No facility-administered encounter medications on file as of 09/09/2015.    Past Medical History  Diagnosis Date  . H/O: GI bleed   . GERD (gastroesophageal reflux disease)   . Diverticulosis   . HTN (hypertension)   . Hyperlipidemia   . Diabetes mellitus without complication ([place])   . PUD (peptic ulcer disease)   . Palpitations   . A-fib ([place])   . Glaucoma   . PVD (peripheral vascular disease) ([place])   . CHF (congestive heart failure) ([place])   . Breast cancer [facility])     right    Past Surgical History  Procedure Laterality Date  . Mastectomy      right breast   . Hip surgery      left hip  . Renal artery stent      Social History  reports that she quit smoking about 27 years ago. Her smoking use included Cigarettes. She has a 50 pack-year smoking history. She has never used smokeless tobacco. She reports that she does not drink alcohol or use illicit drugs.  Family History family history includes Cancer in her sister.   Review of Systems  HENT: Negative.   Respiratory: Negative.   Cardiovascular: Negative.   Gastrointestinal: Negative.   Musculoskeletal: Positive for gait problem.       Left leg weakness  Neurological: Positive for weakness.  Hematological: Negative.   Psychiatric/Behavioral: Negative.   All other systems reviewed and are negative.  BP 125/69 mmHg  Pulse 65  Ht 5' (1.524 m)  Wt 93 lb 4 oz (42.298 kg)  BMI 18.21 kg/m2  LMP  (LMP Unknown)  Physical Exam  Constitutional: She is oriented to person, place, and time.  Thin, presenting in a wheelchair, pale  HENT:  Head: Normocephalic.  Nose: Nose normal.  Mouth/Throat: Oropharynx is clear and moist.  Eyes: Conjunctivae are normal. Pupils are equal, round, and reactive to light.  Neck: Normal range of motion. Neck supple. No JVD present.  Cardiovascular: Normal rate, regular rhythm, S1 normal,  S2 normal, normal heart sounds and intact distal pulses.  Exam reveals no gallop and no friction rub.   No murmur heard. Pulmonary/Chest: Effort normal and breath sounds normal. No respiratory distress. She has no wheezes. She has no rales. She exhibits no tenderness.  Abdominal: Soft. Bowel sounds are normal. She exhibits no distension. There is no tenderness.  Musculoskeletal: Normal range of motion. She exhibits no edema or tenderness.  Lymphadenopathy:    She has no cervical adenopathy.  Neurological: She is alert and oriented to person, place, and time. Coordination normal.  Skin: Skin is warm and dry. No rash noted. No erythema.  Psychiatric: She has a normal mood and affect. Her behavior is normal. Judgment and thought content normal.    Assessment and Plan  Nursing note and vitals reviewed.

## 2015-09-09 NOTE — Assessment & Plan Note (Signed)
She denies any recent GI bleed Still does not want anticoagulation as detailed in previous notes

## 2015-09-09 NOTE — Assessment & Plan Note (Signed)
Dramatic climb in her creatinine consistent with dehydration. Recommended she hold Lasix and potassium for 1 week. Then would only take Lasix 20 mg 3 days per week with potassium Encouraged her to drink more fluids

## 2015-09-09 NOTE — Patient Instructions (Addendum)
You are doing well.  Please stop the lasix and potassium  for one week On Dec 22nd, Start  lasix 20 mg Monday/Wednesday/Friday with 1/2 potassium  Please call us if you have new issues that need to be addressed before your next appt.  Your physician wants you to follow-up in: 6 months.  You will receive a reminder letter in the mail two months in advance. If you don't receive a letter, please call our office to schedule the follow-up appointment.

## 2015-09-09 NOTE — Assessment & Plan Note (Signed)
Chronic atrial fibrillation, rate well controlled.  discussed anticoagulation with her on several prior visits. She was not interested given her high risk of GI bleeding

## 2015-09-20 ENCOUNTER — Other Ambulatory Visit: Payer: Self-pay | Admitting: Cardiovascular Disease

## 2015-10-11 ENCOUNTER — Other Ambulatory Visit (INDEPENDENT_AMBULATORY_CARE_PROVIDER_SITE_OTHER): Payer: Medicare Other | Admitting: *Deleted

## 2015-10-11 DIAGNOSIS — I482 Chronic atrial fibrillation, unspecified: Secondary | ICD-10-CM

## 2015-10-12 LAB — BASIC METABOLIC PANEL
BUN/Creatinine Ratio: 26 (ref 11–26)
BUN: 31 mg/dL (ref 10–36)
CO2: 22 mmol/L (ref 18–29)
CREATININE: 1.17 mg/dL — AB (ref 0.57–1.00)
Calcium: 9.4 mg/dL (ref 8.7–10.3)
Chloride: 104 mmol/L (ref 96–106)
GFR calc Af Amer: 47 mL/min/{1.73_m2} — ABNORMAL LOW (ref >59)
GFR, EST NON AFRICAN AMERICAN: 41 mL/min/{1.73_m2} — AB (ref >59)
Glucose: 151 mg/dL — ABNORMAL HIGH (ref 65–99)
Potassium: 4.2 mmol/L (ref 3.5–5.2)
SODIUM: 141 mmol/L (ref 134–144)

## 2015-10-25 ENCOUNTER — Telehealth: Payer: Self-pay

## 2015-10-25 NOTE — Telephone Encounter (Signed)
Pt son, Juanda Crumble called regarding pt lab results. Please call.

## 2015-10-25 NOTE — Telephone Encounter (Signed)
Please see result note 

## 2015-10-29 ENCOUNTER — Encounter: Payer: Self-pay | Admitting: Sports Medicine

## 2015-10-29 ENCOUNTER — Ambulatory Visit (INDEPENDENT_AMBULATORY_CARE_PROVIDER_SITE_OTHER): Payer: Medicare Other | Admitting: Sports Medicine

## 2015-10-29 DIAGNOSIS — M79673 Pain in unspecified foot: Secondary | ICD-10-CM | POA: Diagnosis not present

## 2015-10-29 DIAGNOSIS — E119 Type 2 diabetes mellitus without complications: Secondary | ICD-10-CM | POA: Diagnosis not present

## 2015-10-29 DIAGNOSIS — B351 Tinea unguium: Secondary | ICD-10-CM

## 2015-10-29 NOTE — Progress Notes (Signed)
Patient ID: Sharon Frazier, female   DOB: 07-Nov-1921, 82 y.o.   MRN: [id number]  Subjective: Sharon Frazier is a 82 y.o. female patient with history of type 2 diabetes who presents to office today complaining of long, painful nails  while ambulating in shoes; unable to trim. Patient states that the glucose reading this morning was not recorded. Patient denies any new changes in medication or new problems. Patient denies any new cramping, numbness, burning or tingling in the legs.  Patient Active Problem List   Diagnosis Date Noted  . Chronic diastolic CHF (congestive heart failure) ([place]) 06/19/2014  . A-fib ([place]) 12/25/2013  . Diabetes mellitus type 2, controlled ([place]) 12/25/2013  . Anemia 12/25/2013  . GI bleed 12/25/2013  . Hyperlipidemia 12/25/2013   Current Outpatient Prescriptions on File Prior to Visit  Medication Sig Dispense Refill  . aspirin 81 MG tablet Take 81 mg by mouth daily.    Marland Kitchen diltiazem (CARDIZEM) 120 MG tablet Take 1 tablet (120 mg total) by mouth daily. 30 tablet 11  . dorzolamide (TRUSOPT) 2 % ophthalmic solution 1 drop 2 (two) times daily.    . furosemide (LASIX) 20 MG tablet Take 1 tablet (20 mg total) by mouth daily as needed. 30 tablet 6  . glipiZIDE (GLUCOTROL XL) 2.5 MG 24 hr tablet Take 2.5 mg by mouth daily with breakfast.    . iron polysaccharides (NIFEREX) 150 MG capsule Take 150 mg by mouth 2 (two) times daily.    Marland Kitchen latanoprost (XALATAN) 0.005 % ophthalmic solution Place 1 drop into both eyes at bedtime.    Marland Kitchen lisinopril-hydrochlorothiazide (PRINZIDE,ZESTORETIC) 10-12.5 MG per tablet Take 1 tablet by mouth daily.    . metoprolol tartrate (LOPRESSOR) 25 MG tablet TAKE 1 TABLET BY MOUTH TWICE A DAY 60 tablet 3  . omeprazole (PRILOSEC) 20 MG capsule Take 20 mg by mouth 2 (two) times daily before a meal.     . potassium chloride SA (K-DUR,KLOR-CON) 20 MEQ tablet Take 20 mEq by mouth daily.     No current facility-administered medications on file prior to  visit.   Allergies  Allergen Reactions  . Alphagan [Brimonidine]     Arm numbness      Objective: General: Patient is awake, alert, and oriented x 3 and in no acute distress.  Integument: Skin is warm, dry and supple bilateral. Nails are tender, long, thickened and  dystrophic with subungual debris, consistent with onychomycosis, 1-5 bilateral. No signs of infection. No open lesions or preulcerative lesions present bilateral. Remaining integument unremarkable.  Vasculature:  Dorsalis Pedis pulse 1/4 bilateral. Posterior Tibial pulse  1/4 bilateral.  Capillary fill time <3 sec 1-5 bilateral. Scant hair growth to the level of the digits. Temperature gradient within normal limits. Mild varicosities present bilateral. No edema present bilateral.   Neurology: The patient has intact sensation measured with a 5.07/10g Semmes Weinstein Monofilament at all pedal sites bilateral . Vibratory sensation diminished bilateral with tuning fork. No Babinski sign present bilateral.   Musculoskeletal: Mild asymptomatic bunion R>L pedal deformities noted bilateral. Muscular strength 5/5 in all lower extremity muscular groups bilateral without pain or limitation on range of motion . No tenderness with calf compression bilateral.  Assessment and Plan: Problem List Items Addressed This Visit    None    Visit Diagnoses    Dermatophytosis of nail    -  Primary    Foot pain, unspecified laterality        Diabetes mellitus without complication ([place])          -  Examined patient. -Discussed and educated patient on diabetic foot care, especially with  regards to the vascular, neurological and musculoskeletal systems.  -Stressed the importance of good glycemic control and the detriment of not  controlling glucose levels in relation to the foot. -Mechanically debrided all nails 1-5 bilateral using sterile nail nipper and filed with dremel without incident  -Answered all patient questions -Patient to return in  3 months for at risk foot care -Patient advised to call the office if any problems or questions arise in the meantime.  Sharon Frazier, DPM

## 2015-11-08 ENCOUNTER — Encounter: Payer: Self-pay | Admitting: Primary Care

## 2015-11-08 ENCOUNTER — Ambulatory Visit (INDEPENDENT_AMBULATORY_CARE_PROVIDER_SITE_OTHER): Payer: Medicare Other | Admitting: Primary Care

## 2015-11-08 VITALS — BP 118/62 | HR 65 | Temp 97.9°F | Ht 60.0 in | Wt 95.8 lb

## 2015-11-08 DIAGNOSIS — E785 Hyperlipidemia, unspecified: Secondary | ICD-10-CM | POA: Diagnosis not present

## 2015-11-08 DIAGNOSIS — E119 Type 2 diabetes mellitus without complications: Secondary | ICD-10-CM

## 2015-11-08 DIAGNOSIS — I5032 Chronic diastolic (congestive) heart failure: Secondary | ICD-10-CM | POA: Diagnosis not present

## 2015-11-08 NOTE — Progress Notes (Signed)
Subjective:    Patient ID: Sharon Frazier, female    DOB: 02/20/1922, 82 y.o.   MRN: [id number]  HPI  Sharon Frazier is a 82 year old female who presents today to establish care and discuss the problems mentioned below. Will obtain old records.  1) CHF: Currently managed on furosemide 40 mg that she takes only on Monday. She is currently following with Dr. Rockey Situ every 6 months and the heart failure clinic. Denies SOB, lower extremity edema, chest pain.  2) Atrial Fibrillation: Currently managed on diltiazem 120 mg and aspirin 81 mg daily.   3) Essential Hypertension: Currently managed on diltiazem 120 mg, lisinopril-HCTC 10/12.5 mg, and lopressor 25 mg BID. Denies chest pain, headaches, shortness of breath, dizziness.   4) Type 2 Diabetes: Diagnosed several years ago. Currently managed on glipizide 2.5 XL mg. Her last A1C was 5.7 in December 2016. She doesn't check her blood sugars. Currently managed on ACE, pneumonia vaccination completed in 2013.  5) Glaucoma: Diagnosed several years ago. Present to both eyes. Next appointment with opthomologist in February 20th. Currently managed on dorzolamide 2% and latanoprost 0.005% drops.   Review of Systems  Constitutional: Negative for unexpected weight change.       Gradual decrease in appetite.   HENT: Negative for rhinorrhea.   Respiratory: Negative for cough and shortness of breath.        Sleeps with oxygen at night. 2 liters.  Cardiovascular: Negative for chest pain.  Gastrointestinal: Positive for constipation. Negative for diarrhea.       Bowel movements are every 2-3 days.   Genitourinary: Negative for difficulty urinating.  Musculoskeletal: Negative for myalgias.       Intermittent back pain.  Skin: Negative for rash.  Neurological: Negative for dizziness, numbness and headaches.  Psychiatric/Behavioral:       Denies concerns for anxiety or depression       Past Medical History  Diagnosis Date  . H/O: GI bleed   .  GERD (gastroesophageal reflux disease)   . Diverticulosis   . HTN (hypertension)   . Hyperlipidemia   . Diabetes mellitus without complication ([place])   . PUD (peptic ulcer disease)   . Palpitations   . A-fib ([place])   . Glaucoma   . PVD (peripheral vascular disease) ([place])   . CHF (congestive heart failure) ([place])   . Breast cancer ([place])     right    Social History   Social History  . Marital Status: Widowed    Spouse Name: N/A  . Number of Children: N/A  . Years of Education: N/A   Occupational History  . Not on file.   Social History Main Topics  . Smoking status: Former Smoker -- 2.00 packs/day for 25 years    Types: Cigarettes    Quit date: 11/07/1987  . Smokeless tobacco: Never Used  . Alcohol Use: No  . Drug Use: No  . Sexual Activity: Not on file   Other Topics Concern  . Not on file   Social History Narrative   Widow   Retired. Once worked as a Materials engineer.   Enjoys cross word puzzles.     Past Surgical History  Procedure Laterality Date  . Mastectomy      right breast   . Hip surgery      left hip  . Renal artery stent    . Vaginal hysterectomy      Family History  Problem Relation Age of Onset  . Cancer  Sister     Colon cancer    Allergies  Allergen Reactions  . Alphagan [Brimonidine]     Arm numbness      Current Outpatient Prescriptions on File Prior to Visit  Medication Sig Dispense Refill  . aspirin 81 MG tablet Take 81 mg by mouth daily.    Marland Kitchen diltiazem (CARDIZEM) 120 MG tablet Take 1 tablet (120 mg total) by mouth daily. 30 tablet 11  . dorzolamide (TRUSOPT) 2 % ophthalmic solution 1 drop 2 (two) times daily.    Marland Kitchen glipiZIDE (GLUCOTROL XL) 2.5 MG 24 hr tablet Take 2.5 mg by mouth daily with breakfast.    . iron polysaccharides (NIFEREX) 150 MG capsule Take 150 mg by mouth 2 (two) times daily.    Marland Kitchen latanoprost (XALATAN) 0.005 % ophthalmic solution Place 1 drop into both eyes at bedtime.    Marland Kitchen lisinopril-hydrochlorothiazide  (PRINZIDE,ZESTORETIC) 10-12.5 MG per tablet Take 1 tablet by mouth daily.    . metoprolol tartrate (LOPRESSOR) 25 MG tablet TAKE 1 TABLET BY MOUTH TWICE A DAY 60 tablet 3  . omeprazole (PRILOSEC) 20 MG capsule Take 20 mg by mouth 2 (two) times daily before a meal.     . potassium chloride SA (K-DUR,KLOR-CON) 20 MEQ tablet Take 20 mEq by mouth daily. Taking 10 mg on Mondays, Wednesdays, and Fridays.     No current facility-administered medications on file prior to visit.    BP 118/62 mmHg  Pulse 65  Temp(Src) 97.9 F (36.6 C) (Oral)  Ht 5' (1.524 m)  Wt 95 lb 12.8 oz (43.455 kg)  BMI 18.71 kg/m2  SpO2 95%  LMP  (LMP Unknown)    Objective:   Physical Exam  Constitutional: She is oriented to person, place, and time. She appears well-nourished.  Neck: Neck supple.  Cardiovascular: Normal rate.  An irregularly irregular rhythm present.  Pulmonary/Chest: Effort normal and breath sounds normal. She has no rales.  Neurological: She is alert and oriented to person, place, and time.  Skin: Skin is warm and dry.  Psychiatric: She has a normal mood and affect.          Assessment & Plan:

## 2015-11-08 NOTE — Progress Notes (Signed)
Pre visit review using our clinic review tool, if applicable. No additional management support is needed unless otherwise documented below in the visit note. 

## 2015-11-08 NOTE — Patient Instructions (Signed)
Please schedule a physical with me in 6 months. We will do labs that same day.   It was a pleasure to meet you today! Please don't hesitate to call me with any questions. Welcome to [facility]!

## 2015-11-08 NOTE — Assessment & Plan Note (Signed)
Managed on glipizide XL 2.5 mg. A1C of 5.7 in December 2016. Denies dizziness and recent falls. Continue same. Repeat in 6 months.

## 2015-11-08 NOTE — Assessment & Plan Note (Addendum)
Stable. Currently managed on lasix 40 mg on Monday's and follows with CHF clinic.  No recent exacerbations per patient. BMP with GFR of 41 in January.

## 2015-11-08 NOTE — Assessment & Plan Note (Signed)
Denies prior history although noted in chart, currently not managed on statin. Will obtain lipid panel this summer during physical.

## 2015-11-10 ENCOUNTER — Encounter: Payer: Self-pay | Admitting: Primary Care

## 2015-11-10 ENCOUNTER — Telehealth: Payer: Self-pay | Admitting: Primary Care

## 2015-11-10 NOTE — Telephone Encounter (Signed)
Please notify Sharon Frazier that i've reviewed her records and they are ready for pick up at her convenience.

## 2015-11-11 NOTE — Telephone Encounter (Signed)
Called and notified patient's son that Anda Kraft had reviewed the records and are ready to be pick up.

## 2015-11-15 LAB — HM DIABETES EYE EXAM

## 2015-11-24 ENCOUNTER — Other Ambulatory Visit: Payer: Self-pay

## 2015-11-24 DIAGNOSIS — D509 Iron deficiency anemia, unspecified: Secondary | ICD-10-CM

## 2015-11-24 MED ORDER — POLYSACCHARIDE IRON COMPLEX 150 MG PO CAPS: 150.0000 mg | ORAL_CAPSULE | Freq: Two times a day (BID) | ORAL | Status: DC

## 2015-11-24 NOTE — Telephone Encounter (Signed)
[name] left v/m requesting refill ferrex to [facility]; Dr Hardin Negus had previously filled; pt established care with Sharon Fitz NP on 11/08/15 but she has never filled med before. Please advise.

## 2015-11-24 NOTE — Telephone Encounter (Signed)
Called and notified patient's son that Rx has been sent to [facility].

## 2016-01-03 ENCOUNTER — Telehealth: Payer: Self-pay | Admitting: Cardiovascular Disease

## 2016-01-03 ENCOUNTER — Other Ambulatory Visit: Payer: Self-pay

## 2016-01-03 MED ORDER — DILTIAZEM HCL ER 120 MG PO CP12: 120.0000 mg | ORAL_CAPSULE | Freq: Two times a day (BID) | ORAL | Status: DC

## 2016-01-03 MED ORDER — DILTIAZEM HCL 120 MG PO TABS: 120.0000 mg | ORAL_TABLET | Freq: Every day | ORAL | Status: DC

## 2016-01-03 NOTE — Addendum Note (Signed)
Addended by: Dede Query R on: 01/03/2016 03:54 PM   Modules accepted: Orders, Medications

## 2016-01-03 NOTE — Telephone Encounter (Signed)
Per Dr. Rockey Situ, send in diltiazem ER 120 mg capsules. Spoke w/ Amy and advised of Dr. Donivan Scull recommendation.  Asked her to call back w/ any questions or concerns.

## 2016-01-03 NOTE — Telephone Encounter (Signed)
°*  STAT* If patient is at the pharmacy, call can be transferred to refill team.   1. Which medications need to be refilled? (please list name of each medication and dose if known) diltiazem   2. Which pharmacy/location (including street and city if local pharmacy) is medication to be sent to? [facility]  3. Do they need a 30 day or 90 day supply? 90  Pt has been out a few days please send asap

## 2016-01-03 NOTE — Telephone Encounter (Signed)
Pharmacy says patient has been on 24 hr er capsule refill sent for tablet. Is this  A change or sent in error ? Please call amy to discuss.

## 2016-01-03 NOTE — Telephone Encounter (Signed)
Refill sent for Diltiazem

## 2016-01-16 ENCOUNTER — Other Ambulatory Visit: Payer: Self-pay | Admitting: Cardiovascular Disease

## 2016-01-19 ENCOUNTER — Other Ambulatory Visit: Payer: Self-pay | Admitting: Primary Care

## 2016-01-19 DIAGNOSIS — K219 Gastro-esophageal reflux disease without esophagitis: Secondary | ICD-10-CM

## 2016-01-19 MED ORDER — OMEPRAZOLE 20 MG PO CPDR: 20.0000 mg | DELAYED_RELEASE_CAPSULE | Freq: Two times a day (BID) | ORAL | Status: DC

## 2016-01-19 NOTE — Telephone Encounter (Signed)
Received refill request for omeprazole, rx has not fbeen filled by Sharon Frazier.  Pt was last seen on 11/08/15 next appt scheduled for 05/09/16.

## 2016-01-27 ENCOUNTER — Ambulatory Visit (INDEPENDENT_AMBULATORY_CARE_PROVIDER_SITE_OTHER): Payer: Medicare Other | Admitting: Podiatry

## 2016-01-27 ENCOUNTER — Encounter: Payer: Self-pay | Admitting: Podiatry

## 2016-01-27 DIAGNOSIS — B351 Tinea unguium: Secondary | ICD-10-CM | POA: Diagnosis not present

## 2016-01-27 DIAGNOSIS — E119 Type 2 diabetes mellitus without complications: Secondary | ICD-10-CM

## 2016-01-27 DIAGNOSIS — M79673 Pain in unspecified foot: Secondary | ICD-10-CM | POA: Diagnosis not present

## 2016-01-27 NOTE — Progress Notes (Signed)
Patient ID: Girtha Rm, female   DOB: 1922-08-07, 82 y.o.   MRN: [id number]  Subjective: 82 y.o. returns the office today for painful, elongated, thickened toenails which he cannot trim himself. Denies any redness or drainage around the nails. Denies any acute changes since last appointment and no new complaints today. Denies any systemic complaints such as fevers, chills, nausea, vomiting.   She presented today on the wrong day, however I was more than happy to see her today so she didn't have to come back tomorrow.   Objective: AAO 3, NAD DP/PT pulses palpable 1/4, CRT less than 3 seconds Nails hypertrophic, dystrophic, elongated, brittle, discolored 10. There is tenderness overlying the nails 1-5 bilaterally. There is no surrounding erythema or drainage along the nail sites. No open lesions or pre-ulcerative lesions are identified. No other areas of tenderness bilateral lower extremities. No overlying edema, erythema, increased warmth. No pain with calf compression, swelling, warmth, erythema.  Assessment: Patient presents with symptomatic onychomycosis  Plan: -Treatment options including alternatives, risks, complications were discussed -Nails sharply debrided 10 without complication/bleeding. -Discussed daily foot inspection. If there are any changes, to call the office immediately.  -Follow-up in 3 months or sooner if any problems are to arise. In the meantime, encouraged to call the office with any questions, concerns, changes symptoms.  Celesta Gentile, DPM

## 2016-01-28 ENCOUNTER — Ambulatory Visit: Payer: Medicare Other | Admitting: Sports Medicine

## 2016-02-01 ENCOUNTER — Ambulatory Visit: Payer: Medicare Other | Admitting: Podiatry

## 2016-02-28 ENCOUNTER — Encounter: Payer: Self-pay | Admitting: Family

## 2016-02-28 ENCOUNTER — Ambulatory Visit: Payer: Medicare Other | Attending: Family | Admitting: Family

## 2016-02-28 VITALS — BP 119/30 | HR 106 | Resp 18 | Ht 60.0 in | Wt 87.0 lb

## 2016-02-28 DIAGNOSIS — Z853 Personal history of malignant neoplasm of breast: Secondary | ICD-10-CM | POA: Diagnosis not present

## 2016-02-28 DIAGNOSIS — Z87891 Personal history of nicotine dependence: Secondary | ICD-10-CM | POA: Diagnosis not present

## 2016-02-28 DIAGNOSIS — R5383 Other fatigue: Secondary | ICD-10-CM | POA: Diagnosis not present

## 2016-02-28 DIAGNOSIS — H409 Unspecified glaucoma: Secondary | ICD-10-CM | POA: Insufficient documentation

## 2016-02-28 DIAGNOSIS — I482 Chronic atrial fibrillation, unspecified: Secondary | ICD-10-CM

## 2016-02-28 DIAGNOSIS — I4891 Unspecified atrial fibrillation: Secondary | ICD-10-CM | POA: Insufficient documentation

## 2016-02-28 DIAGNOSIS — Z9011 Acquired absence of right breast and nipple: Secondary | ICD-10-CM | POA: Diagnosis not present

## 2016-02-28 DIAGNOSIS — R000 Tachycardia, unspecified: Secondary | ICD-10-CM | POA: Diagnosis not present

## 2016-02-28 DIAGNOSIS — I739 Peripheral vascular disease, unspecified: Secondary | ICD-10-CM | POA: Diagnosis not present

## 2016-02-28 DIAGNOSIS — I110 Hypertensive heart disease with heart failure: Secondary | ICD-10-CM | POA: Diagnosis not present

## 2016-02-28 DIAGNOSIS — Z8711 Personal history of peptic ulcer disease: Secondary | ICD-10-CM | POA: Insufficient documentation

## 2016-02-28 DIAGNOSIS — K219 Gastro-esophageal reflux disease without esophagitis: Secondary | ICD-10-CM | POA: Diagnosis not present

## 2016-02-28 DIAGNOSIS — E785 Hyperlipidemia, unspecified: Secondary | ICD-10-CM | POA: Insufficient documentation

## 2016-02-28 DIAGNOSIS — Z7982 Long term (current) use of aspirin: Secondary | ICD-10-CM | POA: Diagnosis not present

## 2016-02-28 DIAGNOSIS — I5032 Chronic diastolic (congestive) heart failure: Secondary | ICD-10-CM

## 2016-02-28 DIAGNOSIS — E119 Type 2 diabetes mellitus without complications: Secondary | ICD-10-CM | POA: Diagnosis not present

## 2016-02-28 DIAGNOSIS — R0602 Shortness of breath: Secondary | ICD-10-CM | POA: Insufficient documentation

## 2016-02-28 DIAGNOSIS — Z888 Allergy status to other drugs, medicaments and biological substances status: Secondary | ICD-10-CM | POA: Diagnosis not present

## 2016-02-28 DIAGNOSIS — Z90710 Acquired absence of both cervix and uterus: Secondary | ICD-10-CM | POA: Insufficient documentation

## 2016-02-28 NOTE — Progress Notes (Signed)
Subjective:    Patient ID: Girtha Rm, female    DOB: [birth year], 82 y.o.   MRN: [id number]  Congestive Heart Failure Presents for follow-up visit. The disease course has been stable. Associated symptoms include fatigue and shortness of breath. Pertinent negatives include no abdominal pain, chest pain, chest pressure, edema, orthopnea or palpitations. The symptoms have been stable. Past treatments include beta blockers, salt and fluid restriction, oxygen and ACE inhibitors. The treatment provided moderate relief. Compliance with prior treatments has been good. Her past medical history is significant for arrhythmia, DM and HTN. There is no history of chronic lung disease.  Atrial Fibrillation Presents for follow-up visit. Symptoms include shortness of breath, tachycardia and weakness (in legs). Symptoms are negative for chest pain, dizziness, hypertension and palpitations. The symptoms have been stable. Past treatments include beta blockers, Ca channel blockers and aspirin. Compliance with prior treatments has been good. Past medical history includes atrial fibrillation, CHF and HTN. There are no medication compliance problems.   Past Medical History  Diagnosis Date  . H/O: GI bleed   . GERD (gastroesophageal reflux disease)   . Diverticulosis   . HTN (hypertension)   . Hyperlipidemia   . Diabetes mellitus without complication ([place])   . PUD (peptic ulcer disease)   . Palpitations   . A-fib ([place])   . Glaucoma   . PVD (peripheral vascular disease) ([place])   . CHF (congestive heart failure) ([place])   . Breast cancer ([place])     right  . History of colonic polyps     Past Surgical History  Procedure Laterality Date  . Mastectomy      right breast   . Hip surgery      left hip  . Renal artery stent    . Vaginal hysterectomy      Family History  Problem Relation Age of Onset  . Cancer Sister     Colon cancer    Social History  Substance Use Topics  . Smoking status: Former  Smoker -- 2.00 packs/day for 25 years    Types: Cigarettes    Quit date: 11/07/1987  . Smokeless tobacco: Never Used  . Alcohol Use: No    Allergies  Allergen Reactions  . Alphagan [Brimonidine]     Arm numbness      Prior to Admission medications   Medication Sig Start Date End Date Taking? Authorizing Provider  aspirin 81 MG tablet Take 81 mg by mouth daily.   Yes Historical Provider, MD  diltiazem (CARDIZEM SR) 120 MG 12 hr capsule Take 1 capsule (120 mg total) by mouth 2 (two) times daily. Patient taking differently: Take 120 mg by mouth daily.  01/03/16  Yes Minna Merritts, MD  dorzolamide (TRUSOPT) 2 % ophthalmic solution 1 drop 2 (two) times daily.   Yes Historical Provider, MD  furosemide (LASIX) 40 MG tablet Take 40 mg by mouth daily.   Yes Historical Provider, MD  glipiZIDE (GLUCOTROL XL) 2.5 MG 24 hr tablet Take 2.5 mg by mouth daily with breakfast.   Yes Historical Provider, MD  iron polysaccharides (NIFEREX) 150 MG capsule Take 1 capsule (150 mg total) by mouth 2 (two) times daily. 11/24/15  Yes Pleas Koch, NP  latanoprost (XALATAN) 0.005 % ophthalmic solution Place 1 drop into both eyes at bedtime.   Yes Historical Provider, MD  lisinopril-hydrochlorothiazide (PRINZIDE,ZESTORETIC) 10-12.5 MG per tablet Take 1 tablet by mouth daily.   Yes Historical Provider, MD  metoprolol tartrate (LOPRESSOR) 25 MG  tablet TAKE 1 TABLET BY MOUTH TWICE A DAY 01/17/16  Yes Minna Merritts, MD  omeprazole (PRILOSEC) 20 MG capsule Take 1 capsule (20 mg total) by mouth 2 (two) times daily before a meal. 01/19/16  Yes Pleas Koch, NP  potassium chloride SA (K-DUR,KLOR-CON) 20 MEQ tablet Take 20 mEq by mouth daily. Taking 10 mg on Mondays, Wednesdays, and Fridays.   Yes Historical Provider, MD      Review of Systems  Constitutional: Positive for fatigue. Negative for appetite change.  HENT: Positive for rhinorrhea. Negative for congestion and sore throat.   Eyes: Positive for  discharge. Negative for itching.  Respiratory: Positive for shortness of breath. Negative for cough, chest tightness and wheezing.   Cardiovascular: Negative for chest pain, palpitations and leg swelling.  Gastrointestinal: Negative for abdominal pain and abdominal distention.  Endocrine: Negative.   Genitourinary: Negative.   Musculoskeletal: Negative for back pain and neck pain.  Skin: Negative.   Allergic/Immunologic: Negative.   Neurological: Positive for weakness (in legs). Negative for dizziness, light-headedness and headaches.  Hematological: Negative for adenopathy. Bruises/bleeds easily.  Psychiatric/Behavioral: Positive for sleep disturbance (sometimes sleeps good. wearing oxygen at 2L at bedtime). Negative for dysphoric mood. The patient is not nervous/anxious.        Objective:   Physical Exam  Constitutional: She is oriented to person, place, and time. She appears well-developed and well-nourished.  HENT:  Head: Normocephalic and atraumatic.  Eyes: Conjunctivae are normal. Pupils are equal, round, and reactive to light.  Neck: Normal range of motion. Neck supple.  Cardiovascular: An irregular rhythm present. Tachycardia present.   Pulmonary/Chest: Effort normal. She has no wheezes. She has no rales.  Abdominal: Soft. She exhibits no distension. There is no tenderness.  Musculoskeletal: She exhibits no edema or tenderness.  Neurological: She is alert and oriented to person, place, and time.  Skin: Skin is warm and dry.  Psychiatric: She has a normal mood and affect. Her behavior is normal. Thought content normal.  Nursing note and vitals reviewed.   BP 119/30 mmHg  Pulse 106  Resp 18  Ht 5' (1.524 m)  Wt 87 lb (39.463 kg)  BMI 16.99 kg/m2  SpO2 100%  LMP  (LMP Unknown)       Assessment & Plan:  1: Chronic heart failure with preserved ejection fraction- Patient presents with fatigue and shortness of breath upon exertion. She came into the office in a wheelchair  because she doesn't think she would have been able to walk that far. When she sits down for a few minutes, her symptoms improve. Denies any symptoms at rest. She continues to weigh herself daily and says that her weight has been stable. By our scale, she's lost 6 pounds since she was last here December 2016. She says that she's eating well but says that she doesn't eat a whole lot. Reminded to call for an overnight weight gain of >2 pounds or a weekly weight gain of >5 pounds. She is not adding any salt to her food. Her son says that she had her furosemide decreased but then she started getting some swelling in her legs again so he started giving it to her daily. Discussed that her renal function had worsened which is why cardiology decreased it and since she currently doesn't have any swelling nor fluid in her lungs, that he should decrease it back to every other day. Discussed that we wanted her to have enough diuretic but not too much. If her weight  goes up or the swelling returns, he is to call.  2: Atrial fibrillation- She is tachycardic today but had been bradycardic in the past. Continues to take her metoprolol and diltiazem daily. Follows closely with cardiology about this. 3: Diabetes- She continues to take glipizide but doesn't check her glucose levels at home. She says that her PCP monitors this for her.  Medication bottles were reviewed.  Return here in 3 months or sooner for any questions/problems before then.

## 2016-02-28 NOTE — Patient Instructions (Addendum)
Continue weighing daily and call for an overnight weight gain of > 2 pounds or a weekly weight gain of >5 pounds.  Since your legs are no longer swollen, decrease fluid pill to every other day.

## 2016-03-08 ENCOUNTER — Encounter: Payer: Self-pay | Admitting: Cardiovascular Disease

## 2016-03-08 ENCOUNTER — Ambulatory Visit (INDEPENDENT_AMBULATORY_CARE_PROVIDER_SITE_OTHER): Payer: Medicare Other | Admitting: Cardiovascular Disease

## 2016-03-08 VITALS — BP 104/62 | HR 56 | Ht 60.0 in | Wt 86.0 lb

## 2016-03-08 DIAGNOSIS — E785 Hyperlipidemia, unspecified: Secondary | ICD-10-CM | POA: Diagnosis not present

## 2016-03-08 DIAGNOSIS — I5032 Chronic diastolic (congestive) heart failure: Secondary | ICD-10-CM | POA: Diagnosis not present

## 2016-03-08 DIAGNOSIS — E119 Type 2 diabetes mellitus without complications: Secondary | ICD-10-CM | POA: Diagnosis not present

## 2016-03-08 DIAGNOSIS — I482 Chronic atrial fibrillation, unspecified: Secondary | ICD-10-CM

## 2016-03-08 DIAGNOSIS — I10 Essential (primary) hypertension: Secondary | ICD-10-CM

## 2016-03-08 NOTE — Progress Notes (Signed)
Patient ID: Sharon Frazier, female   DOB: 15-Jan-1922, 82 y.o.   MRN: [id number] Cardiology Office Note  Date:  03/08/2016   ID:  Sharon Frazier, DOB 1922/08/22, MRN [id number]  PCP:  Sharon Flow, NP   Chief Complaint  Patient presents with  . other    6 month f/u. Meds reviewed verbally with pt.    HPI:  Sharon Frazier is a pleasant 82 year old woman with history of GI bleed, iron deficiency anemia, laser treatment of gastric angiodysplasias , peptic ulcer disease, GERD, diverticulosis and internal hemorrhoids, hypertension, hyperlipidemia, type 2 diabetes, previous history of tobacco abuse, right breast cancer status post mastectomy and chemotherapy with hospital admission 12/11/2013 and discharged 12/14/2013 for new onset atrial fibrillation. she developed bronchitis with weakness and cough with sputum. In the emergency room she was found to have atrial fibrillation with RVR. She was started on rate control medications and she was felt to be a poor candidate for anticoagulation. Unclear exactly how long she had been in atrial fibrillation. She presents today for routine follow-up of her atrial fibrillation  in follow-up today, she has been taking Lasix daily, Sometimes twice a day Stable renal function January 2017 Drinks significant fluids daily, 5 bottles of water, does not feel dehydrated Dramatic weight drop since her last clinic visit, 93 pounds now down to 86 pounds Does not feel hungry   Husband does all of her medications, puts them in a pillbox  Most recent hemoglobin A1c December 2016 was 5.7  Denies any tachycardia, chest discomfort walks with a cane, no recent falls  EKG on today's visit shows atrial fibrillation with ventricular rate 56 bpm  Other past medical history in the hospital at the end of July 2015. She presented with shortness of breath and leg swelling. She had pleural effusion. She was started on Lasix with aggressive diuresis. Discharged  on Lasix 40 mg twice a day. She was not on Lasix prior to her admission She did have thoracentesis on the right during her hospital stay. Discharge on 04/24/2014. She did have one unit packed red blood cells Prior echocardiogram with ejection fraction 65%.  In the hospital CT scan of the chest showing possible bronchopneumonia, moderate bilateral pleural effusions, right greater than left, atherosclerosis of the coronary arteries, emphysema, cirrhosis, abdominal ascites  seen in the heart failure clinic 05/25/2014. No medication changes made at that time.  Echocardiogram showed ejection fraction 60-65%, mildly elevated right ventricular systolic pressures Notes indicate she had been seen by Sharon Frazier recently with EGD and electrocautery. She still had trace blood in her stool on followup visits with him. She was started on diltiazem 120 mg daily, amlodipine was held, she was started on metoprolol    PMH:   has a past medical history of H/O: GI bleed; GERD (gastroesophageal reflux disease); Diverticulosis; HTN (hypertension); Hyperlipidemia; Diabetes mellitus without complication ([place]); PUD (peptic ulcer disease); Palpitations; A-fib ([place]); Glaucoma; PVD (peripheral vascular disease) ([place]); CHF (congestive heart failure) ([place]); Breast cancer ([place]); and History of colonic polyps.  PSH:    Past Surgical History  Procedure Laterality Date  . Mastectomy      right breast   . Hip surgery      left hip  . Renal artery stent    . Vaginal hysterectomy      Current Outpatient Prescriptions  Medication Sig Dispense Refill  . aspirin 81 MG tablet Take 81 mg by mouth daily.    Marland Kitchen diltiazem (CARDIZEM SR) 120 MG 12 hr  capsule Take 1 capsule (120 mg total) by mouth 2 (two) times daily. (Patient taking differently: Take 120 mg by mouth daily. ) 60 capsule 6  . dorzolamide (TRUSOPT) 2 % ophthalmic solution 1 drop 2 (two) times daily.    . furosemide (LASIX) 40 MG tablet Take 40 mg by mouth 2 (two)  times daily as needed.     Marland Kitchen glipiZIDE (GLUCOTROL XL) 2.5 MG 24 hr tablet Take 2.5 mg by mouth daily with breakfast.    . iron polysaccharides (NIFEREX) 150 MG capsule Take 1 capsule (150 mg total) by mouth 2 (two) times daily. 60 capsule 5  . latanoprost (XALATAN) 0.005 % ophthalmic solution Place 1 drop into both eyes at bedtime.    Marland Kitchen lisinopril-hydrochlorothiazide (PRINZIDE,ZESTORETIC) 10-12.5 MG per tablet Take 1 tablet by mouth daily.    . metoprolol tartrate (LOPRESSOR) 25 MG tablet TAKE 1 TABLET BY MOUTH TWICE A DAY 60 tablet 3  . omeprazole (PRILOSEC) 20 MG capsule Take 1 capsule (20 mg total) by mouth 2 (two) times daily before a meal. 180 capsule 0  . potassium chloride SA (K-DUR,KLOR-CON) 20 MEQ tablet Take 20 mEq by mouth daily. Taking 10 mg on Mondays, Wednesdays, and Fridays.     No current facility-administered medications for this visit.     Allergies:   Alphagan   Social History:  The patient  reports that she quit smoking about 28 years ago. Her smoking use included Cigarettes. She has a 50 pack-year smoking history. She has never used smokeless tobacco. She reports that she does not drink alcohol or use illicit drugs.   Family History:   family history includes Cancer in her sister.    Review of Systems: Review of Systems  Constitutional: Positive for weight loss.  Respiratory: Negative.   Cardiovascular: Negative.   Gastrointestinal: Negative.   Musculoskeletal:       Walks with a cane  Neurological: Positive for weakness.  Psychiatric/Behavioral: Negative.   All other systems reviewed and are negative.    PHYSICAL EXAM: VS:  BP 104/62 mmHg  Pulse 56  Ht 5' (1.524 m)  Wt 86 lb (39.009 kg)  BMI 16.80 kg/m2  LMP  (LMP Unknown) , BMI Body mass index is 16.8 kg/(m^2). GEN: Thin woman, in no acute distress HEENT: normal Neck: no JVD, carotid bruits, or masses Cardiac: RRR; no murmurs, rubs, or gallops,no edema  Respiratory:  clear to auscultation  bilaterally, normal work of breathing GI: soft, nontender, nondistended, + BS Sharon: no deformity or atrophy Skin: warm and dry, no rash Neuro:  Strength and sensation are intact Psych: euthymic mood, full affect    Recent Labs: 10/11/2015: BUN 31; Creatinine, Ser 1.17*; Potassium 4.2; Sodium 141    Lipid Panel Lab Results  Component Value Date   CHOL 101 02/06/2014   HDL 38* 02/06/2014   LDLCALC 48 02/06/2014   TRIG 75 02/06/2014      Wt Readings from Last 3 Encounters:  03/08/16 86 lb (39.009 kg)  02/28/16 87 lb (39.463 kg)  11/08/15 95 lb 12.8 oz (43.455 kg)       ASSESSMENT AND PLAN:  Chronic atrial fibrillation (HCC) - Plan: EKG 12-Lead Chronic atrial fibrillation, bradycardia on today's visit with low blood pressure Recommended she decrease the metoprolol down to 12.5 mill grams daily  If heart rate continues to run low, will stop the metoprolol  Hyperlipidemia Low-cholesterol on her last lab draw prior to weight loss Currently a nonissue, not on a statin  Chronic diastolic  CHF (congestive heart failure) ([place]) Appears relatively euvolemic, cautioned her against overdiuresis Last blood work was in January 2017, normal renal function  Controlled type 2 diabetes mellitus without complication, without long-term current use of insulin (HCC) Dramatic weight loss of 7 pounds since her last clinic visit 6 months ago May not need her diabetes medication Encouraged her to eat more Just does not feel hungry  Hypertension Blood pressure is low today likely secondary to weight loss decrease lisinopril HCTZ down to one half pill If blood pressure continues to run low, will stop the lisinopril HCTZ   Total encounter time more than 25 minutes  Greater than 50% was spent in counseling and coordination of care with the patient   Disposition:   F/U  6 months   Orders Placed This Encounter  Procedures  . EKG 12-Lead     Signed, Esmond Plants, M.D., Ph.D. 03/08/2016   [place], [place]

## 2016-03-08 NOTE — Patient Instructions (Signed)
You are doing well.  Blood pressure is low Please cut the metoprolol in 1/2 twice a day Please cut the lisinopril/hctz in 1/2 once a day  Miralex for constipation  Please call us if you have new issues that need to be addressed before your next appt.  Your physician wants you to follow-up in: 6 months.  You will receive a reminder letter in the mail two months in advance. If you don't receive a letter, please call our office to schedule the follow-up appointment.

## 2016-03-20 ENCOUNTER — Other Ambulatory Visit: Payer: Self-pay | Admitting: Primary Care

## 2016-03-20 DIAGNOSIS — I10 Essential (primary) hypertension: Secondary | ICD-10-CM

## 2016-03-20 MED ORDER — LISINOPRIL-HYDROCHLOROTHIAZIDE 10-12.5 MG PO TABS: 1.0000 | ORAL_TABLET | Freq: Every day | ORAL | Status: DC

## 2016-03-20 NOTE — Telephone Encounter (Signed)
Received faxed refill request for lisinopril-hydrochlorothiazide (PRINZIDE,ZESTORETIC) 10-12.5 MG per tablet. Medication has not been prescribed by Sharon Frazier. Last seen on 11/08/2015. Follow up on 05/09/2016.

## 2016-03-21 ENCOUNTER — Other Ambulatory Visit: Payer: Self-pay | Admitting: Primary Care

## 2016-03-21 DIAGNOSIS — E119 Type 2 diabetes mellitus without complications: Secondary | ICD-10-CM

## 2016-03-21 MED ORDER — GLIPIZIDE ER 2.5 MG PO TB24: 2.5000 mg | ORAL_TABLET | Freq: Every day | ORAL | Status: DC

## 2016-03-21 NOTE — Telephone Encounter (Signed)
Received faxed refill from [facility] for   glipiZIDE (GLUCOTROL XL) 2.5 MG 24 hr tablet  Medication have not been prescribed by Anda Kraft. Last seen on 11/08/2015. Follow up on 05/09/2016.

## 2016-04-10 ENCOUNTER — Other Ambulatory Visit: Payer: Self-pay | Admitting: Primary Care

## 2016-04-28 ENCOUNTER — Ambulatory Visit (INDEPENDENT_AMBULATORY_CARE_PROVIDER_SITE_OTHER): Payer: Medicare Other | Admitting: Sports Medicine

## 2016-04-28 ENCOUNTER — Encounter: Payer: Self-pay | Admitting: Sports Medicine

## 2016-04-28 DIAGNOSIS — M79673 Pain in unspecified foot: Secondary | ICD-10-CM

## 2016-04-28 DIAGNOSIS — E119 Type 2 diabetes mellitus without complications: Secondary | ICD-10-CM | POA: Diagnosis not present

## 2016-04-28 DIAGNOSIS — B351 Tinea unguium: Secondary | ICD-10-CM | POA: Diagnosis not present

## 2016-04-28 NOTE — Progress Notes (Signed)
Patient ID: Sharon Frazier, female   DOB: 06/04/1922, 82 y.o.   MRN: [id number]  Subjective: Sharon Frazier is a 82 y.o. female patient with history of type 2 diabetes who presents to office today complaining of long, painful nails  while ambulating in shoes; unable to trim. Patient states that the glucose reading this morning was not recorded. Patient denies any new changes in medication or new problems. Patient denies any new cramping, numbness, burning or tingling in the legs.  Patient Active Problem List   Diagnosis Date Noted  . Essential hypertension 03/08/2016  . Chronic diastolic CHF (congestive heart failure) ([place]) 06/19/2014  . A-fib ([place]) 12/25/2013  . Diabetes mellitus type 2, controlled ([place]) 12/25/2013  . Anemia 12/25/2013  . GI bleed 12/25/2013  . Hyperlipidemia 12/25/2013   Current Outpatient Prescriptions on File Prior to Visit  Medication Sig Dispense Refill  . aspirin 81 MG tablet Take 81 mg by mouth daily.    Marland Kitchen diltiazem (CARDIZEM SR) 120 MG 12 hr capsule Take 1 capsule (120 mg total) by mouth 2 (two) times daily. (Patient taking differently: Take 120 mg by mouth daily. ) 60 capsule 6  . dorzolamide (TRUSOPT) 2 % ophthalmic solution 1 drop 2 (two) times daily.    . furosemide (LASIX) 40 MG tablet Take 40 mg by mouth 2 (two) times daily as needed.     Marland Kitchen glipiZIDE (GLUCOTROL XL) 2.5 MG 24 hr tablet Take 1 tablet (2.5 mg total) by mouth daily with breakfast. 90 tablet 2  . iron polysaccharides (NIFEREX) 150 MG capsule Take 1 capsule (150 mg total) by mouth 2 (two) times daily. 60 capsule 5  . latanoprost (XALATAN) 0.005 % ophthalmic solution Place 1 drop into both eyes at bedtime.    Marland Kitchen lisinopril-hydrochlorothiazide (PRINZIDE,ZESTORETIC) 10-12.5 MG tablet Take 1 tablet by mouth daily. 90 tablet 3  . metoprolol tartrate (LOPRESSOR) 25 MG tablet TAKE 1 TABLET BY MOUTH TWICE A DAY 60 tablet 3  . omeprazole (PRILOSEC) 20 MG capsule TAKE ONE CAPSULE BY MOUTH TWICE A DAY  BEFORE A MEAL 180 capsule 1  . potassium chloride SA (K-DUR,KLOR-CON) 20 MEQ tablet Take 20 mEq by mouth daily. Taking 10 mg on Mondays, Wednesdays, and Fridays.     No current facility-administered medications on file prior to visit.    Allergies  Allergen Reactions  . Alphagan [Brimonidine]     Arm numbness      Objective: General: Patient is awake, alert, and oriented x 3 and in no acute distress.  Integument: Skin is warm, dry and supple bilateral. Nails are tender, long, thickened and  dystrophic with subungual debris, consistent with onychomycosis, 1-5 bilateral. No signs of infection. No open lesions or preulcerative lesions present bilateral. Remaining integument unremarkable.  Vasculature:  Dorsalis Pedis pulse 1/4 bilateral. Posterior Tibial pulse  1/4 bilateral.  Capillary fill time <3 sec 1-5 bilateral. Scant hair growth to the level of the digits. Temperature gradient within normal limits. Mild varicosities present bilateral. No edema present bilateral.   Neurology: The patient has intact sensation measured with a 5.07/10g Semmes Weinstein Monofilament at all pedal sites bilateral . Vibratory sensation diminished bilateral with tuning fork. No Babinski sign present bilateral.   Musculoskeletal: Mild asymptomatic bunion R>L pedal deformities noted bilateral. Muscular strength 5/5 in all lower extremity muscular groups bilateral without pain or limitation on range of motion . No tenderness with calf compression bilateral.  Assessment and Plan: Problem List Items Addressed This Visit    None  Visit Diagnoses    Dermatophytosis of nail    -  Primary   Foot pain, unspecified laterality       Diabetes mellitus without complication ([place])         -Examined patient. -Discussed and educated patient on diabetic foot care, especially with  regards to the vascular, neurological and musculoskeletal systems.  -Stressed the importance of good glycemic control and the detriment  of not  controlling glucose levels in relation to the foot. -Mechanically debrided all nails 1-5 bilateral using sterile nail nipper and filed with dremel without incident  -Answered all patient questions -Patient to return in 3 months for at risk foot care -Patient advised to call the office if any problems or questions arise in the meantime.  Sharon Frazier, DPM

## 2016-05-09 ENCOUNTER — Ambulatory Visit (INDEPENDENT_AMBULATORY_CARE_PROVIDER_SITE_OTHER): Payer: Medicare Other | Admitting: Primary Care

## 2016-05-09 ENCOUNTER — Encounter: Payer: Self-pay | Admitting: Primary Care

## 2016-05-09 VITALS — BP 120/74 | HR 88 | Temp 97.3°F | Ht 60.0 in | Wt 85.1 lb

## 2016-05-09 DIAGNOSIS — I5032 Chronic diastolic (congestive) heart failure: Secondary | ICD-10-CM | POA: Diagnosis not present

## 2016-05-09 DIAGNOSIS — E119 Type 2 diabetes mellitus without complications: Secondary | ICD-10-CM | POA: Diagnosis not present

## 2016-05-09 DIAGNOSIS — I482 Chronic atrial fibrillation, unspecified: Secondary | ICD-10-CM

## 2016-05-09 DIAGNOSIS — I10 Essential (primary) hypertension: Secondary | ICD-10-CM

## 2016-05-09 LAB — BASIC METABOLIC PANEL
BUN: 46 mg/dL — AB (ref 6–23)
CALCIUM: 9.6 mg/dL (ref 8.4–10.5)
CO2: 25 mEq/L (ref 19–32)
Chloride: 106 mEq/L (ref 96–112)
Creatinine, Ser: 1.67 mg/dL — ABNORMAL HIGH (ref 0.40–1.20)
GFR: 30.52 mL/min — AB (ref >60.00)
Glucose, Bld: 209 mg/dL — ABNORMAL HIGH (ref 70–99)
POTASSIUM: 3.4 meq/L — AB (ref 3.5–5.1)
SODIUM: 140 meq/L (ref 135–145)

## 2016-05-09 LAB — HEMOGLOBIN A1C: HEMOGLOBIN A1C: 4.5 % — AB (ref 4.6–6.5)

## 2016-05-09 NOTE — Progress Notes (Signed)
Pre visit review using our clinic review tool, if applicable. No additional management support is needed unless otherwise documented below in the visit note. 

## 2016-05-09 NOTE — Patient Instructions (Signed)
Complete lab work prior to leaving today. I will notify you of your results once received.   We may stop your diabetes medication called Glipizide XL 2.5 mg. I will call you later today and notify you of my decision.  Please schedule a physical with me in 6 months. You may also schedule a lab only appointment 3-4 days prior. We will discuss your lab results in detail during your physical.  It was a pleasure to see you today!

## 2016-05-09 NOTE — Progress Notes (Signed)
Subjective:    Patient ID: Sharon Frazier, female    DOB: 07-Aug-1922, 82 y.o.   MRN: [id number]  HPI  Ms. Sharon Frazier is a 82 year old female who presents today for follow up.  1) Type 2 Diabetes: Currently managed on Glipizide XL 2.5 mg tablets from prior PCP. Last A1C in January of 5.7. She does not currently check her blood sugars at home. SHe denies weakness, dizziness, feeling jittery. She has noticed a decrease in appetite as she never "craves" anything in particular. She does eat A small breakfast, lunch sometimes, and early dinner.   2) Chronic Diastolic CHF: Currently following with CHF clinic and is managed on lasix 40 mg every other day given decreased renal function. Her last visit with the CHF clinic was in June 2017. She does experience occasional shortness of breath at rest, mostly at night for which she uses her oxygen. She doesn't typically use her oxygen during the day. Denies recent weight gain of 3 pounds within 48 hours, persistent lower extremity edema.  Wt Readings from Last 3 Encounters:  05/09/16 85 lb 1.9 oz (38.6 kg)  03/08/16 86 lb (39 kg)  02/28/16 87 lb (39.5 kg)     3) Atrial Fibrillation: Currently following with Cardiology and is managed on Diltiazem ER 120 mg, Aspirin 81 mg. Denies chest pain, palpitations.   4) Essential Hypertension: Currently managed on Lopressor 25 mg BID, Lisinopril/HCTZ 10-12.5 mg. Her blood pressure in the clinic today is stable at 120/74. She is also managed on oral potassium that she takes 3 days weekly.  Review of Systems  Constitutional: Positive for appetite change.  Respiratory: Negative for shortness of breath and wheezing.   Cardiovascular: Negative for chest pain, palpitations and leg swelling.  Neurological: Negative for dizziness, weakness and numbness.       Past Medical History:  Diagnosis Date  . A-fib ([place])   . Breast cancer ([place])    right  . CHF (congestive heart failure) ([place])   . Diabetes mellitus  without complication ([place])   . Diverticulosis   . GERD (gastroesophageal reflux disease)   . Glaucoma   . H/O: GI bleed   . History of colonic polyps   . HTN (hypertension)   . Hyperlipidemia   . Palpitations   . PUD (peptic ulcer disease)   . PVD (peripheral vascular disease) ([place])      Social History   Social History  . Marital status: Widowed    Spouse name: N/A  . Number of children: N/A  . Years of education: N/A   Occupational History  . Not on file.   Social History Main Topics  . Smoking status: Former Smoker    Packs/day: 2.00    Years: 25.00    Types: Cigarettes    Quit date: 11/07/1987  . Smokeless tobacco: Never Used  . Alcohol use No  . Drug use: No  . Sexual activity: Not on file   Other Topics Concern  . Not on file   Social History Narrative   Widow   Retired. Once worked as a Materials engineer.   Enjoys cross word puzzles.     Past Surgical History:  Procedure Laterality Date  . HIP SURGERY     left hip  . MASTECTOMY     right breast   . RENAL ARTERY STENT    . VAGINAL HYSTERECTOMY      Family History  Problem Relation Age of Onset  . Cancer Sister  Colon cancer    Allergies  Allergen Reactions  . Alphagan [Brimonidine]     Arm numbness      Current Outpatient Prescriptions on File Prior to Visit  Medication Sig Dispense Refill  . aspirin 81 MG tablet Take 81 mg by mouth daily.    Marland Kitchen diltiazem (CARDIZEM SR) 120 MG 12 hr capsule Take 1 capsule (120 mg total) by mouth 2 (two) times daily. (Patient taking differently: Take 120 mg by mouth daily. ) 60 capsule 6  . dorzolamide (TRUSOPT) 2 % ophthalmic solution 1 drop 2 (two) times daily.    . furosemide (LASIX) 40 MG tablet Take 40 mg by mouth 2 (two) times daily as needed.     Marland Kitchen glipiZIDE (GLUCOTROL XL) 2.5 MG 24 hr tablet Take 1 tablet (2.5 mg total) by mouth daily with breakfast. 90 tablet 2  . iron polysaccharides (NIFEREX) 150 MG capsule Take 1 capsule (150 mg total) by mouth 2  (two) times daily. 60 capsule 5  . latanoprost (XALATAN) 0.005 % ophthalmic solution Place 1 drop into both eyes at bedtime.    Marland Kitchen lisinopril-hydrochlorothiazide (PRINZIDE,ZESTORETIC) 10-12.5 MG tablet Take 1 tablet by mouth daily. 90 tablet 3  . metoprolol tartrate (LOPRESSOR) 25 MG tablet TAKE 1 TABLET BY MOUTH TWICE A DAY 60 tablet 3  . omeprazole (PRILOSEC) 20 MG capsule TAKE ONE CAPSULE BY MOUTH TWICE A DAY BEFORE A MEAL 180 capsule 1  . potassium chloride SA (K-DUR,KLOR-CON) 20 MEQ tablet Take 20 mEq by mouth daily. Taking 10 mg on Mondays, Wednesdays, and Fridays.     No current facility-administered medications on file prior to visit.     BP 120/74   Pulse 88   Temp 97.3 F (36.3 C) (Oral)   Ht 5' (1.524 m)   Wt 85 lb 1.9 oz (38.6 kg)   LMP  (LMP Unknown)   SpO2 98%   BMI 16.62 kg/m    Objective:   Physical Exam  Constitutional: She is oriented to person, place, and time. She appears well-nourished.  Neck: Neck supple.  Cardiovascular: Normal rate.   Irregularly irregular rhythm  Pulmonary/Chest: Effort normal and breath sounds normal.  Neurological: She is alert and oriented to person, place, and time.  Skin: Skin is warm and dry.          Assessment & Plan:

## 2016-05-09 NOTE — Assessment & Plan Note (Addendum)
Follows with CHF clinic. Now taking Lasix every several days due to decreased renal function. No abrupt changes in weight or lower extremity edema. Occasional shortness of breath, mostly at night.

## 2016-05-09 NOTE — Assessment & Plan Note (Addendum)
Stable in the clinic today. Continue current regimen. BMP pending.

## 2016-05-09 NOTE — Assessment & Plan Note (Signed)
Irregular rhythm, normal rate today. Continue diltiazem and aspirin daily.

## 2016-05-09 NOTE — Assessment & Plan Note (Signed)
Currently managed on glipizide XL 2.5 mg per prior PCP. Anticipate that this will be discontinued given her lack of appetite in order to avoid hypoglycemic episodes. A1c pending today.

## 2016-05-16 ENCOUNTER — Other Ambulatory Visit (INDEPENDENT_AMBULATORY_CARE_PROVIDER_SITE_OTHER): Payer: Medicare Other

## 2016-05-16 ENCOUNTER — Other Ambulatory Visit: Payer: Self-pay | Admitting: Primary Care

## 2016-05-16 DIAGNOSIS — E876 Hypokalemia: Secondary | ICD-10-CM

## 2016-05-16 DIAGNOSIS — D509 Iron deficiency anemia, unspecified: Secondary | ICD-10-CM

## 2016-05-16 LAB — BASIC METABOLIC PANEL
BUN: 32 mg/dL — AB (ref 6–23)
CO2: 27 mEq/L (ref 19–32)
Calcium: 9.5 mg/dL (ref 8.4–10.5)
Chloride: 104 mEq/L (ref 96–112)
Creatinine, Ser: 1.38 mg/dL — ABNORMAL HIGH (ref 0.40–1.20)
GFR: 38.03 mL/min — AB (ref >60.00)
Glucose, Bld: 156 mg/dL — ABNORMAL HIGH (ref 70–99)
POTASSIUM: 3.6 meq/L (ref 3.5–5.1)
SODIUM: 138 meq/L (ref 135–145)

## 2016-05-17 ENCOUNTER — Telehealth: Payer: Self-pay | Admitting: Primary Care

## 2016-05-17 NOTE — Telephone Encounter (Signed)
Spoken and notified patient's son of Kate's comments. Patient's son  verbalized understanding.   

## 2016-05-17 NOTE — Telephone Encounter (Signed)
Son called back regarding pt's labs Please call [phone] Thank you

## 2016-05-30 ENCOUNTER — Other Ambulatory Visit: Payer: Self-pay | Admitting: *Deleted

## 2016-05-30 ENCOUNTER — Encounter: Payer: Self-pay | Admitting: Family

## 2016-05-30 ENCOUNTER — Ambulatory Visit: Payer: Medicare Other | Attending: Family | Admitting: Family

## 2016-05-30 VITALS — BP 130/40 | HR 56 | Resp 18 | Ht 60.0 in | Wt 87.0 lb

## 2016-05-30 DIAGNOSIS — Z90710 Acquired absence of both cervix and uterus: Secondary | ICD-10-CM | POA: Diagnosis not present

## 2016-05-30 DIAGNOSIS — Z9011 Acquired absence of right breast and nipple: Secondary | ICD-10-CM | POA: Diagnosis not present

## 2016-05-30 DIAGNOSIS — E785 Hyperlipidemia, unspecified: Secondary | ICD-10-CM | POA: Diagnosis not present

## 2016-05-30 DIAGNOSIS — Z87891 Personal history of nicotine dependence: Secondary | ICD-10-CM | POA: Diagnosis not present

## 2016-05-30 DIAGNOSIS — I10 Essential (primary) hypertension: Secondary | ICD-10-CM

## 2016-05-30 DIAGNOSIS — I482 Chronic atrial fibrillation, unspecified: Secondary | ICD-10-CM

## 2016-05-30 DIAGNOSIS — I5032 Chronic diastolic (congestive) heart failure: Secondary | ICD-10-CM | POA: Insufficient documentation

## 2016-05-30 DIAGNOSIS — K5792 Diverticulitis of intestine, part unspecified, without perforation or abscess without bleeding: Secondary | ICD-10-CM | POA: Insufficient documentation

## 2016-05-30 DIAGNOSIS — E119 Type 2 diabetes mellitus without complications: Secondary | ICD-10-CM | POA: Diagnosis not present

## 2016-05-30 DIAGNOSIS — H409 Unspecified glaucoma: Secondary | ICD-10-CM | POA: Diagnosis not present

## 2016-05-30 DIAGNOSIS — Z86010 Personal history of colonic polyps: Secondary | ICD-10-CM | POA: Diagnosis not present

## 2016-05-30 DIAGNOSIS — Z853 Personal history of malignant neoplasm of breast: Secondary | ICD-10-CM | POA: Diagnosis not present

## 2016-05-30 DIAGNOSIS — Z800 Family history of malignant neoplasm of digestive organs: Secondary | ICD-10-CM | POA: Diagnosis not present

## 2016-05-30 DIAGNOSIS — Z8711 Personal history of peptic ulcer disease: Secondary | ICD-10-CM | POA: Diagnosis not present

## 2016-05-30 DIAGNOSIS — R002 Palpitations: Secondary | ICD-10-CM | POA: Insufficient documentation

## 2016-05-30 DIAGNOSIS — K219 Gastro-esophageal reflux disease without esophagitis: Secondary | ICD-10-CM | POA: Diagnosis not present

## 2016-05-30 DIAGNOSIS — I110 Hypertensive heart disease with heart failure: Secondary | ICD-10-CM | POA: Diagnosis not present

## 2016-05-30 DIAGNOSIS — I739 Peripheral vascular disease, unspecified: Secondary | ICD-10-CM | POA: Diagnosis not present

## 2016-05-30 DIAGNOSIS — Z888 Allergy status to other drugs, medicaments and biological substances status: Secondary | ICD-10-CM | POA: Insufficient documentation

## 2016-05-30 DIAGNOSIS — Z7982 Long term (current) use of aspirin: Secondary | ICD-10-CM | POA: Insufficient documentation

## 2016-05-30 MED ORDER — POTASSIUM CHLORIDE CRYS ER 20 MEQ PO TBCR
10.0000 meq | EXTENDED_RELEASE_TABLET | ORAL | 1 refills | Status: DC
Start: 2016-05-31 — End: 2016-09-06

## 2016-05-30 NOTE — Progress Notes (Signed)
Subjective:    Patient ID: Girtha Rm, female    DOB: Jul 09, 1922, 82 y.o.   MRN: [id number]  Congestive Heart Failure  Presents for follow-up visit. The disease course has been stable. Associated symptoms include fatigue and shortness of breath. Pertinent negatives include no abdominal pain, chest pain, edema, orthopnea or palpitations. The symptoms have been stable. Past treatments include ACE inhibitors, beta blockers, salt and fluid restriction and oxygen. The treatment provided moderate relief. Compliance with prior treatments has been good. Her past medical history is significant for arrhythmia, DM and HTN.  Atrial Fibrillation  Presents for follow-up visit. Symptoms include shortness of breath. Symptoms are negative for chest pain, dizziness, hypotension, palpitations and weakness. The symptoms have been stable. Past treatments include Ca channel blockers, beta blockers and aspirin. Compliance with prior treatments has been good. Past medical history includes atrial fibrillation, CHF and HTN.   Past Medical History:  Diagnosis Date  . A-fib ([place])   . Breast cancer ([place])    right  . CHF (congestive heart failure) ([place])   . Diabetes mellitus without complication ([place])   . Diverticulosis   . GERD (gastroesophageal reflux disease)   . Glaucoma   . H/O: GI bleed   . History of colonic polyps   . HTN (hypertension)   . Hyperlipidemia   . Palpitations   . PUD (peptic ulcer disease)   . PVD (peripheral vascular disease) ([place])     Past Surgical History:  Procedure Laterality Date  . HIP SURGERY     left hip  . MASTECTOMY     right breast   . RENAL ARTERY STENT    . VAGINAL HYSTERECTOMY      Family History  Problem Relation Age of Onset  . Cancer Sister     Colon cancer    Social History  Substance Use Topics  . Smoking status: Former Smoker    Packs/day: 2.00    Years: 25.00    Types: Cigarettes    Quit date: 11/07/1987  . Smokeless tobacco: Never Used  .  Alcohol use No    Allergies  Allergen Reactions  . Alphagan [Brimonidine]     Arm numbness      Prior to Admission medications   Medication Sig Start Date End Date Taking? Authorizing Provider  aspirin 81 MG tablet Take 81 mg by mouth daily.   Yes Historical Provider, MD  diltiazem (CARDIZEM SR) 120 MG 12 hr capsule Take 1 capsule (120 mg total) by mouth 2 (two) times daily. Patient taking differently: Take 120 mg by mouth daily.  01/03/16  Yes Minna Merritts, MD  dorzolamide (TRUSOPT) 2 % ophthalmic solution 1 drop 2 (two) times daily.   Yes Historical Provider, MD  furosemide (LASIX) 40 MG tablet Take 40 mg by mouth daily.    Yes Historical Provider, MD  latanoprost (XALATAN) 0.005 % ophthalmic solution Place 1 drop into both eyes at bedtime.   Yes Historical Provider, MD  lisinopril-hydrochlorothiazide (PRINZIDE,ZESTORETIC) 10-12.5 MG tablet Take 1 tablet by mouth daily. Patient taking differently: Take 0.5 tablets by mouth daily.  03/20/16  Yes Pleas Koch, NP  metoprolol tartrate (LOPRESSOR) 25 MG tablet Take 12.5 mg by mouth 2 (two) times daily.   Yes Historical Provider, MD  omeprazole (PRILOSEC) 20 MG capsule TAKE ONE CAPSULE BY MOUTH TWICE A DAY BEFORE A MEAL 04/10/16  Yes Pleas Koch, NP  POLY-IRON 150 150 MG capsule TAKE ONE (1) CAPSULE BY MOUTH 2 TIMES DAILY  05/16/16  Yes Pleas Koch, NP  potassium chloride SA (K-DUR,KLOR-CON) 20 MEQ tablet Take 0.5 tablets (10 mEq total) by mouth 3 (three) times a week. 05/31/16   Pleas Koch, NP      Review of Systems  Constitutional: Positive for appetite change ("continued decreased") and fatigue.  HENT: Positive for sneezing. Negative for congestion, postnasal drip and sore throat.   Eyes: Negative.   Respiratory: Positive for cough and shortness of breath. Negative for chest tightness.   Cardiovascular: Negative for chest pain, palpitations and leg swelling.  Gastrointestinal: Negative for abdominal distention  and abdominal pain.  Endocrine: Negative.   Genitourinary: Negative.   Musculoskeletal: Negative.   Skin: Negative.   Allergic/Immunologic: Negative.   Neurological: Positive for light-headedness. Negative for dizziness, weakness and headaches.  Hematological: Negative for adenopathy. Does not bruise/bleed easily.  Psychiatric/Behavioral: Negative for dysphoric mood and sleep disturbance (sleeping on 3 pillows with oxygen at 2L). The patient is not nervous/anxious.        Objective:   Physical Exam  Constitutional: She is oriented to person, place, and time. She appears well-developed and well-nourished.  HENT:  Head: Normocephalic and atraumatic.  Eyes: Conjunctivae are normal. Pupils are equal, round, and reactive to light.  Neck: Normal range of motion. Neck supple.  Cardiovascular: An irregular rhythm present. Bradycardia present.   Pulmonary/Chest: Effort normal. She has no wheezes. She has no rales.  Abdominal: Soft. She exhibits no distension. There is no tenderness.  Musculoskeletal: She exhibits no edema or tenderness.  Neurological: She is alert and oriented to person, place, and time.  Skin: Skin is warm and dry.  Psychiatric: She has a normal mood and affect. Her behavior is normal. Thought content normal.  Nursing note and vitals reviewed.   BP (!) 130/40   Pulse (!) 56   Resp 18   Ht 5' (1.524 m)   Wt 87 lb (39.5 kg)   LMP  (LMP Unknown)   SpO2 100%   BMI 16.99 kg/m        Assessment & Plan:  1: Chronic heart failure with preserved ejection fraction- Patient presents with fatigue and shortness of breath with minimal exertion (Class III). Symptoms do improve quickly upon rest. She denies any swelling in her legs or abdomen. She continues to weigh herself and says that her weight has been stable. By our scale, her weight is unchanged and she was reminded to call for an overnight weight gain of >2 pounds or a weekly weight gain of >5 pounds. She says that she  continues to have a decreased appetite and just doesn't feel hungry. She is not adding any salt to her food. She does sleep with oxygen on at 2L. 2: Atrial fibrillation- Currently bradycardic and has had her lopressor decreased in half. She still takes diltiazem along with a baby aspirin daily.  3: HTN- Blood pressure looks good today. Continue medications at this time. 4: Diabetes- She's recently had her glipizide stopped by her PCP due to her A1C.   Medication bottles were reviewed.  Return here in 3 months or sooner for any questions/problems before then.

## 2016-05-30 NOTE — Patient Instructions (Signed)
Continue weighing daily and call for an overnight weight gain of > 2 pounds or a weekly weight gain of >5 pounds. 

## 2016-07-31 ENCOUNTER — Other Ambulatory Visit: Payer: Self-pay | Admitting: Cardiovascular Disease

## 2016-07-31 ENCOUNTER — Telehealth: Payer: Self-pay | Admitting: *Deleted

## 2016-07-31 DIAGNOSIS — I10 Essential (primary) hypertension: Secondary | ICD-10-CM

## 2016-07-31 MED ORDER — LISINOPRIL-HYDROCHLOROTHIAZIDE 10-12.5 MG PO TABS: 0.5000 | ORAL_TABLET | Freq: Every day | ORAL | 3 refills | Status: DC

## 2016-07-31 NOTE — Telephone Encounter (Signed)
Noted. New Rx sent with correct instructions.

## 2016-07-31 NOTE — Telephone Encounter (Signed)
Amy at [facility] left a message that they have a script for patient for  Lisinopril-HCTZ  10-12.5, take one daily. Amy stated that she talked with patient's son today and was told that patient is only taking 1/2 daily. Amy requested that if this is correct to please send over a new script to [facility] with correct instructions.

## 2016-08-04 ENCOUNTER — Ambulatory Visit (INDEPENDENT_AMBULATORY_CARE_PROVIDER_SITE_OTHER): Payer: Medicare Other | Admitting: Podiatry

## 2016-08-04 VITALS — BP 99/44 | HR 66 | Resp 16

## 2016-08-04 DIAGNOSIS — E0843 Diabetes mellitus due to underlying condition with diabetic autonomic (poly)neuropathy: Secondary | ICD-10-CM

## 2016-08-04 DIAGNOSIS — B351 Tinea unguium: Secondary | ICD-10-CM | POA: Diagnosis not present

## 2016-08-04 DIAGNOSIS — L603 Nail dystrophy: Secondary | ICD-10-CM

## 2016-08-04 DIAGNOSIS — M79609 Pain in unspecified limb: Secondary | ICD-10-CM | POA: Diagnosis not present

## 2016-08-04 DIAGNOSIS — L608 Other nail disorders: Secondary | ICD-10-CM

## 2016-08-08 NOTE — Progress Notes (Signed)
SUBJECTIVE Patient with a history of diabetes mellitus presents to office today complaining of elongated, thickened nails. Pain while ambulating in shoes. Patient is unable to trim their own nails.   Allergies  Allergen Reactions  . Alphagan [Brimonidine]     Arm numbness      OBJECTIVE General Patient is awake, alert, and oriented x 3 and in no acute distress. Derm Skin is dry and supple bilateral. Negative open lesions or macerations. Remaining integument unremarkable. Nails are tender, long, thickened and dystrophic with subungual debris, consistent with onychomycosis, 1-5 bilateral. No signs of infection noted. Vasc  DP and PT pedal pulses palpable bilaterally. Temperature gradient within normal limits.  Neuro Epicritic and protective threshold sensation diminished bilaterally.  Musculoskeletal Exam No symptomatic pedal deformities noted bilateral. Muscular strength within normal limits.  ASSESSMENT 1. Diabetes Mellitus w/ peripheral neuropathy 2. Onychomycosis of nail due to dermatophyte bilateral 3. Pain in foot bilateral  PLAN OF CARE 1. Patient evaluated today. 2. Instructed to maintain good pedal hygiene and foot care. Stressed importance of controlling blood sugar.  3. Mechanical debridement of nails 1-5 bilaterally performed using a nail nipper. Filed with dremel without incident.  4. Return to clinic in 3 mos.    Edrick Kins, DPM

## 2016-09-04 ENCOUNTER — Encounter: Payer: Self-pay | Admitting: Cardiovascular Disease

## 2016-09-04 ENCOUNTER — Ambulatory Visit (INDEPENDENT_AMBULATORY_CARE_PROVIDER_SITE_OTHER): Payer: Medicare Other | Admitting: Cardiovascular Disease

## 2016-09-04 VITALS — BP 139/68 | HR 62 | Ht 60.0 in | Wt 85.5 lb

## 2016-09-04 DIAGNOSIS — E119 Type 2 diabetes mellitus without complications: Secondary | ICD-10-CM

## 2016-09-04 DIAGNOSIS — I482 Chronic atrial fibrillation, unspecified: Secondary | ICD-10-CM

## 2016-09-04 DIAGNOSIS — I5032 Chronic diastolic (congestive) heart failure: Secondary | ICD-10-CM | POA: Diagnosis not present

## 2016-09-04 DIAGNOSIS — E782 Mixed hyperlipidemia: Secondary | ICD-10-CM | POA: Diagnosis not present

## 2016-09-04 DIAGNOSIS — I10 Essential (primary) hypertension: Secondary | ICD-10-CM | POA: Diagnosis not present

## 2016-09-04 NOTE — Progress Notes (Signed)
Cardiology Office Note  Date:  09/04/2016   ID:  Sharon Frazier, DOB 11-22-1921, MRN [id number]  PCP:  Sheral Flow, NP   Chief Complaint  Patient presents with  . other    69mo f/u. Pt c/o no energy. Reviewed meds with pt verbally.    HPI:  Sharon Frazier is a pleasant 82 year old woman with history of GI bleed, iron deficiency anemia, laser treatment of gastric angiodysplasias , peptic ulcer disease, GERD, diverticulosis and internal hemorrhoids, hypertension, hyperlipidemia, type 2 diabetes, previous history of tobacco abuse, right breast cancer status post mastectomy and chemotherapy with hospital admission 12/11/2013 and discharged 12/14/2013 for new onset atrial fibrillation. she developed bronchitis with weakness and cough with sputum. In the emergency room she was found to have atrial fibrillation with RVR. She was started on rate control medications and she was felt to be a poor candidate for anticoagulation. Unclear exactly how long she had been in atrial fibrillation. She presents today for routine follow-up of her atrial fibrillation  In follow-up she reports that she is doing well on She is taking Lasix daily, trace edema When she tried to cut back on her Lasix she had worsening ankle swelling Weight stable, try to eat more No regular exercise program Drinks significant fluids daily, 5 bottles of water, does not feel dehydrated Husband does all of her medications, puts them in a pillbox Denies any tachycardia, chest discomfort walks with a cane, no recent falls  hemoglobin A1c December 2016 was 5.7  EKG on today's visit shows atrial fibrillation rate 56 bpm  Other past medical history in the hospital at the end of July 2015. She presented with shortness of breath and leg swelling. She had pleural effusion. She was started on Lasix with aggressive diuresis. Discharged on Lasix 40 mg twice a day. She was not on Lasix prior to her admission She did have  thoracentesis on the right during her hospital stay. Discharge on 04/24/2014. She did have one unit packed red blood cells Prior echocardiogram with ejection fraction 65%.  In the hospital CT scan of the chest showing possible bronchopneumonia, moderate bilateral pleural effusions, right greater than left, atherosclerosis of the coronary arteries, emphysema, cirrhosis, abdominal ascites  seen in the heart failure clinic 05/25/2014. No medication changes made at that time.  Echocardiogram showed ejection fraction 60-65%, mildly elevated right ventricular systolic pressures Notes indicate she had been seen by Dr. Vira Agar recently with EGD and electrocautery. She still had trace blood in her stool on followup visits with him. She was started on diltiazem 120 mg daily, amlodipine was held, she was started on metoprolol   PMH:   has a past medical history of A-fib ([place]); Breast cancer ([place]); CHF (congestive heart failure) ([place]); Diabetes mellitus without complication ([place]); Diverticulosis; GERD (gastroesophageal reflux disease); Glaucoma; H/O: GI bleed; History of colonic polyps; HTN (hypertension); Hyperlipidemia; Palpitations; PUD (peptic ulcer disease); and PVD (peripheral vascular disease) ([place]).  PSH:    Past Surgical History:  Procedure Laterality Date  . HIP SURGERY     left hip  . MASTECTOMY     right breast   . RENAL ARTERY STENT    . VAGINAL HYSTERECTOMY      Current Outpatient Prescriptions  Medication Sig Dispense Refill  . aspirin 81 MG tablet Take 81 mg by mouth daily.    Marland Kitchen diltiazem (CARDIZEM SR) 120 MG 12 hr capsule Take 1 capsule (120 mg total) by mouth 2 (two) times daily. (Patient taking differently: Take 120 mg  by mouth daily. ) 60 capsule 6  . dorzolamide (TRUSOPT) 2 % ophthalmic solution 1 drop 2 (two) times daily.    . furosemide (LASIX) 40 MG tablet Take 40 mg by mouth daily.     Marland Kitchen latanoprost (XALATAN) 0.005 % ophthalmic solution Place 1 drop into both eyes at  bedtime.    Marland Kitchen lisinopril-hydrochlorothiazide (PRINZIDE,ZESTORETIC) 10-12.5 MG tablet Take 0.5 tablets by mouth daily. 45 tablet 3  . metoprolol tartrate (LOPRESSOR) 25 MG tablet Take 12.5 mg by mouth 2 (two) times daily.    Marland Kitchen omeprazole (PRILOSEC) 20 MG capsule TAKE ONE CAPSULE BY MOUTH TWICE A DAY BEFORE A MEAL 180 capsule 1  . POLY-IRON 150 150 MG capsule TAKE ONE (1) CAPSULE BY MOUTH 2 TIMES DAILY 60 capsule 5  . potassium chloride SA (K-DUR,KLOR-CON) 20 MEQ tablet Take 0.5 tablets (10 mEq total) by mouth 3 (three) times a week. 30 tablet 1   No current facility-administered medications for this visit.      Allergies:   Alphagan [brimonidine]   Social History:  The patient  reports that she quit smoking about 28 years ago. Her smoking use included Cigarettes. She has a 50.00 pack-year smoking history. She has never used smokeless tobacco. She reports that she does not drink alcohol or use drugs.   Family History:   family history includes Cancer in her sister.    Review of Systems: Review of Systems  Constitutional: Negative.   Respiratory: Negative.   Cardiovascular: Negative.   Gastrointestinal: Negative.   Musculoskeletal: Negative.   Neurological: Negative.   Psychiatric/Behavioral: Negative.   All other systems reviewed and are negative.    PHYSICAL EXAM: VS:  BP 139/68 (BP Location: Left Arm, Patient Position: Sitting, Cuff Size: Normal)   Pulse 62   Ht 5' (1.524 m)   Wt 85 lb 8 oz (38.8 kg)   LMP  (LMP Unknown)   BMI 16.70 kg/m  , BMI Body mass index is 16.7 kg/m. GEN: Well nourished, well developed, in no acute distress  HEENT: normal  Neck: no JVD, carotid bruits, or masses Cardiac: RRR; no murmurs, rubs, or gallops,no edema  Respiratory:  clear to auscultation bilaterally, normal work of breathing GI: soft, nontender, nondistended, + BS Sharon: no deformity or atrophy  Skin: warm and dry, no rash Neuro:  Strength and sensation are intact Psych: euthymic mood,  full affect    Recent Labs: 05/16/2016: BUN 32; Creatinine, Ser 1.38; Potassium 3.6; Sodium 138    Lipid Panel Lab Results  Component Value Date   CHOL 101 02/06/2014   HDL 38 (L) 02/06/2014   LDLCALC 48 02/06/2014   TRIG 75 02/06/2014      Wt Readings from Last 3 Encounters:  09/04/16 85 lb 8 oz (38.8 kg)  05/30/16 87 lb (39.5 kg)  05/09/16 85 lb 1.9 oz (38.6 kg)       ASSESSMENT AND PLAN:  Chronic atrial fibrillation (HCC) - Plan: EKG 12-Lead Rate well controlled, Long discussion concerning anticoagulation Long history of GI bleed. Patient and other family members in the room are in agreement they do not want anticoagulation. They are aware of risk of stroke  Mixed hyperlipidemia  Chronic diastolic CHF (congestive heart failure) ([place])  Essential hypertension Blood pressure is well controlled on today's visit. No changes made to the medications.  Controlled type 2 diabetes mellitus without complication, without long-term current use of insulin ([place]) Numbers improved in the past, recommended no further weight loss   Total encounter time  more than 25 minutes  Greater than 50% was spent in counseling and coordination of care with the patient   Disposition:   F/U  6 months   Orders Placed This Encounter  Procedures  . EKG 12-Lead     Signed, Esmond Plants, M.D., Ph.D. 09/04/2016  [place], [place]

## 2016-09-04 NOTE — Patient Instructions (Signed)
Medication Instructions:   Please increase potassium up to 1/2 pill Monday through Friday  Labwork:  No new labs needed  Testing/Procedures:  No further testing at this time   I recommend watching educational videos on topics of interest to you at:       [email or web address]  Enter code: [email or web address]    Follow-Up: It was a pleasure seeing you in the office today. Please call us if you have new issues that need to be addressed before your next appt.  [phone]  Your physician wants you to follow-up in: 6 months.  You will receive a reminder letter in the mail two months in advance. If you don't receive a letter, please call our office to schedule the follow-up appointment.  If you need a refill on your cardiac medications before your next appointment, please call your pharmacy.

## 2016-09-06 ENCOUNTER — Ambulatory Visit: Payer: Medicare Other | Attending: Family | Admitting: Family

## 2016-09-06 ENCOUNTER — Encounter: Payer: Self-pay | Admitting: Family

## 2016-09-06 VITALS — BP 112/49 | HR 66 | Resp 18 | Ht 60.0 in | Wt 85.0 lb

## 2016-09-06 DIAGNOSIS — I110 Hypertensive heart disease with heart failure: Secondary | ICD-10-CM | POA: Insufficient documentation

## 2016-09-06 DIAGNOSIS — Z853 Personal history of malignant neoplasm of breast: Secondary | ICD-10-CM | POA: Diagnosis not present

## 2016-09-06 DIAGNOSIS — I739 Peripheral vascular disease, unspecified: Secondary | ICD-10-CM | POA: Diagnosis not present

## 2016-09-06 DIAGNOSIS — I4891 Unspecified atrial fibrillation: Secondary | ICD-10-CM | POA: Diagnosis not present

## 2016-09-06 DIAGNOSIS — Z7982 Long term (current) use of aspirin: Secondary | ICD-10-CM | POA: Insufficient documentation

## 2016-09-06 DIAGNOSIS — E119 Type 2 diabetes mellitus without complications: Secondary | ICD-10-CM | POA: Insufficient documentation

## 2016-09-06 DIAGNOSIS — Z87891 Personal history of nicotine dependence: Secondary | ICD-10-CM | POA: Insufficient documentation

## 2016-09-06 DIAGNOSIS — K5790 Diverticulosis of intestine, part unspecified, without perforation or abscess without bleeding: Secondary | ICD-10-CM | POA: Diagnosis not present

## 2016-09-06 DIAGNOSIS — E785 Hyperlipidemia, unspecified: Secondary | ICD-10-CM | POA: Insufficient documentation

## 2016-09-06 DIAGNOSIS — I5032 Chronic diastolic (congestive) heart failure: Secondary | ICD-10-CM

## 2016-09-06 DIAGNOSIS — I10 Essential (primary) hypertension: Secondary | ICD-10-CM | POA: Insufficient documentation

## 2016-09-06 DIAGNOSIS — I509 Heart failure, unspecified: Secondary | ICD-10-CM | POA: Insufficient documentation

## 2016-09-06 DIAGNOSIS — R001 Bradycardia, unspecified: Secondary | ICD-10-CM | POA: Insufficient documentation

## 2016-09-06 DIAGNOSIS — H409 Unspecified glaucoma: Secondary | ICD-10-CM | POA: Insufficient documentation

## 2016-09-06 DIAGNOSIS — I482 Chronic atrial fibrillation, unspecified: Secondary | ICD-10-CM

## 2016-09-06 DIAGNOSIS — K219 Gastro-esophageal reflux disease without esophagitis: Secondary | ICD-10-CM | POA: Diagnosis not present

## 2016-09-06 DIAGNOSIS — K279 Peptic ulcer, site unspecified, unspecified as acute or chronic, without hemorrhage or perforation: Secondary | ICD-10-CM | POA: Diagnosis not present

## 2016-09-06 NOTE — Progress Notes (Signed)
Patient ID: Sharon Frazier, female    DOB: 1922/06/17, 82 y.o.   MRN: [id number]  HPI  Sharon Frazier is a 82 y/o female with a history of PVD, HTN, PUD, hyperlipidemia, GI bleed, GERD, glaucoma, DM, breast cancer, atrial fibrillation and remote tobacco use.  Last echo was done and showed an EF of 65% along with mild MR/TR. Mildly elevated PA pressure.   Has not been admitted in a few years. Not since 2015.  She presents today for a follow-up visit with minimal fatigue and shortness of breath with exertion. Little bit of swelling around her ankles. Continues to weigh daily and says that her weight has been stable. Continues to not have much of an appetite.   Past Medical History:  Diagnosis Date  . A-fib ([place])   . Breast cancer ([place])    right  . CHF (congestive heart failure) ([place])   . Diabetes mellitus without complication ([place])   . Diverticulosis   . GERD (gastroesophageal reflux disease)   . Glaucoma   . H/O: GI bleed   . History of colonic polyps   . HTN (hypertension)   . Hyperlipidemia   . Palpitations   . PUD (peptic ulcer disease)   . PVD (peripheral vascular disease) ([place])    Past Surgical History:  Procedure Laterality Date  . HIP SURGERY     left hip  . MASTECTOMY     right breast   . RENAL ARTERY STENT    . VAGINAL HYSTERECTOMY      Family History  Problem Relation Age of Onset  . Cancer Sister     Colon cancer    Social History  Substance Use Topics  . Smoking status: Former Smoker    Packs/day: 2.00    Years: 25.00    Types: Cigarettes    Quit date: 11/07/1987  . Smokeless tobacco: Never Used  . Alcohol use No    Allergies  Allergen Reactions  . Alphagan [Brimonidine]     Arm numbness     Prior to Admission medications   Medication Sig Start Date End Date Taking? Authorizing Provider  aspirin 81 MG tablet Take 81 mg by mouth daily.   Yes Historical Provider, MD  diltiazem (CARDIZEM SR) 120 MG 12 hr capsule Take 1 capsule (120 mg total)  by mouth 2 (two) times daily. Patient taking differently: Take 120 mg by mouth daily.  01/03/16  Yes Minna Merritts, MD  dorzolamide (TRUSOPT) 2 % ophthalmic solution 1 drop 2 (two) times daily.   Yes Historical Provider, MD  furosemide (LASIX) 40 MG tablet Take 40 mg by mouth daily.    Yes Historical Provider, MD  latanoprost (XALATAN) 0.005 % ophthalmic solution Place 1 drop into both eyes at bedtime.   Yes Historical Provider, MD  lisinopril-hydrochlorothiazide (PRINZIDE,ZESTORETIC) 10-12.5 MG tablet Take 0.5 tablets by mouth daily. 07/31/16  Yes Pleas Koch, NP  metoprolol tartrate (LOPRESSOR) 25 MG tablet Take 12.5 mg by mouth 2 (two) times daily.   Yes Historical Provider, MD  omeprazole (PRILOSEC) 20 MG capsule TAKE ONE CAPSULE BY MOUTH TWICE A DAY BEFORE A MEAL 04/10/16  Yes Pleas Koch, NP  POLY-IRON 150 150 MG capsule TAKE ONE (1) CAPSULE BY MOUTH 2 TIMES DAILY 05/16/16  Yes Pleas Koch, NP  potassium chloride SA (K-DUR,KLOR-CON) 20 MEQ tablet Take 10 mEq by mouth. 1/2 tablet Mon- Fri   Yes Historical Provider, MD     Review of Systems  Constitutional: Positive  for fatigue (mild). Negative for appetite change.  HENT: Negative for congestion, postnasal drip and sore throat.   Eyes: Negative.   Respiratory: Positive for shortness of breath. Negative for cough and chest tightness.   Cardiovascular: Positive for palpitations and leg swelling (trace amount around ankles). Negative for chest pain.  Gastrointestinal: Negative for abdominal distention and abdominal pain.  Endocrine: Negative.   Genitourinary: Negative.   Musculoskeletal: Negative for back pain and neck pain.  Skin: Negative.   Allergic/Immunologic: Negative.   Neurological: Negative for dizziness and light-headedness.  Hematological: Negative for adenopathy. Does not bruise/bleed easily.  Psychiatric/Behavioral: Negative for dysphoric mood and sleep disturbance (wearing oxygen @ 2L). The patient is not  nervous/anxious.    Vitals:   09/06/16 1258  BP: (!) 112/49  Pulse: 66  Resp: 18  Weight: 85 lb (38.6 kg)  Height: 5' (1.524 m)   Wt Readings from Last 3 Encounters:  09/06/16 85 lb (38.6 kg)  09/04/16 85 lb 8 oz (38.8 kg)  05/30/16 87 lb (39.5 kg)   Lab Results  Component Value Date   CREATININE 1.38 (H) 05/16/2016   CREATININE 1.67 (H) 05/09/2016   CREATININE 1.17 (H) 10/11/2015    Physical Exam  Constitutional: She is oriented to person, place, and time. She appears well-developed and well-nourished.  HENT:  Head: Normocephalic and atraumatic.  Eyes: Conjunctivae are normal. Pupils are equal, round, and reactive to light.  Neck: Normal range of motion. Neck supple.  Cardiovascular: An irregular rhythm present. Bradycardia present.   Pulmonary/Chest: Effort normal. She has no wheezes. She has no rales.  Abdominal: Soft. She exhibits no distension. There is no tenderness.  Musculoskeletal: She exhibits edema (trace amount edema around bilateral ankles). She exhibits no tenderness.  Neurological: She is alert and oriented to person, place, and time.  Skin: Skin is warm and dry.  Psychiatric: She has a normal mood and affect. Her behavior is normal. Thought content normal.  Nursing note and vitals reviewed.   Assessment & Plan:  1: Chronic heart failure with preserved ejection fraction-  - NYHA class II - euvolemic - weight down an additional 2 pounds. Encouraged small more frequent meals. Reminded to call for an overnight weight gain of >2 pounds or a weekly weight gain of >5 pounds - not adding salt - continues to sleep with oxygen on at 2L - did receive flu vaccine for this season  2: Atrial fibrillation-  - continues to be bradycardia - diltiazem has been decreased  3: HTN-  - Blood pressure looks good today.  - Continue medications at this time.  4: Diabetes-  - continues off of glipizide at this time - recently seen PCP on 05/09/16  Return here in 6  months or sooner for any questions/problems before then.

## 2016-09-06 NOTE — Patient Instructions (Signed)
Continue weighing daily and call for an overnight weight gain of > 2 pounds or a weekly weight gain of >5 pounds. 

## 2016-10-14 ENCOUNTER — Other Ambulatory Visit: Payer: Self-pay | Admitting: Primary Care

## 2016-11-01 ENCOUNTER — Other Ambulatory Visit: Payer: Self-pay | Admitting: Cardiovascular Disease

## 2016-11-06 ENCOUNTER — Encounter: Payer: Self-pay | Admitting: Primary Care

## 2016-11-06 ENCOUNTER — Ambulatory Visit (INDEPENDENT_AMBULATORY_CARE_PROVIDER_SITE_OTHER): Payer: Medicare Other | Admitting: Primary Care

## 2016-11-06 ENCOUNTER — Ambulatory Visit (INDEPENDENT_AMBULATORY_CARE_PROVIDER_SITE_OTHER): Payer: Medicare Other

## 2016-11-06 ENCOUNTER — Telehealth: Payer: Self-pay | Admitting: Radiology

## 2016-11-06 ENCOUNTER — Other Ambulatory Visit: Payer: Self-pay | Admitting: Primary Care

## 2016-11-06 VITALS — BP 110/70 | HR 60 | Temp 98.6°F | Ht 60.0 in | Wt 91.5 lb

## 2016-11-06 DIAGNOSIS — E785 Hyperlipidemia, unspecified: Secondary | ICD-10-CM | POA: Diagnosis not present

## 2016-11-06 DIAGNOSIS — E119 Type 2 diabetes mellitus without complications: Secondary | ICD-10-CM

## 2016-11-06 DIAGNOSIS — D649 Anemia, unspecified: Secondary | ICD-10-CM

## 2016-11-06 DIAGNOSIS — I482 Chronic atrial fibrillation, unspecified: Secondary | ICD-10-CM

## 2016-11-06 DIAGNOSIS — Z0000 Encounter for general adult medical examination without abnormal findings: Secondary | ICD-10-CM | POA: Diagnosis not present

## 2016-11-06 DIAGNOSIS — I5032 Chronic diastolic (congestive) heart failure: Secondary | ICD-10-CM

## 2016-11-06 DIAGNOSIS — I10 Essential (primary) hypertension: Secondary | ICD-10-CM | POA: Diagnosis not present

## 2016-11-06 DIAGNOSIS — Z23 Encounter for immunization: Secondary | ICD-10-CM | POA: Diagnosis not present

## 2016-11-06 DIAGNOSIS — E782 Mixed hyperlipidemia: Secondary | ICD-10-CM

## 2016-11-06 LAB — CBC
HCT: 21.1 % — CL (ref 36.0–46.0)
MCHC: 30.5 g/dL (ref 30.0–36.0)
MCV: 76.6 fl — ABNORMAL LOW (ref 78.0–100.0)
Platelets: 204 10*3/uL (ref 150.0–400.0)
RBC: 2.76 Mil/uL — ABNORMAL LOW (ref 3.87–5.11)
RDW: 19.7 % — AB (ref 11.5–15.5)
WBC: 3.8 10*3/uL — AB (ref 4.0–10.5)

## 2016-11-06 LAB — COMPREHENSIVE METABOLIC PANEL
ALT: 9 U/L (ref 0–35)
AST: 20 U/L (ref 0–37)
Albumin: 3.3 g/dL — ABNORMAL LOW (ref 3.5–5.2)
Alkaline Phosphatase: 98 U/L (ref 39–117)
BILIRUBIN TOTAL: 0.4 mg/dL (ref 0.2–1.2)
BUN: 42 mg/dL — AB (ref 6–23)
CO2: 25 meq/L (ref 19–32)
Calcium: 9.4 mg/dL (ref 8.4–10.5)
Chloride: 107 mEq/L (ref 96–112)
Creatinine, Ser: 1.87 mg/dL — ABNORMAL HIGH (ref 0.40–1.20)
GFR: 26.75 mL/min — AB (ref >60.00)
GLUCOSE: 112 mg/dL — AB (ref 70–99)
Potassium: 3.5 mEq/L (ref 3.5–5.1)
SODIUM: 139 meq/L (ref 135–145)
TOTAL PROTEIN: 6.6 g/dL (ref 6.0–8.3)

## 2016-11-06 LAB — LIPID PANEL
CHOL/HDL RATIO: 3
Cholesterol: 139 mg/dL (ref 0–200)
HDL: 41.5 mg/dL (ref >39.00)
LDL Cholesterol: 75 mg/dL (ref 0–99)
NONHDL: 97.83
Triglycerides: 115 mg/dL (ref 0.0–149.0)
VLDL: 23 mg/dL (ref 0.0–40.0)

## 2016-11-06 LAB — HEMOGLOBIN A1C: Hgb A1c MFr Bld: 5.3 % (ref 4.6–6.5)

## 2016-11-06 MED ORDER — POTASSIUM CHLORIDE CRYS ER 10 MEQ PO TBCR: EXTENDED_RELEASE_TABLET | ORAL | 1 refills | Status: DC

## 2016-11-06 MED ORDER — POTASSIUM CHLORIDE CRYS ER 10 MEQ PO TBCR: EXTENDED_RELEASE_TABLET | ORAL | 1 refills | Status: AC

## 2016-11-06 MED ORDER — METOPROLOL TARTRATE 25 MG PO TABS: 12.5000 mg | ORAL_TABLET | Freq: Two times a day (BID) | ORAL | 3 refills | Status: AC

## 2016-11-06 NOTE — Telephone Encounter (Signed)
Attempted to call patient's son with results, he's unavailable, will call back later this evening.

## 2016-11-06 NOTE — Patient Instructions (Signed)
Sharon Frazier , Thank you for taking time to come for your Medicare Wellness Visit. I appreciate your ongoing commitment to your health goals. Please review the following plan we discussed and let me know if I can assist you in the future.   These are the goals we discussed: Goals    . safety          Starting 11/06/2016, I will continue to use cane daily as needed to reduce risk of falls.        This is a list of the screening recommended for you and due dates:  Health Maintenance  Topic Date Due  . Pneumonia vaccines (2 of 2 - PCV13) 11/06/2016*  . DEXA scan (bone density measurement)  11/06/2025*  . Tetanus Vaccine  11/06/2026*  . Hemoglobin A1C  11/09/2016  . Eye exam for diabetics  11/14/2016  . Complete foot exam   08/04/2017  . Flu Shot  Addressed  . Shingles Vaccine  Completed  *Topic was postponed. The date shown is not the original due date.   Preventive Care for Adults  A healthy lifestyle and preventive care can promote health and wellness. Preventive health guidelines for adults include the following key practices.  . A routine yearly physical is a good way to check with your health care provider about your health and preventive screening. It is a chance to share any concerns and updates on your health and to receive a thorough exam.  . Visit your dentist for a routine exam and preventive care every 6 months. Brush your teeth twice a day and floss once a day. Good oral hygiene prevents tooth decay and gum disease.  . The frequency of eye exams is based on your age, health, family medical history, use  of contact lenses, and other factors. Follow your health care provider's ecommendations for frequency of eye exams.  . Eat a healthy diet. Foods like vegetables, fruits, whole grains, low-fat dairy products, and lean protein foods contain the nutrients you need without too many calories. Decrease your intake of foods high in solid fats, added sugars, and salt. Eat the  right amount of calories for you. Get information about a proper diet from your health care provider, if necessary.  . Regular physical exercise is one of the most important things you can do for your health. Most adults should get at least 150 minutes of moderate-intensity exercise (any activity that increases your heart rate and causes you to sweat) each week. In addition, most adults need muscle-strengthening exercises on 2 or more days a week.  Silver Sneakers may be a benefit available to you. To determine eligibility, you may visit the website: www.silversneakers.com or contact program at 763-098-4792 Mon-Fri between 8AM-8PM.   . Maintain a healthy weight. The body mass index (BMI) is a screening tool to identify possible weight problems. It provides an estimate of body fat based on height and weight. Your health care provider can find your BMI and can help you achieve or maintain a healthy weight.   For adults 20 years and older: ? A BMI below 18.5 is considered underweight. ? A BMI of 18.5 to 24.9 is normal. ? A BMI of 25 to 29.9 is considered overweight. ? A BMI of 30 and above is considered obese.   . Maintain normal blood lipids and cholesterol levels by exercising and minimizing your intake of saturated fat. Eat a balanced diet with plenty of fruit and vegetables. Blood tests for lipids and cholesterol should  begin at age 92 and be repeated every 5 years. If your lipid or cholesterol levels are high, you are over 50, or you are at high risk for heart disease, you may need your cholesterol levels checked more frequently. Ongoing high lipid and cholesterol levels should be treated with medicines if diet and exercise are not working.  . If you smoke, find out from your health care provider how to quit. If you do not use tobacco, please do not start.  . If you choose to drink alcohol, please do not consume more than 2 drinks per day. One drink is considered to be 12 ounces (355 mL) of  beer, 5 ounces (148 mL) of wine, or 1.5 ounces (44 mL) of liquor.  . If you are 20-75 years old, ask your health care provider if you should take aspirin to prevent strokes.  . Use sunscreen. Apply sunscreen liberally and repeatedly throughout the day. You should seek shade when your shadow is shorter than you. Protect yourself by wearing long sleeves, pants, a wide-brimmed hat, and sunglasses year round, whenever you are outdoors.  . Once a month, do a whole body skin exam, using a mirror to look at the skin on your back. Tell your health care provider of new moles, moles that have irregular borders, moles that are larger than a pencil eraser, or moles that have changed in shape or color.

## 2016-11-06 NOTE — Patient Instructions (Signed)
I sent refills for your medications to the pharmacy.  I adjusted the potassium so you don't need to cut these tablets in half. Take 1 full tablet by mouth three times weekly.  You were provided with a pneumonia vaccination today.  We will notify you once you're labs have returned.  You can take Miralax as needed for constipation. Make sure you mix this with 8 ounces of water.  Follow up in 6 months for re-evaluation.  It was a pleasure to see you today!

## 2016-11-06 NOTE — Assessment & Plan Note (Signed)
Managed on iron BID, does have constipation occasionally. Check CBC today, may consider reducing dose if possible. Discussed use of Miralax/Colace.

## 2016-11-06 NOTE — Progress Notes (Signed)
I reviewed health advisor's note, was available for consultation, and agree with documentation and plan.  

## 2016-11-06 NOTE — Progress Notes (Signed)
Pre visit review using our clinic review tool, if applicable. No additional management support is needed unless otherwise documented below in the visit note. 

## 2016-11-06 NOTE — Progress Notes (Signed)
PCP notes:   Health maintenance:  Tetanus - postponed/insurance PCV13 - if PCP determines necessary, pt will get vaccine at [facility] Flu vaccine - per pt, vaccine taken in Sept 2017 Foot exam - per pt, podiatrist is seen every 3-4 mths for foot care  Abnormal screenings:   Hearing- failed Mini-Cog score: 19/20  Patient concerns:   None  Nurse concerns:  None  Next PCP appt:   11/06/16 @ 1100

## 2016-11-06 NOTE — Progress Notes (Signed)
Subjective:    Patient ID: Sharon Frazier, female    DOB: 1922/05/15, 83 y.o.   MRN: [id number]  HPI  Sharon Frazier is a 83 year old female who presents today for [place] Part 2. She recently saw our health advisor this morning and had labs drawn.  1) Type 2 Diabetes: Previously managed on Glipizide XL 2.5 mg which was discontinued given episodes of hypoglycemia. She is due for repeat A1C today. She underwent foot examination through Podiatry in November 2017. She denies dizziness, numbness/tingling, weakness.  2) Essential Hypertension: Currently managed on diltiazem 120 mg, lisinopril/hctz, metoprolol 12.5 mg BID, furosemide (1 daily Monday through Friday). She denies chest pain, shortness of breath.   3) CHF: NYHA class II. Currently following with CHF clinic. Last evaluated in December 2017. Her lower extremity edema has improved since increasing lasix frequency.   4) Atrial Fibrillation: Managed on diltazem and aspirin. She denies chest pain, palpitations. She is currently following with cardiology.   Review of Systems  Constitutional: Negative for fatigue.  Eyes: Negative for visual disturbance.  Respiratory: Negative for shortness of breath.   Cardiovascular: Negative for chest pain and leg swelling.  Gastrointestinal: Positive for constipation. Negative for blood in stool.  Neurological: Negative for dizziness and numbness.       Past Medical History:  Diagnosis Date  . A-fib ([place])   . Breast cancer ([place])    right  . CHF (congestive heart failure) ([place])   . Diabetes mellitus without complication ([place])   . Diverticulosis   . GERD (gastroesophageal reflux disease)   . Glaucoma   . H/O: GI bleed   . History of colonic polyps   . HTN (hypertension)   . Hyperlipidemia   . Palpitations   . PUD (peptic ulcer disease)   . PVD (peripheral vascular disease) ([place])      Social History   Social History  . Marital status: Widowed    Spouse name: N/A  . Number of  children: N/A  . Years of education: N/A   Occupational History  . Not on file.   Social History Main Topics  . Smoking status: Former Smoker    Packs/day: 2.00    Years: 25.00    Types: Cigarettes    Quit date: 11/07/1987  . Smokeless tobacco: Never Used  . Alcohol use No  . Drug use: No  . Sexual activity: No   Other Topics Concern  . Not on file   Social History Narrative   Widow   Retired. Once worked as a Materials engineer.   Enjoys cross word puzzles.     Past Surgical History:  Procedure Laterality Date  . HIP SURGERY     left hip  . MASTECTOMY     right breast   . RENAL ARTERY STENT    . VAGINAL HYSTERECTOMY      Family History  Problem Relation Age of Onset  . Cancer Sister     Colon cancer    Allergies  Allergen Reactions  . Alphagan [Brimonidine]     Arm numbness      Current Outpatient Prescriptions on File Prior to Visit  Medication Sig Dispense Refill  . aspirin 81 MG tablet Take 81 mg by mouth daily.    Marland Kitchen diltiazem (CARDIZEM SR) 120 MG 12 hr capsule Take 120 mg by mouth daily.    . dorzolamide (TRUSOPT) 2 % ophthalmic solution 1 drop 2 (two) times daily.    . furosemide (LASIX) 40 MG  tablet Take 40 mg by mouth daily.     Marland Kitchen latanoprost (XALATAN) 0.005 % ophthalmic solution Place 1 drop into both eyes at bedtime.    Marland Kitchen lisinopril-hydrochlorothiazide (PRINZIDE,ZESTORETIC) 10-12.5 MG tablet Take 0.5 tablets by mouth daily. 45 tablet 3  . omeprazole (PRILOSEC) 20 MG capsule TAKE ONE (1) CAPSULE BY MOUTH 2 TIMES DAILY BEFORE MEALS 180 capsule 1  . POLY-IRON 150 150 MG capsule TAKE ONE (1) CAPSULE BY MOUTH 2 TIMES DAILY 60 capsule 5   No current facility-administered medications on file prior to visit.     BP 110/70 (BP Location: Left Arm, Patient Position: Sitting, Cuff Size: Small) Comment: AM meds not taken  Pulse 60   Temp 98.6 F (37 C) (Oral)   Ht 5' (1.524 m) Comment: shoes  Wt 91 lb 8 oz (41.5 kg)   LMP  (LMP Unknown)   SpO2 99%   BMI  17.87 kg/m    Objective:   Physical Exam  Constitutional: She appears well-nourished.  Neck: Neck supple.  Cardiovascular: Normal rate.   Irregular rhythm  Pulmonary/Chest: Effort normal and breath sounds normal. She has no wheezes. She has no rales.  Skin: Skin is warm and dry.  Psychiatric: She has a normal mood and affect.          Assessment & Plan:

## 2016-11-06 NOTE — Progress Notes (Signed)
Subjective:   Sharon Frazier is a 83 y.o. female who presents for an Initial Medicare Annual Wellness Visit.  Review of Systems    N/A  Cardiac Risk Factors include: advanced age (>57men, >46 women);diabetes mellitus;dyslipidemia;hypertension;sedentary lifestyle     Objective:    Today's Vitals   11/06/16 0957  BP: 110/70  Pulse: 60  Temp: 98.6 F (37 C)  TempSrc: Oral  SpO2: 99%  Weight: 91 lb 8 oz (41.5 kg)  Height: 5' (1.524 m)  PainSc: 0-No pain   Body mass index is 17.87 kg/m.   Current Medications (verified) Outpatient Encounter Prescriptions as of 11/06/2016  Medication Sig  . aspirin 81 MG tablet Take 81 mg by mouth daily.  Marland Kitchen diltiazem (CARDIZEM SR) 120 MG 12 hr capsule Take 120 mg by mouth daily.  . dorzolamide (TRUSOPT) 2 % ophthalmic solution 1 drop 2 (two) times daily.  . furosemide (LASIX) 40 MG tablet Take 40 mg by mouth daily.   Marland Kitchen latanoprost (XALATAN) 0.005 % ophthalmic solution Place 1 drop into both eyes at bedtime.  Marland Kitchen lisinopril-hydrochlorothiazide (PRINZIDE,ZESTORETIC) 10-12.5 MG tablet Take 0.5 tablets by mouth daily.  Marland Kitchen omeprazole (PRILOSEC) 20 MG capsule TAKE ONE (1) CAPSULE BY MOUTH 2 TIMES DAILY BEFORE MEALS  . POLY-IRON 150 150 MG capsule TAKE ONE (1) CAPSULE BY MOUTH 2 TIMES DAILY  . [DISCONTINUED] metoprolol tartrate (LOPRESSOR) 25 MG tablet Take 0.5 tablets (12.5 mg total) by mouth 2 (two) times daily.  . [DISCONTINUED] potassium chloride SA (K-DUR,KLOR-CON) 20 MEQ tablet Take 10 mEq by mouth. 1/2 tablet Mon- Fri  . [DISCONTINUED] metoprolol tartrate (LOPRESSOR) 25 MG tablet Take 12.5 mg by mouth 2 (two) times daily.   No facility-administered encounter medications on file as of 11/06/2016.     Allergies (verified) Alphagan [brimonidine]   History: Past Medical History:  Diagnosis Date  . A-fib ([place])   . Breast cancer ([place])    right  . CHF (congestive heart failure) ([place])   . Diabetes mellitus without complication ([place])   .  Diverticulosis   . GERD (gastroesophageal reflux disease)   . Glaucoma   . H/O: GI bleed   . History of colonic polyps   . HTN (hypertension)   . Hyperlipidemia   . Palpitations   . PUD (peptic ulcer disease)   . PVD (peripheral vascular disease) ([place])    Past Surgical History:  Procedure Laterality Date  . HIP SURGERY     left hip  . MASTECTOMY     right breast   . RENAL ARTERY STENT    . VAGINAL HYSTERECTOMY     Family History  Problem Relation Age of Onset  . Cancer Sister     Colon cancer   Social History   Occupational History  . Not on file.   Social History Main Topics  . Smoking status: Former Smoker    Packs/day: 2.00    Years: 25.00    Types: Cigarettes    Quit date: 11/07/1987  . Smokeless tobacco: Never Used  . Alcohol use No  . Drug use: No  . Sexual activity: No    Tobacco Counseling Counseling given: No   Activities of Daily Living In your present state of health, do you have any difficulty performing the following activities: 11/06/2016 11/06/2016  Hearing? Tempie Donning  Vision? N N  Difficulty concentrating or making decisions? N N  Walking or climbing stairs? Y Y  Dressing or bathing? N N  Doing errands, shopping? Tempie Donning  Preparing Food and eating ? - N  Using the Toilet? - N  In the past six months, have you accidently leaked urine? - N  Do you have problems with loss of bowel control? - Y  Managing your Medications? - Y  Managing your Finances? - Y  Housekeeping or managing your Housekeeping? - N  Some recent data might be hidden    Immunizations and Health Maintenance Immunization History  Administered Date(s) Administered  . Influenza-Unspecified 05/10/2015, 05/26/2016  . Pneumococcal Conjugate-13 11/06/2016  . Pneumococcal Polysaccharide-23 06/12/2012  . Zoster 09/09/2012   There are no preventive care reminders to display for this patient.  Patient Care Team: Pleas Koch, NP as PCP - General (Internal Medicine) Minna Merritts, MD as Consulting Physician (Cardiology) Edrick Kins, DPM as Consulting Physician (Podiatry) Leandrew Koyanagi, MD as Referring Physician (Ophthalmology)  Indicate any recent Medical Services you may have received from other than [facility] providers in the past year (date may be approximate).     Assessment:   This is a routine wellness examination for Sharon Frazier.   Hearing/Vision screen  Hearing Screening   125Hz  250Hz  500Hz  1000Hz  2000Hz  3000Hz  4000Hz  6000Hz  8000Hz   Right ear:   0 0 0  0    Left ear:   0 0 0  0    Vision Screening Comments: Last vision exam in Jan 2018  Dietary issues and exercise activities discussed: Current Exercise Habits: The patient does not participate in regular exercise at present, Exercise limited by: orthopedic condition(s)  Goals    . safety          Starting 11/06/2016, I will continue to use cane daily as needed to reduce risk of falls.       Depression Screen PHQ 2/9 Scores 11/06/2016 09/06/2016 05/30/2016 02/28/2016 08/31/2015 03/01/2015  PHQ - 2 Score 0 0 1 0 0 0    Fall Risk Fall Risk  11/06/2016 09/06/2016 05/30/2016 02/28/2016 08/31/2015  Falls in the past year? No No Yes No Yes  Number falls in past yr: - - 1 - 1  Injury with Fall? - - No - No  Risk for fall due to : - - History of fall(s) - History of fall(s);Impaired balance/gait  Follow up - - Education provided - Falls prevention discussed    Cognitive Function: MMSE - Mini Mental State Exam 11/06/2016  Orientation to time 5  Orientation to Place 5  Registration 3  Attention/ Calculation 0  Recall 3  Language- name 2 objects 0  Language- repeat 1  Language- follow 3 step command 2  Language- read & follow direction 0  Write a sentence 0  Copy design 0  Total score 19       PLEASE NOTE: A Mini-Cog screen was completed. Maximum score is 20. A value of 0 denotes this part of [place] MMSE was not completed or the patient failed this part of the Mini-Cog screening.   Mini-Cog  Screening Orientation to Time - Max 5 pts Orientation to Place - Max 5 pts Registration - Max 3 pts Recall - Max 3 pts Language Repeat - Max 1 pts Language Follow 3 Step Command - Max 3 pts   Screening Tests Health Maintenance  Topic Date Due  . PNA vac Low Risk Adult (2 of 2 - PCV13) 11/06/2016 (Originally 06/12/2013)  . DEXA SCAN  11/06/2025 (Originally 08/21/1989)  . TETANUS/TDAP  11/06/2026 (Originally [birth year]22/1941)  . HEMOGLOBIN A1C  11/09/2016  . OPHTHALMOLOGY EXAM  11/14/2016  .  FOOT EXAM  08/04/2017  . INFLUENZA VACCINE  Addressed  . ZOSTAVAX  Completed      Plan:     I have personally reviewed and addressed the Medicare Annual Wellness questionnaire and have noted the following in the patient's chart:  A. Medical and social history B. Use of alcohol, tobacco or illicit drugs  C. Current medications and supplements D. Functional ability and status E.  Nutritional status F.  Physical activity G. Advance directives H. List of other physicians I.  Hospitalizations, surgeries, and ER visits in previous 12 months J.  [place] to include hearing, vision, cognitive, depression L. Referrals and appointments - none  In addition, I have reviewed and discussed with patient certain preventive protocols, quality metrics, and best practice recommendations. A written personalized care plan for preventive services as well as general preventive health recommendations were provided to patient.  See attached scanned questionnaire for additional information.   Signed,   Lindell Noe, MHA, BS, LPN Health Coach

## 2016-11-06 NOTE — Assessment & Plan Note (Signed)
Regular rate, irregular rhythm. Continue CCB and aspirin.

## 2016-11-06 NOTE — Telephone Encounter (Signed)
Spoke with patients son who is agreeable to have her transfused through short stay at [facility]. Discussed strict ED precautions. She is asymptomatic and is not passing blood in her stools. Referral placed to hematology for follow up.  Mar[place], I need to get her set up with short stay for transfusion of 1 unit of red blood cells. Would like this done as soon as possible. How do we go about getting this done?

## 2016-11-06 NOTE — Telephone Encounter (Signed)
[facility] lab called critical lab results, HGB 6.4, HCT 21.1. Results given to Allie Bossier, NP

## 2016-11-06 NOTE — Assessment & Plan Note (Signed)
Stable on current regimen. Continue lopressor, lisinopril/hctz, diltiazem.

## 2016-11-06 NOTE — Assessment & Plan Note (Addendum)
Off meds since Fall 2017, check A1C today. Prevnar 13 provided today.

## 2016-11-06 NOTE — Addendum Note (Signed)
Addended by: Jacqualin Combes on: 11/06/2016 11:27 AM   Modules accepted: Orders

## 2016-11-06 NOTE — Assessment & Plan Note (Addendum)
No weight gain of >2 pounds in 24 hours, >5 pounds during 1 week. No lower extremity edema noted today. Discussed weight gain instructions and when to notify. Continue lasix Monday through Friday. Continue ACE and BB.

## 2016-11-06 NOTE — Assessment & Plan Note (Signed)
Check lipids today. Managed on Aspirin.

## 2016-11-07 ENCOUNTER — Other Ambulatory Visit: Payer: Self-pay | Admitting: *Deleted

## 2016-11-07 DIAGNOSIS — D649 Anemia, unspecified: Secondary | ICD-10-CM

## 2016-11-07 NOTE — Telephone Encounter (Signed)
Patient has been scheduled at [place] to see Dr Janese Banks 11/09/16 for labs, consult and transfusion. Patients son notified and aware.

## 2016-11-07 NOTE — Telephone Encounter (Signed)
Noted  

## 2016-11-09 ENCOUNTER — Inpatient Hospital Stay: Payer: Medicare Other

## 2016-11-09 ENCOUNTER — Encounter: Payer: Self-pay | Admitting: Oncology

## 2016-11-09 ENCOUNTER — Inpatient Hospital Stay: Payer: Medicare Other | Attending: Oncology | Admitting: Oncology

## 2016-11-09 VITALS — BP 121/50 | HR 58 | Temp 96.1°F | Resp 18 | Ht <= 58 in | Wt 90.7 lb

## 2016-11-09 DIAGNOSIS — Z87891 Personal history of nicotine dependence: Secondary | ICD-10-CM | POA: Insufficient documentation

## 2016-11-09 DIAGNOSIS — Z79899 Other long term (current) drug therapy: Secondary | ICD-10-CM | POA: Diagnosis not present

## 2016-11-09 DIAGNOSIS — I110 Hypertensive heart disease with heart failure: Secondary | ICD-10-CM | POA: Insufficient documentation

## 2016-11-09 DIAGNOSIS — D649 Anemia, unspecified: Secondary | ICD-10-CM

## 2016-11-09 DIAGNOSIS — R5381 Other malaise: Secondary | ICD-10-CM | POA: Insufficient documentation

## 2016-11-09 DIAGNOSIS — R531 Weakness: Secondary | ICD-10-CM | POA: Diagnosis not present

## 2016-11-09 DIAGNOSIS — E785 Hyperlipidemia, unspecified: Secondary | ICD-10-CM | POA: Insufficient documentation

## 2016-11-09 DIAGNOSIS — Z8711 Personal history of peptic ulcer disease: Secondary | ICD-10-CM | POA: Diagnosis not present

## 2016-11-09 DIAGNOSIS — E1151 Type 2 diabetes mellitus with diabetic peripheral angiopathy without gangrene: Secondary | ICD-10-CM | POA: Insufficient documentation

## 2016-11-09 DIAGNOSIS — Z9011 Acquired absence of right breast and nipple: Secondary | ICD-10-CM | POA: Insufficient documentation

## 2016-11-09 DIAGNOSIS — I4891 Unspecified atrial fibrillation: Secondary | ICD-10-CM | POA: Diagnosis not present

## 2016-11-09 DIAGNOSIS — Z8719 Personal history of other diseases of the digestive system: Secondary | ICD-10-CM | POA: Diagnosis not present

## 2016-11-09 DIAGNOSIS — R5383 Other fatigue: Secondary | ICD-10-CM | POA: Insufficient documentation

## 2016-11-09 DIAGNOSIS — Q2733 Arteriovenous malformation of digestive system vessel: Secondary | ICD-10-CM | POA: Diagnosis not present

## 2016-11-09 DIAGNOSIS — K219 Gastro-esophageal reflux disease without esophagitis: Secondary | ICD-10-CM | POA: Diagnosis not present

## 2016-11-09 DIAGNOSIS — D500 Iron deficiency anemia secondary to blood loss (chronic): Secondary | ICD-10-CM | POA: Diagnosis present

## 2016-11-09 DIAGNOSIS — I5032 Chronic diastolic (congestive) heart failure: Secondary | ICD-10-CM

## 2016-11-09 DIAGNOSIS — Z853 Personal history of malignant neoplasm of breast: Secondary | ICD-10-CM | POA: Diagnosis not present

## 2016-11-09 DIAGNOSIS — H409 Unspecified glaucoma: Secondary | ICD-10-CM | POA: Diagnosis not present

## 2016-11-09 DIAGNOSIS — Z7982 Long term (current) use of aspirin: Secondary | ICD-10-CM | POA: Diagnosis not present

## 2016-11-09 DIAGNOSIS — K648 Other hemorrhoids: Secondary | ICD-10-CM | POA: Diagnosis not present

## 2016-11-09 LAB — CBC WITH DIFFERENTIAL/PLATELET
BASOS ABS: 0.1 10*3/uL (ref 0–0.1)
Basophils Relative: 2 %
EOS PCT: 4 %
Eosinophils Absolute: 0.2 10*3/uL (ref 0–0.7)
HCT: 23.8 % — ABNORMAL LOW (ref 35.0–47.0)
Hemoglobin: 7.3 g/dL — ABNORMAL LOW (ref 12.0–16.0)
LYMPHS PCT: 25 %
Lymphs Abs: 1 10*3/uL (ref 1.0–3.6)
MCH: 23.1 pg — ABNORMAL LOW (ref 26.0–34.0)
MCHC: 30.5 g/dL — AB (ref 32.0–36.0)
MCV: 75.7 fL — AB (ref 80.0–100.0)
MONO ABS: 0.4 10*3/uL (ref 0.2–0.9)
MONOS PCT: 9 %
Neutro Abs: 2.5 10*3/uL (ref 1.4–6.5)
Neutrophils Relative %: 60 %
PLATELETS: 260 10*3/uL (ref 150–440)
RBC: 3.14 MIL/uL — ABNORMAL LOW (ref 3.80–5.20)
RDW: 19.7 % — AB (ref 11.5–14.5)
WBC: 4.2 10*3/uL (ref 3.6–11.0)

## 2016-11-09 LAB — RETICULOCYTES
RBC.: 3.2 MIL/uL — AB (ref 3.80–5.20)
RETIC COUNT ABSOLUTE: 76.8 10*3/uL (ref 19.0–183.0)
Retic Ct Pct: 2.4 % (ref 0.4–3.1)

## 2016-11-09 LAB — URINALYSIS, COMPLETE (UACMP) WITH MICROSCOPIC
Bacteria, UA: NONE SEEN
Bilirubin Urine: NEGATIVE
GLUCOSE, UA: NEGATIVE mg/dL
HGB URINE DIPSTICK: NEGATIVE
KETONES UR: NEGATIVE mg/dL
Leukocytes, UA: NEGATIVE
NITRITE: NEGATIVE
PH: 6 (ref 5.0–8.0)
PROTEIN: NEGATIVE mg/dL
Specific Gravity, Urine: 1.014 (ref 1.005–1.030)

## 2016-11-09 LAB — IRON AND TIBC
Iron: 16 ug/dL — ABNORMAL LOW (ref 28–170)
SATURATION RATIOS: 3 % — AB (ref 10.4–31.8)
TIBC: 499 ug/dL — ABNORMAL HIGH (ref 250–450)
UIBC: 483 ug/dL

## 2016-11-09 LAB — FOLATE: Folate: 12.7 ng/mL (ref >5.9)

## 2016-11-09 LAB — VITAMIN B12: Vitamin B-12: 253 pg/mL (ref 180–914)

## 2016-11-09 LAB — SAMPLE TO BLOOD BANK

## 2016-11-09 LAB — FERRITIN: Ferritin: 12 ng/mL (ref 11–307)

## 2016-11-09 LAB — ABO/RH: ABO/RH(D): O POS

## 2016-11-09 LAB — PREPARE RBC (CROSSMATCH)

## 2016-11-09 MED ORDER — ACETAMINOPHEN 325 MG PO TABS
650.0000 mg | ORAL_TABLET | Freq: Once | ORAL | Status: AC
  Administered 2016-11-09: 650 mg via ORAL
  Filled 2016-11-09: qty 2

## 2016-11-09 MED ORDER — SODIUM CHLORIDE 0.9 % IV SOLN
250.0000 mL | Freq: Once | INTRAVENOUS | Status: AC
  Administered 2016-11-09: 250 mL via INTRAVENOUS
  Filled 2016-11-09: qty 250

## 2016-11-09 NOTE — Progress Notes (Signed)
Referred by Dr Susette Racer evaluation of lab work.

## 2016-11-09 NOTE — Progress Notes (Signed)
Hematology/Oncology Consult note [facility] Telephone:([phone] [phone]  Patient Care Team: Pleas Koch, NP as PCP - General (Internal Medicine) Minna Merritts, MD as Consulting Physician (Cardiology) Edrick Kins, DPM as Consulting Physician (Podiatry) Leandrew Koyanagi, MD as Referring Physician (Ophthalmology)   Name of the patient: Sharon Frazier  VP:[id number]  16-Jun-1922    Reason for referral- iron deficiency anemia   Referring physician- Dr. Carlis Abbott  Date of visit: 11/09/16   History of presenting illness- patient is a 83 year old female who had recent CBC on 11/06/2016 which showed white count of 3.8, H&H of 6.4/21.1 with an MCV of 76.6 and a platelet count of 204. She has been referred to [place] for microcytic anemia. Patient feels fatigued but denies any symptoms of chest pain or exertional shortness of breath. She is independent of her ADLs but needs assistance with IADLs. Patient had a colonoscopy in 2007 and was found to have a nonbleeding hemorrhoids. She did have an EGD in 2014 which showed nonbleeding AVM which was treated by former treatment. Currently she reports any continued use of NSAIDs. She has been on oral iron for over a year and reports that her stools are black secondary to that. Denies any bright red blood in her stools.  ECOG PS-2  Pain scale- 0   Review of systems- Review of Systems  Constitutional: Positive for malaise/fatigue. Negative for chills, fever and weight loss.  HENT: Negative for congestion, ear discharge and nosebleeds.   Eyes: Negative for blurred vision.  Respiratory: Negative for cough, hemoptysis, sputum production, shortness of breath and wheezing.   Cardiovascular: Negative for chest pain, palpitations, orthopnea and claudication.  Gastrointestinal: Negative for abdominal pain, blood in stool, constipation, diarrhea, heartburn, melena, nausea and vomiting.  Genitourinary: Negative for  dysuria, flank pain, frequency, hematuria and urgency.  Musculoskeletal: Negative for back pain, joint pain and myalgias.  Skin: Negative for rash.  Neurological: Positive for weakness. Negative for dizziness, tingling, focal weakness, seizures and headaches.  Endo/Heme/Allergies: Does not bruise/bleed easily.  Psychiatric/Behavioral: Negative for depression and suicidal ideas. The patient does not have insomnia.     Allergies  Allergen Reactions  . Alphagan [Brimonidine]     Arm numbness      Patient Active Problem List   Diagnosis Date Noted  . Iron deficiency anemia due to chronic blood loss 11/09/2016  . Essential hypertension 03/08/2016  . Chronic diastolic CHF (congestive heart failure) ([place]) 06/19/2014  . A-fib ([place]) 12/25/2013  . Diabetes mellitus type 2, controlled ([place]) 12/25/2013  . Anemia 12/25/2013  . GI bleed 12/25/2013  . Hyperlipidemia 12/25/2013     Past Medical History:  Diagnosis Date  . A-fib ([place])   . Breast cancer ([place])    right  . CHF (congestive heart failure) ([place])   . Diabetes mellitus without complication ([place])   . Diverticulosis   . GERD (gastroesophageal reflux disease)   . Glaucoma   . H/O: GI bleed   . History of colonic polyps   . HTN (hypertension)   . Hyperlipidemia   . Palpitations   . PUD (peptic ulcer disease)   . PVD (peripheral vascular disease) ([place])      Past Surgical History:  Procedure Laterality Date  . HIP SURGERY     left hip  . MASTECTOMY     right breast   . RENAL ARTERY STENT    . VAGINAL HYSTERECTOMY      Social History   Social History  .  Marital status: Widowed    Spouse name: N/A  . Number of children: N/A  . Years of education: N/A   Occupational History  . Not on file.   Social History Main Topics  . Smoking status: Former Smoker    Packs/day: 2.00    Years: 25.00    Types: Cigarettes    Quit date: 11/07/1987  . Smokeless tobacco: Never Used  . Alcohol use No  . Drug use: No  . Sexual  activity: No   Other Topics Concern  . Not on file   Social History Narrative   Widow   Retired. Once worked as a Materials engineer.   Enjoys cross word puzzles.      Family History  Problem Relation Age of Onset  . Stroke Mother   . Aneurysm Father   . Cancer Sister     Colon cancer     Current Outpatient Prescriptions:  .  aspirin 81 MG tablet, Take 81 mg by mouth daily., Disp: , Rfl:  .  diltiazem (CARDIZEM SR) 120 MG 12 hr capsule, Take 120 mg by mouth daily., Disp: , Rfl:  .  dorzolamide (TRUSOPT) 2 % ophthalmic solution, 1 drop 2 (two) times daily., Disp: , Rfl:  .  furosemide (LASIX) 40 MG tablet, Take 40 mg by mouth daily. , Disp: , Rfl:  .  latanoprost (XALATAN) 0.005 % ophthalmic solution, Place 1 drop into both eyes at bedtime., Disp: , Rfl:  .  lisinopril-hydrochlorothiazide (PRINZIDE,ZESTORETIC) 10-12.5 MG tablet, Take 0.5 tablets by mouth daily., Disp: 45 tablet, Rfl: 3 .  metoprolol tartrate (LOPRESSOR) 25 MG tablet, Take 0.5 tablets (12.5 mg total) by mouth 2 (two) times daily., Disp: 45 tablet, Rfl: 3 .  omeprazole (PRILOSEC) 20 MG capsule, TAKE ONE (1) CAPSULE BY MOUTH 2 TIMES DAILY BEFORE MEALS, Disp: 180 capsule, Rfl: 1 .  POLY-IRON 150 150 MG capsule, TAKE ONE (1) CAPSULE BY MOUTH 2 TIMES DAILY, Disp: 60 capsule, Rfl: 5 .  potassium chloride (K-DUR,KLOR-CON) 10 MEQ tablet, Take 1 tablet by mouth Monday through Friday., Disp: 60 tablet, Rfl: 1   Physical exam:  Vitals:   11/09/16 0852  BP: (!) 121/50  Pulse: (!) 58  Resp: 18  Temp: (!) 96.1 F (35.6 C)  TempSrc: Tympanic  Weight: 90 lb 11.5 oz (41.2 kg)  Height: 4' 9.09" (1.45 m)   Physical Exam  Constitutional: She is oriented to person, place, and time and well-developed, well-nourished, and in no distress.  HENT:  Head: Normocephalic and atraumatic.  Eyes: EOM are normal. Pupils are equal, round, and reactive to light.  Neck: Normal range of motion.  Cardiovascular: Normal rate, regular rhythm and  normal heart sounds.   Pulmonary/Chest: Effort normal and breath sounds normal.  Abdominal: Soft. Bowel sounds are normal.  Musculoskeletal: She exhibits edema (b/l trace edema).  Neurological: She is alert and oriented to person, place, and time.  Skin: Skin is warm and dry.       CMP Latest Ref Rng & Units 11/06/2016  Glucose 70 - 99 mg/dL 112(H)  BUN 6 - 23 mg/dL 42(H)  Creatinine 0.40 - 1.20 mg/dL 1.87(H)  Sodium 135 - 145 mEq/L 139  Potassium 3.5 - 5.1 mEq/L 3.5  Chloride 96 - 112 mEq/L 107  CO2 19 - 32 mEq/L 25  Calcium 8.4 - 10.5 mg/dL 9.4  Total Protein 6.0 - 8.3 g/dL 6.6  Total Bilirubin 0.2 - 1.2 mg/dL 0.4  Alkaline Phos 39 - 117 U/L 98  AST  0 - 37 U/L 20  ALT 0 - 35 U/L 9   CBC Latest Ref Rng & Units 11/09/2016  WBC 3.6 - 11.0 K/uL 4.2  Hemoglobin 12.0 - 16.0 g/dL 7.3(L)  Hematocrit 35.0 - 47.0 % 23.8(L)  Platelets 150 - 440 K/uL 260    Assessment and plan- Patient is a 83 y.o. female who has been referred to [place] for microcytic iron deficiency anemia  1. repeat CBC in our office today shows H&H of 7.3/23.8. We have also ordered iron studies, B12 folate, reticulocyte count, haptoglobin which is currently pending. Given that she is significantly anemic with a proceed with 1 unit of blood transfusion today. I discussed the risks and benefits of blood transfusion including all but not limited to risk of transfusion reactions. Patient understands and agrees to proceed as planned. She has received blood transfusions in the past without any significant issues. We will also set her up to get 3 doses of ferriheme 510 mg starting next week weekly 3. I will also refer her to Dr. Vira Agar and she has seen him in the past for consideration of a repeat EGD. Given her age she is likely not a candidate for a repeat colonoscopy at this time. I will also obtain urinalysis to rule out microscopic hematuria and stool H. pylori antigen testing today. I will see her back in about 8 weeks time to  see how she has responded to IV iron. I explained to the patient and family that at this point given her age and the goal of IV iron and blood transfusion would be to maintain her quality of life and keep her hemoglobin around 10 which they understand and are agreeable    Thank you for this kind referral and the opportunity to participate in the care of this patient   Visit Diagnosis 1. Iron deficiency anemia due to chronic blood loss     Dr. Randa Evens, MD, MPH [facility] Pager- [phone] 11/09/2016 10:56 AM

## 2016-11-09 NOTE — Addendum Note (Signed)
Addended by: Luella Cook on: 11/09/2016 11:19 AM   Modules accepted: Orders

## 2016-11-10 ENCOUNTER — Ambulatory Visit (INDEPENDENT_AMBULATORY_CARE_PROVIDER_SITE_OTHER): Payer: Medicare Other | Admitting: Podiatry

## 2016-11-10 DIAGNOSIS — M79609 Pain in unspecified limb: Secondary | ICD-10-CM

## 2016-11-10 DIAGNOSIS — L608 Other nail disorders: Secondary | ICD-10-CM

## 2016-11-10 DIAGNOSIS — B351 Tinea unguium: Secondary | ICD-10-CM

## 2016-11-10 DIAGNOSIS — L603 Nail dystrophy: Secondary | ICD-10-CM | POA: Diagnosis not present

## 2016-11-10 DIAGNOSIS — E0843 Diabetes mellitus due to underlying condition with diabetic autonomic (poly)neuropathy: Secondary | ICD-10-CM | POA: Diagnosis not present

## 2016-11-10 LAB — TYPE AND SCREEN
BLOOD PRODUCT EXPIRATION DATE: 201802202359
ISSUE DATE / TIME: 201802151206
Unit Type and Rh: 9500

## 2016-11-10 LAB — HAPTOGLOBIN: HAPTOGLOBIN: 68 mg/dL (ref 34–200)

## 2016-11-10 NOTE — Progress Notes (Signed)
   SUBJECTIVE Patient with a history of diabetes mellitus presents to office today complaining of elongated, thickened nails. Pain while ambulating in shoes. Patient is unable to trim their own nails.   OBJECTIVE General Patient is awake, alert, and oriented x 3 and in no acute distress. Derm Skin is dry and supple bilateral. Negative open lesions or macerations. Remaining integument unremarkable. Nails are tender, long, thickened and dystrophic with subungual debris, consistent with onychomycosis, 1-5 bilateral. No signs of infection noted. Vasc  DP and PT pedal pulses palpable bilaterally. Temperature gradient within normal limits.  Neuro Epicritic and protective threshold sensation diminished bilaterally.  Musculoskeletal Exam No symptomatic pedal deformities noted bilateral. Muscular strength within normal limits.  ASSESSMENT 1. Diabetes Mellitus w/ peripheral neuropathy 2. Onychomycosis of nail due to dermatophyte bilateral 3. Pain in foot bilateral  PLAN OF CARE 1. Patient evaluated today. 2. Instructed to maintain good pedal hygiene and foot care. Stressed importance of controlling blood sugar.  3. Mechanical debridement of nails 1-5 bilaterally performed using a nail nipper. Filed with dremel without incident.  4. Return to clinic in 3 mos.     Brent M. Evans, DPM [facility]  Dr. Brent M. Evans, DPM    [address]                                        [place], [place] [address]                Office [phone]  Fax [phone]       

## 2016-11-16 ENCOUNTER — Inpatient Hospital Stay: Payer: Medicare Other

## 2016-11-16 VITALS — BP 131/57 | HR 63 | Temp 98.8°F | Resp 24

## 2016-11-16 DIAGNOSIS — D500 Iron deficiency anemia secondary to blood loss (chronic): Secondary | ICD-10-CM

## 2016-11-16 MED ORDER — SODIUM CHLORIDE 0.9 % IV SOLN
510.0000 mg | Freq: Once | INTRAVENOUS | Status: AC
  Administered 2016-11-16: 510 mg via INTRAVENOUS
  Filled 2016-11-16: qty 17

## 2016-11-16 MED ORDER — SODIUM CHLORIDE 0.9 % IV SOLN
Freq: Once | INTRAVENOUS | Status: AC
  Administered 2016-11-16: 14:00:00 via INTRAVENOUS
  Filled 2016-11-16: qty 1000

## 2016-11-20 ENCOUNTER — Other Ambulatory Visit: Payer: Self-pay | Admitting: Primary Care

## 2016-11-20 DIAGNOSIS — D509 Iron deficiency anemia, unspecified: Secondary | ICD-10-CM

## 2016-11-23 ENCOUNTER — Inpatient Hospital Stay: Payer: Medicare Other | Attending: Oncology

## 2016-11-23 VITALS — BP 116/73 | HR 54 | Temp 98.6°F | Resp 22

## 2016-11-23 DIAGNOSIS — Z8711 Personal history of peptic ulcer disease: Secondary | ICD-10-CM | POA: Diagnosis not present

## 2016-11-23 DIAGNOSIS — D500 Iron deficiency anemia secondary to blood loss (chronic): Secondary | ICD-10-CM | POA: Insufficient documentation

## 2016-11-23 DIAGNOSIS — Z8719 Personal history of other diseases of the digestive system: Secondary | ICD-10-CM | POA: Insufficient documentation

## 2016-11-23 MED ORDER — SODIUM CHLORIDE 0.9 % IV SOLN
Freq: Once | INTRAVENOUS | Status: AC
  Administered 2016-11-23: 13:00:00 via INTRAVENOUS
  Filled 2016-11-23: qty 1000

## 2016-11-23 MED ORDER — FERUMOXYTOL INJECTION 510 MG/17 ML
510.0000 mg | Freq: Once | INTRAVENOUS | Status: AC
  Administered 2016-11-23: 510 mg via INTRAVENOUS
  Filled 2016-11-23: qty 17

## 2016-11-24 ENCOUNTER — Inpatient Hospital Stay: Payer: Medicare Other

## 2016-12-04 ENCOUNTER — Ambulatory Visit (INDEPENDENT_AMBULATORY_CARE_PROVIDER_SITE_OTHER): Payer: Medicare Other | Admitting: Family Medicine

## 2016-12-04 ENCOUNTER — Inpatient Hospital Stay
Admission: EM | Admit: 2016-12-04 | Discharge: 2016-12-07 | DRG: 469 | Disposition: A | Discharge status: Skilled Nursing Facility | Payer: Medicare Other | Attending: Internal Medicine | Admitting: Internal Medicine

## 2016-12-04 ENCOUNTER — Ambulatory Visit
Admission: RE | Admit: 2016-12-04 | Discharge: 2016-12-04 | Disposition: A | Discharge status: Home / Self Care | Payer: Medicare Other | Source: Ambulatory Visit | Attending: Family Medicine | Admitting: Family Medicine

## 2016-12-04 ENCOUNTER — Emergency Department: Payer: Medicare Other

## 2016-12-04 ENCOUNTER — Encounter: Payer: Self-pay | Admitting: Emergency Medicine

## 2016-12-04 ENCOUNTER — Other Ambulatory Visit: Payer: Self-pay

## 2016-12-04 ENCOUNTER — Ambulatory Visit (INDEPENDENT_AMBULATORY_CARE_PROVIDER_SITE_OTHER)
Admission: RE | Admit: 2016-12-04 | Discharge: 2016-12-04 | Disposition: A | Discharge status: Home / Self Care | Payer: Medicare Other | Source: Ambulatory Visit | Attending: Family Medicine | Admitting: Family Medicine

## 2016-12-04 ENCOUNTER — Encounter: Payer: Self-pay | Admitting: Family Medicine

## 2016-12-04 VITALS — BP 98/64 | HR 79 | Temp 98.4°F

## 2016-12-04 DIAGNOSIS — E876 Hypokalemia: Secondary | ICD-10-CM | POA: Diagnosis present

## 2016-12-04 DIAGNOSIS — S72011A Unspecified intracapsular fracture of right femur, initial encounter for closed fracture: Secondary | ICD-10-CM | POA: Diagnosis present

## 2016-12-04 DIAGNOSIS — Z90710 Acquired absence of both cervix and uterus: Secondary | ICD-10-CM | POA: Diagnosis not present

## 2016-12-04 DIAGNOSIS — N183 Chronic kidney disease, stage 3 (moderate): Secondary | ICD-10-CM | POA: Diagnosis present

## 2016-12-04 DIAGNOSIS — R262 Difficulty in walking, not elsewhere classified: Secondary | ICD-10-CM | POA: Diagnosis not present

## 2016-12-04 DIAGNOSIS — I130 Hypertensive heart and chronic kidney disease with heart failure and stage 1 through stage 4 chronic kidney disease, or unspecified chronic kidney disease: Secondary | ICD-10-CM | POA: Diagnosis present

## 2016-12-04 DIAGNOSIS — Z01810 Encounter for preprocedural cardiovascular examination: Secondary | ICD-10-CM | POA: Diagnosis not present

## 2016-12-04 DIAGNOSIS — Z79899 Other long term (current) drug therapy: Secondary | ICD-10-CM

## 2016-12-04 DIAGNOSIS — S72001A Fracture of unspecified part of neck of right femur, initial encounter for closed fracture: Secondary | ICD-10-CM | POA: Diagnosis not present

## 2016-12-04 DIAGNOSIS — Z8673 Personal history of transient ischemic attack (TIA), and cerebral infarction without residual deficits: Secondary | ICD-10-CM

## 2016-12-04 DIAGNOSIS — H409 Unspecified glaucoma: Secondary | ICD-10-CM | POA: Diagnosis present

## 2016-12-04 DIAGNOSIS — R29898 Other symptoms and signs involving the musculoskeletal system: Secondary | ICD-10-CM

## 2016-12-04 DIAGNOSIS — Z800 Family history of malignant neoplasm of digestive organs: Secondary | ICD-10-CM

## 2016-12-04 DIAGNOSIS — E785 Hyperlipidemia, unspecified: Secondary | ICD-10-CM | POA: Diagnosis present

## 2016-12-04 DIAGNOSIS — E1151 Type 2 diabetes mellitus with diabetic peripheral angiopathy without gangrene: Secondary | ICD-10-CM | POA: Diagnosis present

## 2016-12-04 DIAGNOSIS — E43 Unspecified severe protein-calorie malnutrition: Secondary | ICD-10-CM | POA: Diagnosis present

## 2016-12-04 DIAGNOSIS — Z96649 Presence of unspecified artificial hip joint: Secondary | ICD-10-CM

## 2016-12-04 DIAGNOSIS — I482 Chronic atrial fibrillation: Secondary | ICD-10-CM | POA: Diagnosis not present

## 2016-12-04 DIAGNOSIS — D509 Iron deficiency anemia, unspecified: Secondary | ICD-10-CM | POA: Diagnosis present

## 2016-12-04 DIAGNOSIS — Z87891 Personal history of nicotine dependence: Secondary | ICD-10-CM

## 2016-12-04 DIAGNOSIS — Z9011 Acquired absence of right breast and nipple: Secondary | ICD-10-CM | POA: Diagnosis not present

## 2016-12-04 DIAGNOSIS — Z853 Personal history of malignant neoplasm of breast: Secondary | ICD-10-CM | POA: Diagnosis not present

## 2016-12-04 DIAGNOSIS — Z9221 Personal history of antineoplastic chemotherapy: Secondary | ICD-10-CM

## 2016-12-04 DIAGNOSIS — W19XXXA Unspecified fall, initial encounter: Secondary | ICD-10-CM | POA: Diagnosis present

## 2016-12-04 DIAGNOSIS — D638 Anemia in other chronic diseases classified elsewhere: Secondary | ICD-10-CM | POA: Diagnosis present

## 2016-12-04 DIAGNOSIS — K219 Gastro-esophageal reflux disease without esophagitis: Secondary | ICD-10-CM | POA: Diagnosis present

## 2016-12-04 DIAGNOSIS — Z681 Body mass index (BMI) 19 or less, adult: Secondary | ICD-10-CM

## 2016-12-04 DIAGNOSIS — Z823 Family history of stroke: Secondary | ICD-10-CM

## 2016-12-04 DIAGNOSIS — Z66 Do not resuscitate: Secondary | ICD-10-CM | POA: Diagnosis present

## 2016-12-04 DIAGNOSIS — R188 Other ascites: Secondary | ICD-10-CM | POA: Diagnosis not present

## 2016-12-04 DIAGNOSIS — E1122 Type 2 diabetes mellitus with diabetic chronic kidney disease: Secondary | ICD-10-CM | POA: Diagnosis present

## 2016-12-04 DIAGNOSIS — M25551 Pain in right hip: Secondary | ICD-10-CM

## 2016-12-04 DIAGNOSIS — I272 Pulmonary hypertension, unspecified: Secondary | ICD-10-CM | POA: Diagnosis not present

## 2016-12-04 DIAGNOSIS — I5032 Chronic diastolic (congestive) heart failure: Secondary | ICD-10-CM | POA: Diagnosis present

## 2016-12-04 DIAGNOSIS — I509 Heart failure, unspecified: Secondary | ICD-10-CM | POA: Diagnosis not present

## 2016-12-04 LAB — BASIC METABOLIC PANEL
ANION GAP: 10 (ref 5–15)
BUN: 46 mg/dL — ABNORMAL HIGH (ref 6–20)
CHLORIDE: 106 mmol/L (ref 101–111)
CO2: 22 mmol/L (ref 22–32)
Calcium: 9.5 mg/dL (ref 8.9–10.3)
Creatinine, Ser: 2.03 mg/dL — ABNORMAL HIGH (ref 0.44–1.00)
GFR calc Af Amer: 23 mL/min — ABNORMAL LOW (ref >60)
GFR calc non Af Amer: 20 mL/min — ABNORMAL LOW (ref >60)
Glucose, Bld: 143 mg/dL — ABNORMAL HIGH (ref 65–99)
POTASSIUM: 3.9 mmol/L (ref 3.5–5.1)
Sodium: 138 mmol/L (ref 135–145)

## 2016-12-04 LAB — CBC
HEMATOCRIT: 32.2 % — AB (ref 35.0–47.0)
HEMOGLOBIN: 10.2 g/dL — AB (ref 12.0–16.0)
MCH: 27 pg (ref 26.0–34.0)
MCHC: 31.8 g/dL — ABNORMAL LOW (ref 32.0–36.0)
MCV: 85 fL (ref 80.0–100.0)
Platelets: 171 10*3/uL (ref 150–440)
RBC: 3.79 MIL/uL — ABNORMAL LOW (ref 3.80–5.20)
RDW: 26 % — ABNORMAL HIGH (ref 11.5–14.5)
WBC: 6.9 10*3/uL (ref 3.6–11.0)

## 2016-12-04 LAB — GLUCOSE, CAPILLARY
GLUCOSE-CAPILLARY: 121 mg/dL — AB (ref 65–99)
Glucose-Capillary: 125 mg/dL — ABNORMAL HIGH (ref 65–99)

## 2016-12-04 LAB — MRSA PCR SCREENING: MRSA BY PCR: NEGATIVE

## 2016-12-04 MED ORDER — INSULIN ASPART 100 UNIT/ML ~~LOC~~ SOLN: 0.0000 [IU] | Freq: Every day | SUBCUTANEOUS | Status: DC

## 2016-12-04 MED ORDER — METOPROLOL TARTRATE 25 MG PO TABS
12.5000 mg | ORAL_TABLET | Freq: Two times a day (BID) | ORAL | Status: DC
  Administered 2016-12-04 – 2016-12-07 (×3): 12.5 mg via ORAL
  Filled 2016-12-04 (×4): qty 1

## 2016-12-04 MED ORDER — SODIUM CHLORIDE 0.9 % IV SOLN
INTRAVENOUS | Status: DC
  Administered 2016-12-04: 22:00:00 via INTRAVENOUS

## 2016-12-04 MED ORDER — LATANOPROST 0.005 % OP SOLN
1.0000 [drp] | Freq: Every day | OPHTHALMIC | Status: DC
  Administered 2016-12-04 – 2016-12-06 (×2): 1 [drp] via OPHTHALMIC
  Filled 2016-12-04: qty 2.5

## 2016-12-04 MED ORDER — ONDANSETRON HCL 4 MG/2ML IJ SOLN: 4.0000 mg | Freq: Four times a day (QID) | INTRAMUSCULAR | Status: DC | PRN

## 2016-12-04 MED ORDER — FENTANYL CITRATE (PF) 100 MCG/2ML IJ SOLN
50.0000 ug | Freq: Once | INTRAMUSCULAR | Status: AC
  Administered 2016-12-04: 50 ug via INTRAVENOUS
  Filled 2016-12-04: qty 2

## 2016-12-04 MED ORDER — DORZOLAMIDE HCL 2 % OP SOLN
1.0000 [drp] | Freq: Two times a day (BID) | OPHTHALMIC | Status: DC
  Administered 2016-12-04 – 2016-12-07 (×5): 1 [drp] via OPHTHALMIC
  Filled 2016-12-04: qty 10

## 2016-12-04 MED ORDER — ACETAMINOPHEN 650 MG RE SUPP: 650.0000 mg | Freq: Four times a day (QID) | RECTAL | Status: DC | PRN

## 2016-12-04 MED ORDER — CEFAZOLIN SODIUM-DEXTROSE 2-3 GM-% IV SOLR
2.0000 g | INTRAVENOUS | Status: AC
  Administered 2016-12-05: 2 g via INTRAVENOUS
  Filled 2016-12-04: qty 50

## 2016-12-04 MED ORDER — POLYSACCHARIDE IRON COMPLEX 150 MG PO CAPS
150.0000 mg | ORAL_CAPSULE | Freq: Every day | ORAL | Status: DC
  Administered 2016-12-05 – 2016-12-07 (×3): 150 mg via ORAL
  Filled 2016-12-04 (×4): qty 1

## 2016-12-04 MED ORDER — POTASSIUM CHLORIDE CRYS ER 10 MEQ PO TBCR: 10.0000 meq | EXTENDED_RELEASE_TABLET | Freq: Every day | ORAL | Status: DC

## 2016-12-04 MED ORDER — DOCUSATE SODIUM 100 MG PO CAPS
100.0000 mg | ORAL_CAPSULE | Freq: Two times a day (BID) | ORAL | Status: DC
  Administered 2016-12-04 – 2016-12-05 (×2): 100 mg via ORAL
  Filled 2016-12-04 (×2): qty 1

## 2016-12-04 MED ORDER — INSULIN ASPART 100 UNIT/ML ~~LOC~~ SOLN: 0.0000 [IU] | Freq: Three times a day (TID) | SUBCUTANEOUS | Status: DC

## 2016-12-04 MED ORDER — ONDANSETRON HCL 4 MG PO TABS: 4.0000 mg | ORAL_TABLET | Freq: Four times a day (QID) | ORAL | Status: DC | PRN

## 2016-12-04 MED ORDER — ONDANSETRON HCL 4 MG/2ML IJ SOLN
4.0000 mg | Freq: Once | INTRAMUSCULAR | Status: AC
  Administered 2016-12-04: 4 mg via INTRAVENOUS

## 2016-12-04 MED ORDER — PANTOPRAZOLE SODIUM 40 MG PO TBEC
40.0000 mg | DELAYED_RELEASE_TABLET | Freq: Every day | ORAL | Status: DC
  Administered 2016-12-05: 40 mg via ORAL
  Filled 2016-12-04: qty 1

## 2016-12-04 MED ORDER — ACETAMINOPHEN 325 MG PO TABS: 650.0000 mg | ORAL_TABLET | Freq: Four times a day (QID) | ORAL | Status: DC | PRN

## 2016-12-04 MED ORDER — CEFAZOLIN SODIUM-DEXTROSE 2-4 GM/100ML-% IV SOLN: 2.0000 g | INTRAVENOUS | Status: DC

## 2016-12-04 MED ORDER — FUROSEMIDE 40 MG PO TABS
40.0000 mg | ORAL_TABLET | Freq: Every day | ORAL | Status: DC
  Administered 2016-12-05 – 2016-12-07 (×2): 40 mg via ORAL
  Filled 2016-12-04 (×3): qty 1

## 2016-12-04 MED ORDER — DILTIAZEM HCL ER 120 MG PO CP24
120.0000 mg | ORAL_CAPSULE | Freq: Every day | ORAL | Status: DC
  Administered 2016-12-05 – 2016-12-07 (×3): 120 mg via ORAL
  Filled 2016-12-04 (×6): qty 1

## 2016-12-04 MED ORDER — ONDANSETRON HCL 4 MG/2ML IJ SOLN
INTRAMUSCULAR | Status: AC
  Administered 2016-12-04: 4 mg via INTRAVENOUS
  Filled 2016-12-04: qty 2

## 2016-12-04 MED ORDER — TRAMADOL HCL 50 MG PO TABS
50.0000 mg | ORAL_TABLET | Freq: Four times a day (QID) | ORAL | Status: DC | PRN
  Administered 2016-12-05 – 2016-12-06 (×2): 50 mg via ORAL
  Filled 2016-12-04 (×2): qty 1

## 2016-12-04 MED ORDER — MORPHINE SULFATE (PF) 2 MG/ML IV SOLN
2.0000 mg | INTRAVENOUS | Status: DC | PRN
  Administered 2016-12-05: 2 mg via INTRAVENOUS
  Filled 2016-12-04: qty 1

## 2016-12-04 NOTE — H&P (Signed)
[place] [facility] [place] NAME: Sharon Frazier    MR#:  [id number]  DATE OF BIRTH:  1922/09/18  DATE OF ADMISSION:  12/04/2016  PRIMARY CARE PHYSICIAN: Sheral Flow, NP   REQUESTING/REFERRING PHYSICIAN: Harvest Dark, MD  CHIEF COMPLAINT:  Hip pain  HISTORY OF PRESENT ILLNESS:  Sharon Frazier  is a 83 y.o. female with a known history of Chronic atrial fibrillation, on aspirin, diabetes mellitus, hypertension and hyperlipidemia is presenting to the ED with a chief complaint of worsening of right hip pain for the past 3 days. Denies any fall or trauma. Patient was unable to ambulate as the pain was so severe x-ray in the emergency department has revealed right subcapital femoral fracture. Call placed to orthopedics Dr. Marry Guan. Patient is resting of her ability during my examination. Daughter-in-law at bedside  PAST MEDICAL HISTORY:   Past Medical History:  Diagnosis Date  . A-fib ([place])   . Breast cancer ([place])    right  . CHF (congestive heart failure) ([place])   . Diabetes mellitus without complication ([place])   . Diverticulosis   . GERD (gastroesophageal reflux disease)   . Glaucoma   . H/O: GI bleed   . History of colonic polyps   . HTN (hypertension)   . Hyperlipidemia   . Palpitations   . PUD (peptic ulcer disease)   . PVD (peripheral vascular disease) ([place])     PAST SURGICAL HISTOIRY:   Past Surgical History:  Procedure Laterality Date  . HIP SURGERY     left hip  . MASTECTOMY     right breast   . RENAL ARTERY STENT    . VAGINAL HYSTERECTOMY      SOCIAL HISTORY:   Social History  Substance Use Topics  . Smoking status: Former Smoker    Packs/day: 2.00    Years: 25.00    Types: Cigarettes    Quit date: 11/07/1987  . Smokeless tobacco: Never Used  . Alcohol use No    FAMILY HISTORY:   Family History  Problem Relation Age of Onset  . Stroke Mother   . Aneurysm Father   . Cancer Sister      Colon cancer    DRUG ALLERGIES:   Allergies  Allergen Reactions  . Alphagan [Brimonidine]     Arm numbness      REVIEW OF SYSTEMS:  CONSTITUTIONAL: No fever, fatigue or weakness.  EYES: No blurred or double vision.  EARS, NOSE, AND THROAT: No tinnitus or ear pain.  RESPIRATORY: No cough, shortness of breath, wheezing or hemoptysis.  CARDIOVASCULAR: No chest pain, orthopnea, edema.  GASTROINTESTINAL: No nausea, vomiting, diarrhea or abdominal pain.  GENITOURINARY: No dysuria, hematuria.  ENDOCRINE: No polyuria, nocturia,  HEMATOLOGY: No anemia, easy bruising or bleeding SKIN: No rash or lesion. MUSCULOSKELETAL: Reporting right hip pain  NEUROLOGIC: No tingling, numbness, weakness.  PSYCHIATRY: No anxiety or depression.   MEDICATIONS AT HOME:   Prior to Admission medications   Medication Sig Start Date End Date Taking? Authorizing Provider  aspirin 81 MG tablet Take 81 mg by mouth daily.   Yes Historical Provider, MD  diltiazem (CARDIZEM SR) 120 MG 12 hr capsule Take 120 mg by mouth daily.   Yes Historical Provider, MD  dorzolamide (TRUSOPT) 2 % ophthalmic solution 1 drop 2 (two) times daily.   Yes Historical Provider, MD  furosemide (LASIX) 40 MG tablet Take 40 mg by mouth daily.    Yes Historical Provider, MD  latanoprost Ivin Poot)  0.005 % ophthalmic solution Place 1 drop into both eyes at bedtime.   Yes Historical Provider, MD  lisinopril-hydrochlorothiazide (PRINZIDE,ZESTORETIC) 10-12.5 MG tablet Take 0.5 tablets by mouth daily. 07/31/16  Yes Pleas Koch, NP  metoprolol tartrate (LOPRESSOR) 25 MG tablet Take 0.5 tablets (12.5 mg total) by mouth 2 (two) times daily. 11/06/16  Yes Pleas Koch, NP  omeprazole (PRILOSEC) 20 MG capsule TAKE ONE (1) CAPSULE BY MOUTH 2 TIMES DAILY BEFORE MEALS 10/16/16  Yes Pleas Koch, NP  POLY-IRON 150 150 MG capsule TAKE ONE (1) CAPSULE BY MOUTH 2 TIMES DAILY 11/20/16  Yes Pleas Koch, NP  potassium chloride (K-DUR,KLOR-CON)  10 MEQ tablet Take 1 tablet by mouth Monday through Friday. 11/06/16  Yes Pleas Koch, NP      VITAL SIGNS:  Blood pressure 132/76, pulse 97, temperature 97.4 F (36.3 C), temperature source Oral, resp. rate 20, height 5' (1.524 m), weight 40.8 kg (90 lb), SpO2 100 %.  PHYSICAL EXAMINATION:  GENERAL:  83 y.o.-year-old patient lying in the bed with no acute distress.  EYES: Pupils equal, round, reactive to light and accommodation. No scleral icterus. Extraocular muscles intact.  HEENT: Head atraumatic, normocephalic. Oropharynx and nasopharynx clear.  NECK:  Supple, no jugular venous distention. No thyroid enlargement, no tenderness.  LUNGS: Normal breath sounds bilaterally, no wheezing, rales,rhonchi or crepitation. No use of accessory muscles of respiration.  CARDIOVASCULAR: S1, S2 normal. No murmurs, rubs, or gallops.  ABDOMEN: Soft, nontender, nondistended. Bowel sounds present. No organomegaly or mass.  EXTREMITIES: Right hip is tender, internally rotated No pedal edema, cyanosis, or clubbing.  NEUROLOGIC: Cranial nerves II through XII are intact. Muscle strength 5/5 in all extremities. Sensation intact. Gait not checked.  PSYCHIATRIC: The patient is alert and oriented x 3.  SKIN: No obvious rash, lesion, or ulcer.   LABORATORY PANEL:   CBC  Recent Labs Lab 12/04/16 1410  WBC 6.9  HGB 10.2*  HCT 32.2*  PLT 171   ------------------------------------------------------------------------------------------------------------------  Chemistries   Recent Labs Lab 12/04/16 1410  NA 138  K 3.9  CL 106  CO2 22  GLUCOSE 143*  BUN 46*  CREATININE 2.03*  CALCIUM 9.5   ------------------------------------------------------------------------------------------------------------------  Cardiac Enzymes No results for input(s): TROPONINI in the last 168  hours. ------------------------------------------------------------------------------------------------------------------  RADIOLOGY:  Dg Chest 1 View  Result Date: 12/04/2016 CLINICAL DATA:  Right hip fracture.  Ex-smoker. EXAM: CHEST 1 VIEW COMPARISON:  04/23/2014. FINDINGS: Grossly stable enlarged cardiac silhouette. Small to moderate amount of left lateral pleural fluid or thickening, significantly decreased. Mildly prominent interstitial markings with improvement. Thoracic aortic calcifications. Diffuse osteopenia. IMPRESSION: 1. Decreased left pleural thickening or fluid. 2. Stable cardiomegaly. 3. Aortic atherosclerosis. Electronically Signed   By: Claudie Revering M.D.   On: 12/04/2016 14:59   Dg Lumbar Spine 2-3 Views  Result Date: 12/04/2016 CLINICAL DATA:  Right hip pain.  Difficulty walking. EXAM: LUMBAR SPINE - 2-3 VIEW COMPARISON:  None FINDINGS: Degenerative disc disease in the upper and mid lumbar spine. Degenerative set disease in the lower lumbar spine. Slight anterolisthesis of L5 on S1. No fracture. Diffuse osteopenia. Diffuse aortic and iliac calcifications. Bilateral common iliac stents noted. IMPRESSION: Degenerative disc and facet disease. Osteopenia. No acute findings. Electronically Signed   By: Rolm Baptise M.D.   On: 12/04/2016 12:23   Dg Hip Unilat W Or Wo Pelvis 2-3 Views Right  Result Date: 12/04/2016 CLINICAL DATA:  Known right hip fracture. EXAM: DG HIP (WITH OR WITHOUT PELVIS)  2-3V RIGHT COMPARISON:  None. FINDINGS: There is a subcapital right hip fracture without dislocation. IMPRESSION: Subcapital right hip fracture without dislocation. Electronically Signed   By: Dorise Bullion III M.D   On: 12/04/2016 14:58   Dg Hip Unilat W Or W/o Pelvis 2-3 Views Right  Result Date: 12/04/2016 CLINICAL DATA:  Right hip pain.  Difficulty walking. EXAM: DG HIP (WITH OR WITHOUT PELVIS) 2-3V RIGHT COMPARISON:  None. FINDINGS: There is a right femoral neck fracture with mild varus  angulation. Mild degenerative changes in the hips bilaterally. Hardware noted in the left proximal femur. IMPRESSION: Right femoral neck fracture with varus angulation. Electronically Signed   By: Rolm Baptise M.D.   On: 12/04/2016 12:22    EKG:   Orders placed or performed during the hospital encounter of 12/04/16  . EKG 12-Lead  . EKG 12-Lead    IMPRESSION AND PLAN:  Sharon Frazier  is a 83 y.o. female with a known history of Chronic atrial fibrillation, on aspirin, diabetes mellitus, hypertension and hyperlipidemia is presenting to the ED with a chief complaint of worsening of right hip pain for the past 3 days. Denies any fall or trauma. Patient was unable to ambulate as the pain was so severe x-ray in the emergency department has revealed right subcapital femoral fracture. Call placed to orthopedics Dr. Marry Guan.  #Right hip pain from right subcapital hip fracture Admit to MedSurg unit Pain management as needed Hold patient's home medication aspirin Ortho consult is placed to Dr. Marry Guan Cardiac consult is placed to [place] for preop clearance given her history of chronic atrial fibrillation and congestive heart failure Echocardiogram ordered Chest x-ray negative  #Chronic atrial fibrillation rate controlled Hold off on the aspirin while ortho consult is pending Continue home medication metoprolol, Cardizem  #Chronic diastolic congestive heart failure Continue home medication Lasix Echo Cardizem ordered  #Chronic hypertension Continue home medication Cardizem, metoprolol Holding lisinopril hydrochlorothiazide as patient's creatinine is at 2.03, baseline seems to be at 1.8  DVT prophylaxis with SCDs  All the records are reviewed and case discussed with ED provider. Management plans discussed with the patient, family and they are in agreement.  CODE STATUS: DO NOT RESUSCITATE, son is the healthcare power of attorney  TOTAL TIME TAKING CARE OF THIS PATIENT: 43 minutes.    Note: This dictation was prepared with Dragon dictation along with smaller phrase technology. Any transcriptional errors that result from this process are unintentional.  Nicholes Mango M.D on 12/04/2016 at 4:01 PM  Between 7am to 6pm - Pager - [phone]  After 6pm go to [email or web address] - password [email or web address] [email or web address]  [facility]  Office  [phone]  CC: Primary care physician; Sheral Flow, NP

## 2016-12-04 NOTE — ED Triage Notes (Signed)
Pt in via EMS from [place] PCP with known right hip fracture.  Pt reports right hip pain x approximately 3 days, denies fall, denies recent injury.  Shortening and rotation evident upon assessment.  Pt vitals WDL, NAD noted at this time.

## 2016-12-04 NOTE — Progress Notes (Signed)
  Family Meeting Note  Advance Directive:yes  Today a meeting took place with the Patient and daughter in law bed side  The following clinical team members were present during this meeting:MD  The following were discussed:Patient's diagnosis: rt subcapital fracture  Patient's progosis: Unable to determine and Goals for treatment: DNR, son Mr. [name] is the healthcare power of attorney  Additional follow-up to be provided: Hospitalist cardiology,ortho  Time spent during discussion:18 min   Sharon Frazier, [name], MD

## 2016-12-04 NOTE — Consult Note (Signed)
ORTHOPAEDIC CONSULTATION  PATIENT NAME: Sharon Frazier DOB: 1921-12-31  MRN: [id number]  REQUESTING PHYSICIAN: Nicholes Mango, MD  Chief Complaint: Right hip pain  HPI: Sharon Frazier is a 83 y.o. female who complains of  a 3 day history of progressive right hip and groin pain. She denies any fall or trauma to the hip. She lives with her son. She typically ambulates with a walker. The pain had increased to the point where she was unable stand or bear weight due to the right hip pain. She denied any back pain or radicular pain.  Past Medical History:  Diagnosis Date  . A-fib ([place])   . Breast cancer ([place])    right  . CHF (congestive heart failure) ([place])   . Diabetes mellitus without complication ([place])   . Diverticulosis   . GERD (gastroesophageal reflux disease)   . Glaucoma   . H/O: GI bleed   . History of colonic polyps   . HTN (hypertension)   . Hyperlipidemia   . Palpitations   . PUD (peptic ulcer disease)   . PVD (peripheral vascular disease) ([place])    Past Surgical History:  Procedure Laterality Date  . MASTECTOMY     right breast   . Open reduction and internal fixation of a left intertrochanteric femur fracture  2007   Dr. Leanor Kail  . RENAL ARTERY STENT    . VAGINAL HYSTERECTOMY     Social History   Social History  . Marital status: Widowed    Spouse name: N/A  . Number of children: N/A  . Years of education: N/A   Social History Main Topics  . Smoking status: Former Smoker    Packs/day: 2.00    Years: 25.00    Types: Cigarettes    Quit date: 11/07/1987  . Smokeless tobacco: Never Used  . Alcohol use No  . Drug use: No  . Sexual activity: No   Other Topics Concern  . None   Social History Narrative   Widow   Retired. Once worked as a Materials engineer.   Enjoys cross word puzzles.    Family History  Problem Relation Age of Onset  . Stroke Mother   . Aneurysm Father   . Cancer Sister     Colon cancer   Allergies  Allergen Reactions  .  Alphagan [Brimonidine]     Arm numbness     Prior to Admission medications   Medication Sig Start Date End Date Taking? Authorizing Provider  aspirin 81 MG tablet Take 81 mg by mouth daily.   Yes Historical Provider, MD  diltiazem (CARDIZEM SR) 120 MG 12 hr capsule Take 120 mg by mouth daily.   Yes Historical Provider, MD  dorzolamide (TRUSOPT) 2 % ophthalmic solution 1 drop 2 (two) times daily.   Yes Historical Provider, MD  furosemide (LASIX) 40 MG tablet Take 40 mg by mouth daily.    Yes Historical Provider, MD  latanoprost (XALATAN) 0.005 % ophthalmic solution Place 1 drop into both eyes at bedtime.   Yes Historical Provider, MD  lisinopril-hydrochlorothiazide (PRINZIDE,ZESTORETIC) 10-12.5 MG tablet Take 0.5 tablets by mouth daily. 07/31/16  Yes Pleas Koch, NP  metoprolol tartrate (LOPRESSOR) 25 MG tablet Take 0.5 tablets (12.5 mg total) by mouth 2 (two) times daily. 11/06/16  Yes Pleas Koch, NP  omeprazole (PRILOSEC) 20 MG capsule TAKE ONE (1) CAPSULE BY MOUTH 2 TIMES DAILY BEFORE MEALS 10/16/16  Yes Pleas Koch, NP  POLY-IRON 150 150 MG capsule TAKE  ONE (1) CAPSULE BY MOUTH 2 TIMES DAILY 11/20/16  Yes Pleas Koch, NP  potassium chloride (K-DUR,KLOR-CON) 10 MEQ tablet Take 1 tablet by mouth Monday through Friday. 11/06/16  Yes Pleas Koch, NP   Dg Chest 1 View  Result Date: 12/04/2016 CLINICAL DATA:  Right hip fracture.  Ex-smoker. EXAM: CHEST 1 VIEW COMPARISON:  04/23/2014. FINDINGS: Grossly stable enlarged cardiac silhouette. Small to moderate amount of left lateral pleural fluid or thickening, significantly decreased. Mildly prominent interstitial markings with improvement. Thoracic aortic calcifications. Diffuse osteopenia. IMPRESSION: 1. Decreased left pleural thickening or fluid. 2. Stable cardiomegaly. 3. Aortic atherosclerosis. Electronically Signed   By: Claudie Revering M.D.   On: 12/04/2016 14:59   Dg Lumbar Spine 2-3 Views  Result Date:  12/04/2016 CLINICAL DATA:  Right hip pain.  Difficulty walking. EXAM: LUMBAR SPINE - 2-3 VIEW COMPARISON:  None FINDINGS: Degenerative disc disease in the upper and mid lumbar spine. Degenerative set disease in the lower lumbar spine. Slight anterolisthesis of L5 on S1. No fracture. Diffuse osteopenia. Diffuse aortic and iliac calcifications. Bilateral common iliac stents noted. IMPRESSION: Degenerative disc and facet disease. Osteopenia. No acute findings. Electronically Signed   By: Rolm Baptise M.D.   On: 12/04/2016 12:23   Dg Hip Unilat W Or Wo Pelvis 2-3 Views Right  Result Date: 12/04/2016 CLINICAL DATA:  Known right hip fracture. EXAM: DG HIP (WITH OR WITHOUT PELVIS) 2-3V RIGHT COMPARISON:  None. FINDINGS: There is a subcapital right hip fracture without dislocation. IMPRESSION: Subcapital right hip fracture without dislocation. Electronically Signed   By: Dorise Bullion III M.D   On: 12/04/2016 14:58   Dg Hip Unilat W Or W/o Pelvis 2-3 Views Right  Result Date: 12/04/2016 CLINICAL DATA:  Right hip pain.  Difficulty walking. EXAM: DG HIP (WITH OR WITHOUT PELVIS) 2-3V RIGHT COMPARISON:  None. FINDINGS: There is a right femoral neck fracture with mild varus angulation. Mild degenerative changes in the hips bilaterally. Hardware noted in the left proximal femur. IMPRESSION: Right femoral neck fracture with varus angulation. Electronically Signed   By: Rolm Baptise M.D.   On: 12/04/2016 12:22    Positive ROS: All other systems have been reviewed and were otherwise negative with the exception of those mentioned in the HPI and as above.  Physical Exam: General: Frail appearing female in no acute distress. HEENT: Atraumatic and normocephalic. Sclera are clear. Extraocular motion is intact. Oropharynx is clear with moist mucosa. Neck: Supple, nontender, good range of motion. No JVD or carotid bruits. Lungs: Clear to auscultation bilaterally. Cardiovascular: Irregular rate and rhythm with normal S1  and S2. No murmurs. No gallops or rubs. Pedal pulses are faint but palpable bilaterally. Homans test is negative bilaterally. No significant pretibial or ankle edema. Abdomen: Soft, nontender, and nondistended. Bowel sounds are present. Skin: No lesions in the area of chief complaint Neurologic: Awake, alert, and oriented. Sensory function is grossly intact. Motor strength is felt to be 5 over 5 with the exception of the right lower extremity which was not assessed due to the injury. No clonus or tremor. Good motor coordination. Lymphatic: No axillary or cervical lymphadenopathy  MUSCULOSKELETAL: Pertinent examination involves the right lower extremity. The right lower extremity shortened and externally rotated. Pain is elicited with any attempted range of motion of the right hip. No tenderness to palpation about the knee or ankle. No knee effusion.  Radiographs: AP pelvis, AP and lateral radiographs of the right hip from [facility] dated today were  reviewed. There is a displaced right femoral neck fracture. Diffuse osteopenia is appreciated. Of note is a hip screw to the left hip consistent with the patient's previous history of open reduction internal fixation of a left intertrochanteric femur fracture.  Assessment: Displaced right femoral neck fracture  Plan: The findings were discussed in detail with the patient and her son. Recommendations were made for a right hip hemiarthroplasty. The usual perioperative course was discussed. The risks and benefits of surgical intervention were reviewed. The patient and her son expressed understanding of the risks and benefits and agreed with plans for surgical intervention.   A consult had been placed by the hospitalist for preoperative evaluation and clearance by Cardiology. An echocardiogram is pending.  Eliot Bencivenga P. Holley Bouche M.D.

## 2016-12-04 NOTE — Plan of Care (Signed)
Problem: Education: Goal: Verbalization of understanding the information provided (i.e., activity precautions, restrictions, etc) will improve Outcome: Progressing Discussed with pt expected plan of care and things to expect pertaining to activity, pain management, diet, and discharge planning. She verbalized understanding   Problem: Pain Management: Goal: Pain level will decrease Outcome: Progressing Educated on appropriate pain goals and pain management regimine, and need to notify staff of pain level

## 2016-12-04 NOTE — Progress Notes (Signed)
Pre visit review using our clinic review tool, if applicable. No additional management support is needed unless otherwise documented below in the visit note. 

## 2016-12-04 NOTE — ED Provider Notes (Signed)
[facility] Emergency Department Provider Note  Time seen: 2:27 PM  I have reviewed the triage vital signs and the nursing notes.   HISTORY  Chief Complaint Hip Injury    HPI Sharon Frazier is a 83 y.o. female with a past medical history of atrial fibrillation, CHF, diabetes, gastric reflux, hypertension, hyperlipidemia, presents to the emergency department with right hip pain. According to the patient and family with whom she lives she has been complaining of right hip pain for approximately 3 days denies any fall or trauma. She states today the pain became severe and she is unable to ambulate so she went to her primary care doctor where she had x-rays which per report were consistent with fracture and the patient was sent to the emergency department. Patient's only complaint is moderate right hip pain, states minimal discomfort as long as it is not moving but severe pain with attempted movement of the right hip. Patient takes an 81 mg aspirin daily.  Past Medical History:  Diagnosis Date  . A-fib ([place])   . Breast cancer ([place])    right  . CHF (congestive heart failure) ([place])   . Diabetes mellitus without complication ([place])   . Diverticulosis   . GERD (gastroesophageal reflux disease)   . Glaucoma   . H/O: GI bleed   . History of colonic polyps   . HTN (hypertension)   . Hyperlipidemia   . Palpitations   . PUD (peptic ulcer disease)   . PVD (peripheral vascular disease) [facility])     Patient Active Problem List   Diagnosis Date Noted  . Iron deficiency anemia due to chronic blood loss 11/09/2016  . Essential hypertension 03/08/2016  . Chronic diastolic CHF (congestive heart failure) ([place]) 06/19/2014  . A-fib ([place]) 12/25/2013  . Diabetes mellitus type 2, controlled ([place]) 12/25/2013  . Anemia 12/25/2013  . GI bleed 12/25/2013  . Hyperlipidemia 12/25/2013    Past Surgical History:  Procedure Laterality Date  . HIP SURGERY     left hip  .  MASTECTOMY     right breast   . RENAL ARTERY STENT    . VAGINAL HYSTERECTOMY      Prior to Admission medications   Medication Sig Start Date End Date Taking? Authorizing Provider  aspirin 81 MG tablet Take 81 mg by mouth daily.    Historical Provider, MD  diltiazem (CARDIZEM SR) 120 MG 12 hr capsule Take 120 mg by mouth daily.    Historical Provider, MD  dorzolamide (TRUSOPT) 2 % ophthalmic solution 1 drop 2 (two) times daily.    Historical Provider, MD  furosemide (LASIX) 40 MG tablet Take 40 mg by mouth daily.     Historical Provider, MD  latanoprost (XALATAN) 0.005 % ophthalmic solution Place 1 drop into both eyes at bedtime.    Historical Provider, MD  lisinopril-hydrochlorothiazide (PRINZIDE,ZESTORETIC) 10-12.5 MG tablet Take 0.5 tablets by mouth daily. 07/31/16   Pleas Koch, NP  metoprolol tartrate (LOPRESSOR) 25 MG tablet Take 0.5 tablets (12.5 mg total) by mouth 2 (two) times daily. 11/06/16   Pleas Koch, NP  omeprazole (PRILOSEC) 20 MG capsule TAKE ONE (1) CAPSULE BY MOUTH 2 TIMES DAILY BEFORE MEALS 10/16/16   Pleas Koch, NP  POLY-IRON 150 150 MG capsule TAKE ONE (1) CAPSULE BY MOUTH 2 TIMES DAILY 11/20/16   Pleas Koch, NP  potassium chloride (K-DUR,KLOR-CON) 10 MEQ tablet Take 1 tablet by mouth Monday through Friday. 11/06/16   Pleas Koch,  NP    Allergies  Allergen Reactions  . Alphagan [Brimonidine]     Arm numbness      Family History  Problem Relation Age of Onset  . Stroke Mother   . Aneurysm Father   . Cancer Sister     Colon cancer    Social History Social History  Substance Use Topics  . Smoking status: Former Smoker    Packs/day: 2.00    Years: 25.00    Types: Cigarettes    Quit date: 11/07/1987  . Smokeless tobacco: Never Used  . Alcohol use No    Review of Systems Constitutional: Negative for fever. Cardiovascular: Negative for chest pain. Respiratory: Negative for shortness of breath. Gastrointestinal: Negative for  abdominal pain Genitourinary: Negative for dysuria. Musculoskeletal: Right hip pain Neurological: Negative for headache 10-point ROS otherwise negative.  ____________________________________________   PHYSICAL EXAM:  VITAL SIGNS: ED Triage Vitals  Enc Vitals Group     BP 12/04/16 1420 132/76     Pulse Rate 12/04/16 1420 97     Resp 12/04/16 1420 20     Temp 12/04/16 1420 97.4 F (36.3 C)     Temp Source 12/04/16 1420 Oral     SpO2 12/04/16 1420 100 %     Weight 12/04/16 1421 90 lb (40.8 kg)     Height 12/04/16 1421 5' (1.524 m)     Head Circumference --      Peak Flow --      Pain Score 12/04/16 1421 9     Pain Loc --      Pain Edu? --      Excl. in [place]? --     Constitutional: Alert and oriented. Well appearing and in no distress. Eyes: Normal exam ENT   Head: Normocephalic and atraumatic.   Mouth/Throat: Mucous membranes are moist. Cardiovascular: Irregular rhythm, rate around 60 bpm. Respiratory: Normal respiratory effort without tachypnea nor retractions. Breath sounds are clear Gastrointestinal: Soft and nontender. No distention. Musculoskeletal: Moderate tenderness palpation of the right hip, slightly shortened and externally rotated right lower extremity, 1+ DP pulse. Neurologic:  Normal speech and language. No gross focal neurologic deficits Skin:  Skin is warm, dry and intact.  Psychiatric: Mood and affect are normal.   ____________________________________________    EKG  EKG reviewed and interpreted by myself shows atrial fibrillation 91 bpm, narrow QRS, normal axis, no concerning ST changes.  ____________________________________________    RADIOLOGY  X-ray shows subcapital right hip fracture.  ____________________________________________   INITIAL IMPRESSION / ASSESSMENT AND PLAN / ED COURSE  Pertinent labs & imaging results that were available during my care of the patient were reviewed by me and considered in my medical decision making  (see chart for details).  The patient presents to the emergency department 3 days of right hip pain acutely worse today. Patient has a shortened and externally rotated right lower extremity. Denies any history of fracture in the past. We will check labs, EKG, chest x-ray and right hip x-ray. Highly suspect right hip fracture. Neurovascular intact with 1+ DP pulse.  X-ray shows subcapital right hip fracture. Patient is unable to ambulate we will admit to the hospital for pain control and orthopedic consultation.  ____________________________________________   FINAL CLINICAL IMPRESSION(S) / ED DIAGNOSES  Right hip fracture    Harvest Dark, MD 12/04/16 1517

## 2016-12-04 NOTE — Progress Notes (Signed)
Subjective:    Patient ID: Girtha Rm, female    DOB: 03/31/1922, 83 y.o.   MRN: [id number]  HPI This is a 83 yo female, accompanied by her son. She presents with difficulty walking x 3 days. Has had pain in groin x 3 days. Legs feel week. No recent falls. No dysuria. She has a step to go up and down to get to living room, pain seemed to start after going up step. Occasional constipation with taking iron. Had a bowel movement this morning. No upper extremity weakness. No cough. No increased SOB. No headache, no dizziness.   Past Medical History:  Diagnosis Date  . A-fib ([place])   . Breast cancer ([place])    right  . CHF (congestive heart failure) ([place])   . Diabetes mellitus without complication ([place])   . Diverticulosis   . GERD (gastroesophageal reflux disease)   . Glaucoma   . H/O: GI bleed   . History of colonic polyps   . HTN (hypertension)   . Hyperlipidemia   . Palpitations   . PUD (peptic ulcer disease)   . PVD (peripheral vascular disease) ([place])    Past Surgical History:  Procedure Laterality Date  . HIP SURGERY     left hip  . MASTECTOMY     right breast   . RENAL ARTERY STENT    . VAGINAL HYSTERECTOMY     Family History  Problem Relation Age of Onset  . Stroke Mother   . Aneurysm Father   . Cancer Sister     Colon cancer   Social History  Substance Use Topics  . Smoking status: Former Smoker    Packs/day: 2.00    Years: 25.00    Types: Cigarettes    Quit date: 11/07/1987  . Smokeless tobacco: Never Used  . Alcohol use No      Review of Systems Per HPI    Objective:   Physical Exam  Constitutional: She appears well-developed and well-nourished. No distress.  HENT:  Head: Normocephalic and atraumatic.  Eyes: Conjunctivae are normal.  Cardiovascular: Normal rate.   Pulmonary/Chest: Effort normal.  Musculoskeletal:       Right hip: She exhibits decreased range of motion and tenderness.       Left hip: She exhibits tenderness. She exhibits  normal range of motion.  Right hip very painful with any movement of right leg. Left hip with better ROM, less painful with movement compared to right.   Skin: She is not diaphoretic.  Vitals reviewed.     BP 98/64 (BP Location: Left Arm, Patient Position: Sitting, Cuff Size: Normal)   Pulse 79   Temp 98.4 F (36.9 C) (Oral)   LMP  (LMP Unknown)   SpO2 100%  Wt Readings from Last 3 Encounters:  11/09/16 90 lb 11.5 oz (41.2 kg)  11/06/16 91 lb 8 oz (41.5 kg)  11/06/16 91 lb 8 oz (41.5 kg)   BP Readings from Last 3 Encounters:  12/04/16 98/64  11/23/16 116/73  11/16/16 (!) 131/57  Dg Lumbar Spine 2-3 Views  Result Date: 12/04/2016 CLINICAL DATA:  Right hip pain.  Difficulty walking. EXAM: LUMBAR SPINE - 2-3 VIEW COMPARISON:  None FINDINGS: Degenerative disc disease in the upper and mid lumbar spine. Degenerative set disease in the lower lumbar spine. Slight anterolisthesis of L5 on S1. No fracture. Diffuse osteopenia. Diffuse aortic and iliac calcifications. Bilateral common iliac stents noted. IMPRESSION: Degenerative disc and facet disease. Osteopenia. No acute findings. Electronically Signed  By: Rolm Baptise M.D.   On: 12/04/2016 12:23   Dg Hip Unilat W Or W/o Pelvis 2-3 Views Right  Result Date: 12/04/2016 CLINICAL DATA:  Right hip pain.  Difficulty walking. EXAM: DG HIP (WITH OR WITHOUT PELVIS) 2-3V RIGHT COMPARISON:  None. FINDINGS: There is a right femoral neck fracture with mild varus angulation. Mild degenerative changes in the hips bilaterally. Hardware noted in the left proximal femur. IMPRESSION: Right femoral neck fracture with varus angulation. Electronically Signed   By: Rolm Baptise M.D.   On: 12/04/2016 12:22        Assessment & Plan:  1. Right hip pain - DG HIP UNILAT W OR W/O PELVIS 2-3 VIEWS RIGHT; Future - DG Lumbar Spine 2-3 Views; Future  2. Difficulty walking - DG HIP UNILAT W OR W/O PELVIS 2-3 VIEWS RIGHT; Future - DG Lumbar Spine 2-3 Views;  Future  3. Weakness of both lower extremities - DG HIP UNILAT W OR W/O PELVIS 2-3 VIEWS RIGHT; Future - DG Lumbar Spine 2-3 Views; Future  4. Displaced fracture of right femoral neck ([place]) - discussed xray findings with patient and her son, due to extreme pain, 911 called for transfer to ER.   Clarene Reamer, FNP-BC  [facility]  12/04/2016 12:39 PM

## 2016-12-04 NOTE — Patient Instructions (Signed)
You have a fracture of your hip.

## 2016-12-04 NOTE — ED Notes (Signed)
Patient transported to X-ray 

## 2016-12-05 ENCOUNTER — Inpatient Hospital Stay: Payer: Medicare Other

## 2016-12-05 ENCOUNTER — Inpatient Hospital Stay: Payer: Medicare Other | Admitting: Anesthesiology

## 2016-12-05 ENCOUNTER — Inpatient Hospital Stay (HOSPITAL_COMMUNITY)
Admit: 2016-12-05 | Discharge: 2016-12-05 | Disposition: A | Discharge status: Home / Self Care | Payer: Medicare Other | Attending: Internal Medicine | Admitting: Internal Medicine

## 2016-12-05 ENCOUNTER — Encounter: Payer: Self-pay | Admitting: Anesthesiology

## 2016-12-05 ENCOUNTER — Encounter
Admission: EM | Disposition: A | Discharge status: Skilled Nursing Facility | Payer: Self-pay | Source: Home / Self Care | Attending: Internal Medicine

## 2016-12-05 DIAGNOSIS — S72001A Fracture of unspecified part of neck of right femur, initial encounter for closed fracture: Secondary | ICD-10-CM

## 2016-12-05 DIAGNOSIS — Z01810 Encounter for preprocedural cardiovascular examination: Secondary | ICD-10-CM

## 2016-12-05 DIAGNOSIS — I509 Heart failure, unspecified: Secondary | ICD-10-CM

## 2016-12-05 DIAGNOSIS — I482 Chronic atrial fibrillation: Secondary | ICD-10-CM

## 2016-12-05 DIAGNOSIS — I272 Pulmonary hypertension, unspecified: Secondary | ICD-10-CM

## 2016-12-05 DIAGNOSIS — R188 Other ascites: Secondary | ICD-10-CM

## 2016-12-05 HISTORY — PX: HIP ARTHROPLASTY: SHX981

## 2016-12-05 LAB — COMPREHENSIVE METABOLIC PANEL
ALK PHOS: 91 U/L (ref 38–126)
ALT: 12 U/L — AB (ref 14–54)
AST: 23 U/L (ref 15–41)
Albumin: 2.8 g/dL — ABNORMAL LOW (ref 3.5–5.0)
Anion gap: 7 (ref 5–15)
BUN: 43 mg/dL — ABNORMAL HIGH (ref 6–20)
CALCIUM: 9.4 mg/dL (ref 8.9–10.3)
CO2: 24 mmol/L (ref 22–32)
CREATININE: 1.65 mg/dL — AB (ref 0.44–1.00)
Chloride: 108 mmol/L (ref 101–111)
GFR, EST AFRICAN AMERICAN: 30 mL/min — AB (ref >60)
GFR, EST NON AFRICAN AMERICAN: 26 mL/min — AB (ref >60)
Glucose, Bld: 96 mg/dL (ref 65–99)
Potassium: 3.2 mmol/L — ABNORMAL LOW (ref 3.5–5.1)
Sodium: 139 mmol/L (ref 135–145)
Total Bilirubin: 0.7 mg/dL (ref 0.3–1.2)
Total Protein: 6.3 g/dL — ABNORMAL LOW (ref 6.5–8.1)

## 2016-12-05 LAB — ECHOCARDIOGRAM COMPLETE
Height: 60 in
WEIGHTICAEL: 1507.2 [oz_av]

## 2016-12-05 LAB — GLUCOSE, CAPILLARY
GLUCOSE-CAPILLARY: 96 mg/dL (ref 65–99)
Glucose-Capillary: 129 mg/dL — ABNORMAL HIGH (ref 65–99)
Glucose-Capillary: 110 mg/dL — ABNORMAL HIGH (ref 65–99)
Glucose-Capillary: 83 mg/dL (ref 65–99)
Glucose-Capillary: 86 mg/dL (ref 65–99)

## 2016-12-05 LAB — CBC
HCT: 27 % — ABNORMAL LOW (ref 35.0–47.0)
HEMOGLOBIN: 8.7 g/dL — AB (ref 12.0–16.0)
MCH: 26.8 pg (ref 26.0–34.0)
MCHC: 32.3 g/dL (ref 32.0–36.0)
MCV: 83 fL (ref 80.0–100.0)
Platelets: 140 10*3/uL — ABNORMAL LOW (ref 150–440)
RBC: 3.25 MIL/uL — AB (ref 3.80–5.20)
RDW: 25.7 % — ABNORMAL HIGH (ref 11.5–14.5)
WBC: 3.4 10*3/uL — ABNORMAL LOW (ref 3.6–11.0)

## 2016-12-05 SURGERY — HEMIARTHROPLASTY, HIP, DIRECT ANTERIOR APPROACH, FOR FRACTURE
Anesthesia: General | Laterality: Right

## 2016-12-05 MED ORDER — VASOPRESSIN 20 UNIT/ML IV SOLN
INTRAVENOUS | Status: AC
  Filled 2016-12-05: qty 1

## 2016-12-05 MED ORDER — MORPHINE SULFATE (PF) 2 MG/ML IV SOLN: 2.0000 mg | INTRAVENOUS | Status: DC | PRN

## 2016-12-05 MED ORDER — ONDANSETRON HCL 4 MG/2ML IJ SOLN: 4.0000 mg | Freq: Four times a day (QID) | INTRAMUSCULAR | Status: DC | PRN

## 2016-12-05 MED ORDER — MENTHOL 3 MG MT LOZG
1.0000 | LOZENGE | OROMUCOSAL | Status: DC | PRN
  Filled 2016-12-05: qty 9

## 2016-12-05 MED ORDER — FLEET ENEMA 7-19 GM/118ML RE ENEM: 1.0000 | ENEMA | Freq: Once | RECTAL | Status: DC | PRN

## 2016-12-05 MED ORDER — FENTANYL CITRATE (PF) 100 MCG/2ML IJ SOLN: 25.0000 ug | INTRAMUSCULAR | Status: DC | PRN

## 2016-12-05 MED ORDER — METOCLOPRAMIDE HCL 10 MG PO TABS
10.0000 mg | ORAL_TABLET | Freq: Three times a day (TID) | ORAL | Status: DC
  Administered 2016-12-06 – 2016-12-07 (×5): 10 mg via ORAL
  Filled 2016-12-05 (×5): qty 1

## 2016-12-05 MED ORDER — MAGNESIUM HYDROXIDE 400 MG/5ML PO SUSP
30.0000 mL | Freq: Every day | ORAL | Status: DC | PRN
  Administered 2016-12-06: 30 mL via ORAL
  Filled 2016-12-05: qty 30

## 2016-12-05 MED ORDER — PANTOPRAZOLE SODIUM 40 MG PO TBEC
40.0000 mg | DELAYED_RELEASE_TABLET | Freq: Two times a day (BID) | ORAL | Status: DC
  Administered 2016-12-05 – 2016-12-07 (×4): 40 mg via ORAL
  Filled 2016-12-05 (×4): qty 1

## 2016-12-05 MED ORDER — SODIUM CHLORIDE 0.9 % IV SOLN
INTRAVENOUS | Status: DC
  Administered 2016-12-05 – 2016-12-06 (×3): via INTRAVENOUS

## 2016-12-05 MED ORDER — ACETAMINOPHEN 650 MG RE SUPP: 650.0000 mg | Freq: Four times a day (QID) | RECTAL | Status: DC | PRN

## 2016-12-05 MED ORDER — SUGAMMADEX SODIUM 200 MG/2ML IV SOLN
INTRAVENOUS | Status: DC | PRN
  Administered 2016-12-05: 85.4 mg via INTRAVENOUS

## 2016-12-05 MED ORDER — OXYCODONE HCL 5 MG PO TABS
5.0000 mg | ORAL_TABLET | ORAL | Status: DC | PRN
  Administered 2016-12-05 – 2016-12-07 (×7): 5 mg via ORAL
  Filled 2016-12-05 (×7): qty 1

## 2016-12-05 MED ORDER — NEOMYCIN-POLYMYXIN B GU 40-200000 IR SOLN
Status: DC | PRN
  Administered 2016-12-05: 12 mL

## 2016-12-05 MED ORDER — ROCURONIUM BROMIDE 50 MG/5ML IV SOSY
PREFILLED_SYRINGE | INTRAVENOUS | Status: AC
  Filled 2016-12-05: qty 5

## 2016-12-05 MED ORDER — ONDANSETRON HCL 4 MG PO TABS: 4.0000 mg | ORAL_TABLET | Freq: Four times a day (QID) | ORAL | Status: DC | PRN

## 2016-12-05 MED ORDER — ONDANSETRON HCL 4 MG/2ML IJ SOLN
INTRAMUSCULAR | Status: AC
  Filled 2016-12-05: qty 2

## 2016-12-05 MED ORDER — PHENYLEPHRINE HCL 10 MG/ML IJ SOLN
INTRAMUSCULAR | Status: DC | PRN
  Administered 2016-12-05 (×3): 100 ug via INTRAVENOUS

## 2016-12-05 MED ORDER — FERROUS SULFATE 325 (65 FE) MG PO TABS
325.0000 mg | ORAL_TABLET | Freq: Two times a day (BID) | ORAL | Status: DC
  Administered 2016-12-06 – 2016-12-07 (×3): 325 mg via ORAL
  Filled 2016-12-05 (×3): qty 1

## 2016-12-05 MED ORDER — PROPOFOL 10 MG/ML IV BOLUS
INTRAVENOUS | Status: AC
  Filled 2016-12-05: qty 20

## 2016-12-05 MED ORDER — VASOPRESSIN 20 UNIT/ML IV SOLN
INTRAVENOUS | Status: DC | PRN
  Administered 2016-12-05: 1 [IU] via INTRAVENOUS

## 2016-12-05 MED ORDER — ROCURONIUM BROMIDE 100 MG/10ML IV SOLN
INTRAVENOUS | Status: DC | PRN
  Administered 2016-12-05: 30 mg via INTRAVENOUS
  Administered 2016-12-05: 20 mg via INTRAVENOUS
  Administered 2016-12-05: 10 mg via INTRAVENOUS

## 2016-12-05 MED ORDER — TRAMADOL HCL 50 MG PO TABS: 50.0000 mg | ORAL_TABLET | ORAL | Status: DC | PRN

## 2016-12-05 MED ORDER — METOCLOPRAMIDE HCL 10 MG PO TABS: 5.0000 mg | ORAL_TABLET | Freq: Three times a day (TID) | ORAL | Status: DC | PRN

## 2016-12-05 MED ORDER — ACETAMINOPHEN 10 MG/ML IV SOLN
1000.0000 mg | Freq: Four times a day (QID) | INTRAVENOUS | Status: AC
  Administered 2016-12-05 – 2016-12-06 (×4): 1000 mg via INTRAVENOUS
  Filled 2016-12-05 (×3): qty 100

## 2016-12-05 MED ORDER — ENOXAPARIN SODIUM 40 MG/0.4ML ~~LOC~~ SOLN: 40.0000 mg | SUBCUTANEOUS | Status: DC

## 2016-12-05 MED ORDER — CEFAZOLIN (ANCEF) 1 G IV SOLR: 2.0000 g | Freq: Four times a day (QID) | INTRAVENOUS | Status: DC

## 2016-12-05 MED ORDER — POTASSIUM CHLORIDE CRYS ER 20 MEQ PO TBCR
40.0000 meq | EXTENDED_RELEASE_TABLET | Freq: Two times a day (BID) | ORAL | Status: AC
  Administered 2016-12-05: 40 meq via ORAL
  Filled 2016-12-05: qty 2

## 2016-12-05 MED ORDER — FENTANYL CITRATE (PF) 100 MCG/2ML IJ SOLN
INTRAMUSCULAR | Status: AC
  Filled 2016-12-05: qty 2

## 2016-12-05 MED ORDER — ACETAMINOPHEN 10 MG/ML IV SOLN
INTRAVENOUS | Status: AC
  Administered 2016-12-05: 1000 mg via INTRAVENOUS
  Filled 2016-12-05: qty 100

## 2016-12-05 MED ORDER — PHENOL 1.4 % MT LIQD
1.0000 | OROMUCOSAL | Status: DC | PRN
  Filled 2016-12-05: qty 177

## 2016-12-05 MED ORDER — FENTANYL CITRATE (PF) 100 MCG/2ML IJ SOLN
INTRAMUSCULAR | Status: DC | PRN
  Administered 2016-12-05 (×4): 25 ug via INTRAVENOUS

## 2016-12-05 MED ORDER — CEFAZOLIN SODIUM-DEXTROSE 2-4 GM/100ML-% IV SOLN
2.0000 g | Freq: Four times a day (QID) | INTRAVENOUS | Status: DC
  Filled 2016-12-05 (×3): qty 100

## 2016-12-05 MED ORDER — METOCLOPRAMIDE HCL 5 MG/ML IJ SOLN: 5.0000 mg | Freq: Three times a day (TID) | INTRAMUSCULAR | Status: DC | PRN

## 2016-12-05 MED ORDER — CEFAZOLIN SODIUM-DEXTROSE 2-4 GM/100ML-% IV SOLN: 2.0000 g | Freq: Four times a day (QID) | INTRAVENOUS | Status: DC

## 2016-12-05 MED ORDER — SODIUM CHLORIDE 0.9 % IV SOLN
INTRAVENOUS | Status: DC | PRN
  Administered 2016-12-05: 200 ug/min via INTRAVENOUS

## 2016-12-05 MED ORDER — SODIUM CHLORIDE 0.9 % IV SOLN
INTRAVENOUS | Status: DC | PRN
  Administered 2016-12-05: 50 ug/min via INTRAVENOUS

## 2016-12-05 MED ORDER — PROPOFOL 10 MG/ML IV BOLUS
INTRAVENOUS | Status: DC | PRN
  Administered 2016-12-05: 60 mg via INTRAVENOUS

## 2016-12-05 MED ORDER — ACETAMINOPHEN 325 MG PO TABS: 650.0000 mg | ORAL_TABLET | Freq: Four times a day (QID) | ORAL | Status: DC | PRN

## 2016-12-05 MED ORDER — ONDANSETRON HCL 4 MG/2ML IJ SOLN: 4.0000 mg | Freq: Once | INTRAMUSCULAR | Status: DC | PRN

## 2016-12-05 MED ORDER — SENNOSIDES-DOCUSATE SODIUM 8.6-50 MG PO TABS
1.0000 | ORAL_TABLET | Freq: Two times a day (BID) | ORAL | Status: DC
  Administered 2016-12-05 – 2016-12-07 (×4): 1 via ORAL
  Filled 2016-12-05 (×4): qty 1

## 2016-12-05 MED ORDER — SODIUM CHLORIDE 0.9 % IV SOLN
INTRAVENOUS | Status: DC | PRN
  Administered 2016-12-05: 18:00:00 via INTRAVENOUS

## 2016-12-05 MED ORDER — LIDOCAINE HCL (PF) 2 % IJ SOLN
INTRAMUSCULAR | Status: AC
  Filled 2016-12-05: qty 2

## 2016-12-05 MED ORDER — BISACODYL 10 MG RE SUPP: 10.0000 mg | Freq: Every day | RECTAL | Status: DC | PRN

## 2016-12-05 SURGICAL SUPPLY — 58 items
BAG DECANTER FOR FLEXI CONT (MISCELLANEOUS) ×3 IMPLANT
BLADE SAW 1 (BLADE) ×3 IMPLANT
CANISTER SUCT 1200ML W/VALVE (MISCELLANEOUS) ×3 IMPLANT
CANISTER SUCT 3000ML (MISCELLANEOUS) ×3 IMPLANT
CAPT HIP HEMI 1 ×3 IMPLANT
CATH FOL LEG HOLDER (MISCELLANEOUS) ×3 IMPLANT
CEMENT HV SMART SET (Cement) ×6 IMPLANT
DRAPE INCISE IOBAN 66X60 STRL (DRAPES) ×3 IMPLANT
DRAPE SHEET LG 3/4 BI-LAMINATE (DRAPES) ×3 IMPLANT
DRAPE TABLE BACK 80X90 (DRAPES) ×3 IMPLANT
DRSG DERMACEA 8X12 NADH (GAUZE/BANDAGES/DRESSINGS) ×3 IMPLANT
DRSG OPSITE POSTOP 4X12 (GAUZE/BANDAGES/DRESSINGS) ×3 IMPLANT
DRSG OPSITE POSTOP 4X14 (GAUZE/BANDAGES/DRESSINGS) IMPLANT
DRSG TEGADERM 4X4.75 (GAUZE/BANDAGES/DRESSINGS) ×3 IMPLANT
DURAPREP 26ML APPLICATOR (WOUND CARE) ×6 IMPLANT
ELECT BLADE 6.5 EXT (BLADE) ×3 IMPLANT
ELECT CAUTERY BLADE 6.4 (BLADE) ×3 IMPLANT
ELECT REM PT RETURN 9FT ADLT (ELECTROSURGICAL) ×3
ELECTRODE REM PT RTRN 9FT ADLT (ELECTROSURGICAL) ×1 IMPLANT
GAUZE PACK 2X3YD (MISCELLANEOUS) ×3 IMPLANT
GLOVE BIO SURGEON STRL SZ8 (GLOVE) ×6 IMPLANT
GLOVE BIOGEL M STRL SZ7.5 (GLOVE) ×6 IMPLANT
GLOVE BIOGEL PI IND STRL 9 (GLOVE) IMPLANT
GLOVE BIOGEL PI INDICATOR 9 (GLOVE)
GLOVE INDICATOR 8.0 STRL GRN (GLOVE) ×6 IMPLANT
GOWN STRL REUS W/ TWL LRG LVL3 (GOWN DISPOSABLE) ×3 IMPLANT
GOWN STRL REUS W/TWL 2XL LVL3 (GOWN DISPOSABLE) IMPLANT
GOWN STRL REUS W/TWL LRG LVL3 (GOWN DISPOSABLE) ×6
HANDPIECE INTERPULSE COAX TIP (DISPOSABLE) ×2
HEMOVAC 400CC 10FR (MISCELLANEOUS) ×3 IMPLANT
HOOD PEEL AWAY FLYTE STAYCOOL (MISCELLANEOUS) ×3 IMPLANT
IV NS 100ML SINGLE PACK (IV SOLUTION) ×3 IMPLANT
KIT RM TURNOVER STRD PROC AR (KITS) ×3 IMPLANT
NDL SAFETY 18GX1.5 (NEEDLE) ×3 IMPLANT
NEEDLE FILTER BLUNT 18X 1/2SAF (NEEDLE) ×2
NEEDLE FILTER BLUNT 18X1 1/2 (NEEDLE) ×1 IMPLANT
NS IRRIG 1000ML POUR BTL (IV SOLUTION) ×3 IMPLANT
PACK HIP PROSTHESIS (MISCELLANEOUS) ×3 IMPLANT
PRESSURIZER CEMENT PROX FEM SM (MISCELLANEOUS) ×3 IMPLANT
PRESSURIZER FEM CANAL M (MISCELLANEOUS) IMPLANT
SET HNDPC FAN SPRY TIP SCT (DISPOSABLE) ×1 IMPLANT
SOL .9 NS 3000ML IRR  AL (IV SOLUTION) ×2
SOL .9 NS 3000ML IRR UROMATIC (IV SOLUTION) ×1 IMPLANT
SOL PREP PVP 2OZ (MISCELLANEOUS) ×3
SOLUTION PREP PVP 2OZ (MISCELLANEOUS) ×1 IMPLANT
SPONGE DRAIN TRACH 4X4 STRL 2S (GAUZE/BANDAGES/DRESSINGS) ×3 IMPLANT
STAPLER SKIN PROX 35W (STAPLE) ×3 IMPLANT
SUT ETHIBOND #5 BRAIDED 30INL (SUTURE) ×3 IMPLANT
SUT VIC AB 0 CT1 36 (SUTURE) ×3 IMPLANT
SUT VIC AB 1 CT1 36 (SUTURE) ×6 IMPLANT
SUT VIC AB 2-0 CT1 27 (SUTURE) ×2
SUT VIC AB 2-0 CT1 TAPERPNT 27 (SUTURE) ×1 IMPLANT
SYR 20CC LL (SYRINGE) ×3 IMPLANT
SYR TB 1ML LUER SLIP (SYRINGE) ×3 IMPLANT
TAPE TRANSPORE STRL 2 31045 (GAUZE/BANDAGES/DRESSINGS) ×3 IMPLANT
TIP COAXIAL FEMORAL CANAL (MISCELLANEOUS) ×3 IMPLANT
TOWER CARTRIDGE SMART MIX (DISPOSABLE) ×3 IMPLANT
WATER STERILE IRR 1000ML POUR (IV SOLUTION) ×6 IMPLANT

## 2016-12-05 NOTE — Consult Note (Signed)
Cardiology Consultation Note  Patient ID: Sharon Frazier, MRN: [id number], DOB/AGE: [birth year] 83 y.o. Admit date: 12/04/2016   Date of Consult: 12/05/2016 Primary Physician: Sheral Flow, NP Primary Cardiologist: Dr. Rockey Situ, MD Requesting Physician: Dr. Margaretmary Eddy, MD  Chief Complaint: Right hip pain Reason for Consult: Cardiac preoperative clearance  HPI: 83 y.o. female with h/o Chronic A. fib not on long-term full dose anticoagulation secondary to prior GI bleeding originally diagnosed in 05/239, chronic diastolic CHF, right temporoparietal CVA and 01/2014, iron deficiency anemia, status post prior laser treatment of gastric angiodysplasias, peptic ulcer disease, GERD, diverticulosis with internal hemorrhoids, hypertension, hyperlipidemia, diabetes mellitus type 2, prior tobacco abuse, and right sided breast cancer status post mastectomy and chemotherapy who presented to [facility] on 3/2 with a three-day history of right hip pain and was found to have a right hip fracture. No known trauma or mechanical fall. Cardiology is asked to evaluate for preoperative clearance.  Echocardiogram from 11/2013 demonstrated EF 60-65, mild MR, mild TR, mildly elevated right-sided pressure, normal RV systolic size and function, rhythm was atrial fibrillation. Echocardiogram was performed 2 months after this in the setting of acute right sided CVA that did not demonstrate source of emboli, with preserved EF. Admitted to the hospital and 03/2014 with increased shortness of breath and lower 70 swelling. Was noted to have pleural effusion and initially treated with IV Lasix for diuresis. Did require a right sided thoracentesis during this admission. Secondly she did receive one unit of packed RBCs secondary to anemia. No echocardiogram done at that time. Patient was most recently seen by Dr. Elenore Rota 09/04/2016 and was doing well at that time. She was taking Lasix daily for trace lower extremity edema. Her atrial  fibrillation was well controlled at that time and she was not felt to be volume overloaded.  Patient presented to [facility] on 3/2 with worsening right hip pain for approximately the past 3 days prior to her admission. She has denied any fall or trauma to that area. Pain has gotten progressively worse to the point where it is limiting her ability to ambulate. In the ED plain film revealed a right femoral neck fracture with varus angulation. Labs showed initial hemoglobin of 10.2 with white count 6.9 platelet count 171, labs this morning have shown a drop in hemoglobin to 8.7 with a drop in white blood cell count of 3.4 and platelet count 140 (possibly secondary to dilution). Upon her admission potassium was noted to be 3.9 which is down trended to 3.2 this morning. Renal function 2.03 upon admission, improved to 1.65 at time of cardiology consultation. Baseline appears to be approximately 1.3-1.6. Echocardiogram is pending to evaluate LV systolic function and wall motion. Upon talking with the patient's morning patient denies any episodes of chest pain or shortness of breath. Prior to the onset of her above right hip pain patient had a functional capacity of at least 4 METs.   Past Medical History:  Diagnosis Date  . A-fib ([place])   . Breast cancer ([place])    right  . CHF (congestive heart failure) ([place])   . Diabetes mellitus without complication ([place])   . Diverticulosis   . GERD (gastroesophageal reflux disease)   . Glaucoma   . H/O: GI bleed   . History of colonic polyps   . HTN (hypertension)   . Hyperlipidemia   . Palpitations   . PUD (peptic ulcer disease)   . PVD (peripheral vascular disease) ([place])       Most Recent  Cardiac Studies: TTE 11/2013: EF 60-65, mild MR, mild TR, mildly elevated right-sided pressure, normal RV size and systolic function, rhythm is atrial fibrillation    Surgical History:  Past Surgical History:  Procedure Laterality Date  . MASTECTOMY     right breast   . Open  reduction and internal fixation of a left intertrochanteric femur fracture  2007   Dr. Leanor Kail  . RENAL ARTERY STENT    . VAGINAL HYSTERECTOMY       Home Meds: Prior to Admission medications   Medication Sig Start Date End Date Taking? Authorizing Provider  aspirin 81 MG tablet Take 81 mg by mouth daily.   Yes Historical Provider, MD  diltiazem (CARDIZEM SR) 120 MG 12 hr capsule Take 120 mg by mouth daily.   Yes Historical Provider, MD  dorzolamide (TRUSOPT) 2 % ophthalmic solution 1 drop 2 (two) times daily.   Yes Historical Provider, MD  furosemide (LASIX) 40 MG tablet Take 40 mg by mouth daily.    Yes Historical Provider, MD  latanoprost (XALATAN) 0.005 % ophthalmic solution Place 1 drop into both eyes at bedtime.   Yes Historical Provider, MD  lisinopril-hydrochlorothiazide (PRINZIDE,ZESTORETIC) 10-12.5 MG tablet Take 0.5 tablets by mouth daily. 07/31/16  Yes Pleas Koch, NP  metoprolol tartrate (LOPRESSOR) 25 MG tablet Take 0.5 tablets (12.5 mg total) by mouth 2 (two) times daily. 11/06/16  Yes Pleas Koch, NP  omeprazole (PRILOSEC) 20 MG capsule TAKE ONE (1) CAPSULE BY MOUTH 2 TIMES DAILY BEFORE MEALS 10/16/16  Yes Pleas Koch, NP  POLY-IRON 150 150 MG capsule TAKE ONE (1) CAPSULE BY MOUTH 2 TIMES DAILY 11/20/16  Yes Pleas Koch, NP  potassium chloride (K-DUR,KLOR-CON) 10 MEQ tablet Take 1 tablet by mouth Monday through Friday. 11/06/16  Yes Pleas Koch, NP    Inpatient Medications:  . ceFAZolin  2 g Intravenous To OR  . diltiazem  120 mg Oral Daily  . docusate sodium  100 mg Oral BID  . dorzolamide  1 drop Both Eyes BID  . furosemide  40 mg Oral Daily  . insulin aspart  0-5 Units Subcutaneous QHS  . insulin aspart  0-9 Units Subcutaneous TID WC  . iron polysaccharides  150 mg Oral Daily  . latanoprost  1 drop Both Eyes QHS  . metoprolol tartrate  12.5 mg Oral BID  . pantoprazole  40 mg Oral Daily  . potassium chloride  40 mEq Oral BID   .  sodium chloride 50 mL/hr at 12/04/16 2205    Allergies:  Allergies  Allergen Reactions  . Alphagan [Brimonidine]     Arm numbness      Social History   Social History  . Marital status: Widowed    Spouse name: N/A  . Number of children: N/A  . Years of education: N/A   Occupational History  . Not on file.   Social History Main Topics  . Smoking status: Former Smoker    Packs/day: 2.00    Years: 25.00    Types: Cigarettes    Quit date: 11/07/1987  . Smokeless tobacco: Never Used  . Alcohol use No  . Drug use: No  . Sexual activity: No   Other Topics Concern  . Not on file   Social History Narrative   Widow   Retired. Once worked as a Materials engineer.   Enjoys cross word puzzles.      Family History  Problem Relation Age of Onset  . Stroke Mother   .  Aneurysm Father   . Cancer Sister     Colon cancer     Review of Systems: Review of Systems  Constitutional: Positive for malaise/fatigue. Negative for chills, diaphoresis, fever and weight loss.  HENT: Negative for congestion.   Eyes: Negative for discharge and redness.  Respiratory: Negative for cough, hemoptysis, sputum production, shortness of breath and wheezing.   Cardiovascular: Negative for chest pain, palpitations, orthopnea, claudication, leg swelling and PND.  Gastrointestinal: Negative for abdominal pain, blood in stool, heartburn, melena, nausea and vomiting.  Genitourinary: Negative for hematuria.  Musculoskeletal: Positive for joint pain and myalgias. Negative for back pain, falls and neck pain.  Skin: Negative for rash.  Neurological: Negative for dizziness, tingling, tremors, sensory change, speech change, focal weakness, loss of consciousness and weakness.  Endo/Heme/Allergies: Does not bruise/bleed easily.  Psychiatric/Behavioral: Negative for substance abuse. The patient is not nervous/anxious.   All other systems reviewed and are negative.   Labs: No results for input(s): CKTOTAL, CKMB,  TROPONINI in the last 72 hours. Lab Results  Component Value Date   WBC 3.4 (L) 12/05/2016   HGB 8.7 (L) 12/05/2016   HCT 27.0 (L) 12/05/2016   MCV 83.0 12/05/2016   PLT 140 (L) 12/05/2016    Recent Labs Lab 12/05/16 0322  NA 139  K 3.2*  CL 108  CO2 24  BUN 43*  CREATININE 1.65*  CALCIUM 9.4  PROT 6.3*  BILITOT 0.7  ALKPHOS 91  ALT 12*  AST 23  GLUCOSE 96   Lab Results  Component Value Date   CHOL 139 11/06/2016   HDL 41.50 11/06/2016   LDLCALC 75 11/06/2016   TRIG 115.0 11/06/2016   No results found for: DDIMER  Radiology/Studies:  Dg Chest 1 View  Result Date: 12/04/2016 CLINICAL DATA:  Right hip fracture.  Ex-smoker. EXAM: CHEST 1 VIEW COMPARISON:  04/23/2014. FINDINGS: Grossly stable enlarged cardiac silhouette. Small to moderate amount of left lateral pleural fluid or thickening, significantly decreased. Mildly prominent interstitial markings with improvement. Thoracic aortic calcifications. Diffuse osteopenia. IMPRESSION: 1. Decreased left pleural thickening or fluid. 2. Stable cardiomegaly. 3. Aortic atherosclerosis. Electronically Signed   By: Claudie Revering M.D.   On: 12/04/2016 14:59   Dg Lumbar Spine 2-3 Views  Result Date: 12/04/2016 CLINICAL DATA:  Right hip pain.  Difficulty walking. EXAM: LUMBAR SPINE - 2-3 VIEW COMPARISON:  None FINDINGS: Degenerative disc disease in the upper and mid lumbar spine. Degenerative set disease in the lower lumbar spine. Slight anterolisthesis of L5 on S1. No fracture. Diffuse osteopenia. Diffuse aortic and iliac calcifications. Bilateral common iliac stents noted. IMPRESSION: Degenerative disc and facet disease. Osteopenia. No acute findings. Electronically Signed   By: Rolm Baptise M.D.   On: 12/04/2016 12:23   Dg Hip Unilat W Or Wo Pelvis 2-3 Views Right  Result Date: 12/04/2016 CLINICAL DATA:  Known right hip fracture. EXAM: DG HIP (WITH OR WITHOUT PELVIS) 2-3V RIGHT COMPARISON:  None. FINDINGS: There is a subcapital right  hip fracture without dislocation. IMPRESSION: Subcapital right hip fracture without dislocation. Electronically Signed   By: Dorise Bullion III M.D   On: 12/04/2016 14:58   Dg Hip Unilat W Or W/o Pelvis 2-3 Views Right  Result Date: 12/04/2016 CLINICAL DATA:  Right hip pain.  Difficulty walking. EXAM: DG HIP (WITH OR WITHOUT PELVIS) 2-3V RIGHT COMPARISON:  None. FINDINGS: There is a right femoral neck fracture with mild varus angulation. Mild degenerative changes in the hips bilaterally. Hardware noted in the left proximal femur.  IMPRESSION: Right femoral neck fracture with varus angulation. Electronically Signed   By: Rolm Baptise M.D.   On: 12/04/2016 12:22    EKG: Interpreted by me showed: Afib, 91 bpm, nonspecific ST-T changes leads II, III, aVF, V2 through V5 (noted on prior EKG) Telemetry: Interpreted by me showed: Not on telemetry  Weights: Filed Weights   12/04/16 1421 12/05/16 0601  Weight: 90 lb (40.8 kg) 94 lb 3.2 oz (42.7 kg)     Physical Exam: Blood pressure 122/77, pulse 88, temperature 98.6 F (37 C), temperature source Oral, resp. rate 16, height 5' (1.524 m), weight 94 lb 3.2 oz (42.7 kg), SpO2 100 %. Body mass index is 18.4 kg/m. General: Frail appearing, in no acute distress. Head: Normocephalic, atraumatic, sclera non-icteric, no xanthomas, nares are without discharge.  Neck: Negative for carotid bruits. JVD not elevated. Lungs: Clear bilaterally to auscultation without wheezes, rales, or rhonchi. Breathing is unlabored. Heart: Irregularly irregular with S1 S2. No murmurs, rubs, or gallops appreciated. Abdomen: Soft, non-tender, non-distended with normoactive bowel sounds. No hepatomegaly. No rebound/guarding. No obvious abdominal masses. Msk:  Strength and tone appear normal for age. Extremities: No clubbing or cyanosis. No edema. Distal pedal pulses are 2+ and equal bilaterally. Neuro: Alert and oriented X 3. No facial asymmetry. No focal deficit. Moves all  extremities spontaneously. Psych:  Responds to questions appropriately with a normal affect.    Assessment and Plan:  Active Problems:   Closed right hip fracture (HCC)    1. Preoperative cardiac evaluation: -Without symptoms concerning for angina, no chest pain -At baseline patient functional capacity later than 4 METs -Check echocardiogram to evaluate LV systolic function and wall motion -If echocardiogram demonstrates preserved ejection fraction and normal wall motion no further inpatient cardiac workup would be recommended at this time  PREOPERATIVE CARDIAC RISK ASSESSMENT:   Revised Cardiac Risk Index:  High Risk Surgery (defined as Intraperitoneal, intrathoracic or suprainguinal vascular): no; (hip fracture repair)  Active CAD: no  CHF: yes (chronic diastolic)  Cerebrovascular Disease: no   Diabetes: yes; On Insulin: no  CKD (Cr >~ 2): no   Estimated Risk of Adverse Outcome: low risk for low risk surgery. Estimated Risk of MI, PE, VF/VT (Cardiac Arrest), Complete Heart Block: 0.9 %   ACC/AHA Guidelines for "Clearance":  Step 1 - Need for Emergency Surgery: no  If Yes - go straight to OR with perioperative surveillance  Step 2 - Active Cardiac Conditions (Unstable Angina, Decompensated HF, Significant  Arrhytmias - Complete HB, Mobitz II, Symptomatic VT or SVT, Severe Aortic Stenosis - mean gradient > 40 mmHg, Valve area < 1.0 cm2): no   If Yes - Evaluate & Treat per ACC/AHA Guidelines  Step 3 -  Low Risk Surgery: yes  If Yes --> proceed to OR  If No --> Step 4  Step 4 - Functional Capacity >= 4 METS without symptoms:   If Yes --> proceed to OR  If No --> Step 5  Step 5 --  Clinical Risk Factors (CRF)  - Zero --> proceed to OR  2. Chronic A. fib: -Not on telemetry for review -Vital signs with heart rate well controlled, on exam patient is not tachycardic -Place patient on telemetry -In the perioperative timeframe avoid excessive IV  fluids -Noted to be hypokalemic at 3.2 this morning, this has been repleted would recommend goal potassium greater than 4.0 -Will check magnesium and replete to at least 2.0 as indicated -Not on long-term full dose anticoagulation as an outpatient  per primary cardiologist secondary to history of GI bleeding -Has been on baby aspirin, this has been held given drop in hemoglobin from 10.22 8.7 this morning -Continue Lopressor and diltiazem for rate control  3. Chronic diastolic CHF: -Patient does not appear grossly volume overloaded at this time -Continue current medications  4. Acute on chronic iron deficiency anemia: -Has previously required blood transfusions most recently at the end of February 2018 -Hemoglobin down trend this morning from 10.22 8.7, some of this may be dilutional , recommend recheck and maintaining hemoglobin greater than 8.5, will defer to IM  5. Hypokalemia: -Replete to goal 4.0 -Check magnesium  6. Right hip fracture: -Patient denies mechanical trauma or fall -Needs outpatient follow-up    Signed, Marcille Blanco Arthur Pager: [phone] 12/05/2016, 11:17 AM

## 2016-12-05 NOTE — Progress Notes (Signed)
Spoke with Dr. Marry Guan, Pt may have a clear liquid breakfast this morning.

## 2016-12-05 NOTE — Transfer of Care (Signed)
Immediate Anesthesia Transfer of Care Note  Patient: Sharon Frazier  Procedure(s) Performed: Procedure(s): ARTHROPLASTY BIPOLAR HIP (HEMIARTHROPLASTY) (Right)  Patient Location: PACU  Anesthesia Type:General  Level of Consciousness: awake, alert  and patient cooperative  Airway & Oxygen Therapy: Patient Spontanous Breathing and Patient connected to face mask oxygen  Post-op Assessment: Report given to RN and Post -op Vital signs reviewed and stable  Post vital signs: Reviewed and stable  Last Vitals:  Vitals:   12/05/16 1617 12/05/16 2200  BP: 104/64 (P) 114/63  Pulse: 76 (P) 77  Resp: 18 (P) 16  Temp: 36.7 C (P) 36.3 C    Last Pain:  Vitals:   12/05/16 1617  TempSrc: Oral  PainSc:          Complications: No apparent anesthesia complications

## 2016-12-05 NOTE — NC FL2 (Signed)
[place] LEVEL OF CARE SCREENING TOOL     IDENTIFICATION  Patient Name: Sharon Frazier Birthdate: [birth year] Sex: female Admission Date (Current Location): 12/04/2016  [place] and [place] Number:  Engineering geologist and Address:  [facility], [address] [place], [place] [address]      Provider Number: [id number]  Attending Physician Name and Address:  Gladstone Lighter, MD  Relative Name and Phone Number:       Current Level of Care: Hospital Recommended Level of Care: [place] Prior Approval Number:    Date Approved/Denied:   PASRR Number:  [id number])  Discharge Plan: SNF    Current Diagnoses: Patient Active Problem List   Diagnosis Date Noted  . Closed right hip fracture ([place]) 12/04/2016  . Iron deficiency anemia due to chronic blood loss 11/09/2016  . Essential hypertension 03/08/2016  . Chronic diastolic CHF (congestive heart failure) ([place]) 06/19/2014  . A-fib ([place]) 12/25/2013  . Diabetes mellitus type 2, controlled ([place]) 12/25/2013  . Anemia 12/25/2013  . GI bleed 12/25/2013  . Hyperlipidemia 12/25/2013    Orientation RESPIRATION BLADDER Height & Weight     Self, Situation, Time, Place  Normal Continent Weight: 94 lb 3.2 oz (42.7 kg) Height:  5' (152.4 cm)  BEHAVIORAL SYMPTOMS/MOOD NEUROLOGICAL BOWEL NUTRITION STATUS   (None. )  (None. ) Continent Diet (Diet: NPO)  AMBULATORY STATUS COMMUNICATION OF NEEDS Skin   Extensive Assist Verbally Normal                       Personal Care Assistance Level of Assistance  Bathing, Feeding, Dressing Bathing Assistance: Limited assistance Feeding assistance: Independent Dressing Assistance: Limited assistance     Functional Limitations Info  Hearing, Speech, Sight Sight Info: Adequate Hearing Info: Adequate Speech Info: Impaired (Missing teeth )    SPECIAL CARE FACTORS FREQUENCY  PT (By licensed PT), OT (By licensed OT)     PT  Frequency:  (5) OT Frequency:  (5)            Contractures      Additional Factors Info  Code Status, Allergies, Insulin Sliding Scale Code Status Info:  (DNR) Allergies Info:  ( Alphagan Brimonidine)   Insulin Sliding Scale Info:  (NovoLog)       Current Medications (12/05/2016):  This is the current hospital active medication list Current Facility-Administered Medications  Medication Dose Route Frequency Provider Last Rate Last Dose  . 0.9 %  sodium chloride infusion   Intravenous Continuous Dereck Leep, MD 50 mL/hr at 12/04/16 2205    . acetaminophen (TYLENOL) tablet 650 mg  650 mg Oral Q6H PRN Nicholes Mango, MD       Or  . acetaminophen (TYLENOL) suppository 650 mg  650 mg Rectal Q6H PRN Nicholes Mango, MD      . ceFAZolin (ANCEF) IVPB 2 g/50 mL premix  2 g Intravenous To OR Napoleon Form, RPH   Stopped at 12/04/16 2205  . diltiazem (DILACOR XR) 24 hr capsule 120 mg  120 mg Oral Daily Nicholes Mango, MD   120 mg at 12/05/16 1950  . docusate sodium (COLACE) capsule 100 mg  100 mg Oral BID Nicholes Mango, MD   100 mg at 12/05/16 0823  . dorzolamide (TRUSOPT) 2 % ophthalmic solution 1 drop  1 drop Both Eyes BID Nicholes Mango, MD   1 drop at 12/05/16 0825  . furosemide (LASIX) tablet 40 mg  40 mg Oral Daily  Nicholes Mango, MD   40 mg at 12/05/16 0823  . insulin aspart (novoLOG) injection 0-5 Units  0-5 Units Subcutaneous QHS Aruna Gouru, MD      . insulin aspart (novoLOG) injection 0-9 Units  0-9 Units Subcutaneous TID WC Aruna Gouru, MD      . iron polysaccharides (NIFEREX) capsule 150 mg  150 mg Oral Daily Nicholes Mango, MD   150 mg at 12/05/16 3474  . latanoprost (XALATAN) 0.005 % ophthalmic solution 1 drop  1 drop Both Eyes QHS Nicholes Mango, MD   1 drop at 12/04/16 2206  . metoprolol tartrate (LOPRESSOR) tablet 12.5 mg  12.5 mg Oral BID Nicholes Mango, MD   12.5 mg at 12/05/16 2595  . morphine 2 MG/ML injection 2 mg  2 mg Intravenous Q4H PRN Nicholes Mango, MD      . ondansetron (ZOFRAN) tablet 4  mg  4 mg Oral Q6H PRN Nicholes Mango, MD       Or  . ondansetron (ZOFRAN) injection 4 mg  4 mg Intravenous Q6H PRN Aruna Gouru, MD      . pantoprazole (PROTONIX) EC tablet 40 mg  40 mg Oral Daily Nicholes Mango, MD   40 mg at 12/05/16 0823  . potassium chloride SA (K-DUR,KLOR-CON) CR tablet 40 mEq  40 mEq Oral BID Areta Haber Dunn, PA-C   40 mEq at 12/05/16 6387  . traMADol (ULTRAM) tablet 50 mg  50 mg Oral Q6H PRN Nicholes Mango, MD   50 mg at 12/05/16 5643     Discharge Medications: Please see discharge summary for a list of discharge medications.  Relevant Imaging Results:  Relevant Lab Results:   Additional Information  (SSN: [id number])  Danie Chandler, Student-Social Work

## 2016-12-05 NOTE — Progress Notes (Signed)
   Full consult note to follow. Patient has been chest pain-free and without SOB. Notes hip pain. Prior echocardiogram reviewed from 2015 that showed normal EF with mildly elevated right-side pressure. Await echo this morning. If EF is stable she would be cleared for surgery. Risk stratification pending echo.

## 2016-12-05 NOTE — Progress Notes (Signed)
*  PRELIMINARY RESULTS* Echocardiogram 2D Echocardiogram has been performed.  Sherrie Sport 12/05/2016, 9:01 AM

## 2016-12-05 NOTE — Anesthesia Preprocedure Evaluation (Addendum)
Anesthesia Evaluation    History of Anesthesia Complications Negative for: history of anesthetic complications  Airway Mallampati: III  TM Distance: >3 FB Neck ROM: limited  Mouth opening: Limited Mouth Opening  Dental  (+) Edentulous Upper, Edentulous Lower, Upper Dentures, Lower Dentures   Pulmonary shortness of breath and with exertion, neg sleep apnea, COPD,  oxygen dependent, neg recent URI, former smoker,           Cardiovascular Exercise Tolerance: Poor hypertension, On Medications (-) angina+ Peripheral Vascular Disease and +CHF  (-) CAD, (-) Past MI, (-) Cardiac Stents and (-) CABG + dysrhythmias Atrial Fibrillation (-) Valvular Problems/Murmurs     Neuro/Psych negative neurological ROS  negative psych ROS   GI/Hepatic Neg liver ROS, PUD, GERD  ,  Endo/Other  diabetes  Renal/GU negative Renal ROS  negative genitourinary   Musculoskeletal   Abdominal   Peds  Hematology  (+) Blood dyscrasia, anemia ,   Anesthesia Other Findings Past Medical History: No date: A-fib (HCC) No date: Breast cancer ([place])     Comment: right No date: CHF (congestive heart failure) (HCC) No date: Diabetes mellitus without complication (HCC) No date: Diverticulosis No date: GERD (gastroesophageal reflux disease) No date: Glaucoma No date: H/O: GI bleed No date: History of colonic polyps No date: HTN (hypertension) No date: Hyperlipidemia No date: Palpitations No date: PUD (peptic ulcer disease) No date: PVD (peripheral vascular disease) (HCC)   Reproductive/Obstetrics negative OB ROS                            Anesthesia Physical Anesthesia Plan  ASA: III  Anesthesia Plan: General   Post-op Pain Management:    Induction:   Airway Management Planned:   Additional Equipment:   Intra-op Plan:   Post-operative Plan:   Informed Consent: I have reviewed the patients History and Physical, chart,  labs and discussed the procedure including the risks, benefits and alternatives for the proposed anesthesia with the patient or authorized representative who has indicated his/her understanding and acceptance.   Dental Advisory Given  Plan Discussed with: Anesthesiologist, CRNA and Surgeon  Anesthesia Plan Comments: (Waiting on read of ECHO for cardiac clearance )       Anesthesia Quick Evaluation

## 2016-12-05 NOTE — Anesthesia Post-op Follow-up Note (Cosign Needed)
Anesthesia QCDR form completed.        

## 2016-12-05 NOTE — Clinical Social Work Note (Addendum)
Clinical Social Work Assessment  Patient Details  Name: Sharon Frazier MRN: [id number] Date of Birth: [birth year]  Date of referral:  12/05/16               Reason for consult:  Facility Placement, Discharge Planning                Permission sought to share information with:  Chartered certified accountant granted to share information::  Yes, Verbal Permission Granted  Name::      [place]::   [place]   Relationship::     Contact Information:     Housing/Transportation Living arrangements for the past 2 months:  [place] of Information:  Patient Patient Interpreter Needed:  None Criminal Activity/Legal Involvement Pertinent to Current Situation/Hospitalization:  No - Comment as needed Significant Relationships:  Adult Children, Other Family Members Lives with:  Adult Children Do you feel safe going back to the place where you live?  Yes Need for family participation in patient care:  Yes (Comment)  Care giving concerns:  Patient lives with her son Juanda Crumble and his wife in [place].    Social Worker assessment / plan:  Holiday representative ([place]) noted in patient's chart that ortho consult is pending and patient has a hip fracture. Social work Theatre manager met with patient alone at bedside. Patient was lying in bed. Patient was alert and oriented, but appeared to be confused at times. Patient answered all questions appropriately. Social work Theatre manager introduced herself and explained role of social work department. Per patient, she lives with her son Juanda Crumble and daughter-in-law in [place]. Patient's HPOA is her son Juanda Crumble. Social work Theatre manager explained that ortho consult is still pending. Social work Theatre manager explained that if patient has surgery, PT will work with patient and determine if patient needs SNF placement or can go home and receive home health. Patient verbally agreed she understood and wants to talk to son Juanda Crumble  before making any decision. Social work inern spoke to patient's son Juanda Crumble and made him aware of above. Patient's son is preferring patient to come home and receive home health if she can. Patient and patient's is agreeable to SNF search in [facility] in needed. Social work Theatre manager will continue to assist and follow as needed.   Fl2 completed and faxed out.   Employment status:  Unemployed Forensic scientist:  Environmental consultant Care) PT Recommendations:  Not assessed at this time Information / Referral to community resources:  [place]  Patient/Family's Response to care:  Patient does not have a preference of SNF or home health at this time.   Patient/Family's Understanding of and Emotional Response to Diagnosis, Current Treatment, and Prognosis:  Patient was pleasant and thanked social work Theatre manager for visiting.   Emotional Assessment Appearance:  Appears stated age Attitude/Demeanor/Rapport:    Affect (typically observed):  Accepting, Adaptable, Appropriate Orientation:  Oriented to Self, Oriented to Place, Oriented to  Time, Oriented to Situation Alcohol / Substance use:  Not Applicable Psych involvement (Current and /or in the community):  No (Comment)  Discharge Needs  Concerns to be addressed:  Basic Needs Readmission within the last 30 days:  No Current discharge risk:  Dependent with Mobility Barriers to Discharge:  Continued Medical Work up   [employer], [place] Work 12/05/2016, 10:04 AM

## 2016-12-05 NOTE — Anesthesia Procedure Notes (Signed)
Procedure Name: Intubation Date/Time: 12/05/2016 6:41 PM Performed by: Lendon Colonel Pre-anesthesia Checklist: Patient identified, Patient being monitored, Timeout performed, Emergency Drugs available and Suction available Patient Re-evaluated:Patient Re-evaluated prior to inductionOxygen Delivery Method: Circle system utilized Preoxygenation: Pre-oxygenation with 100% oxygen Intubation Type: IV induction Ventilation: Mask ventilation without difficulty Laryngoscope Size: Miller and 2 Grade View: Grade I Tube type: Oral Tube size: 7.0 mm Number of attempts: 1 Airway Equipment and Method: Stylet Placement Confirmation: ETT inserted through vocal cords under direct vision,  positive ETCO2 and breath sounds checked- equal and bilateral Secured at: 20 cm Tube secured with: Tape Dental Injury: Teeth and Oropharynx as per pre-operative assessment

## 2016-12-05 NOTE — Progress Notes (Signed)
Pt is alert. No complaints of pain during the night. Possible surgery later on today. Pt has cardiology consult pending at this time. Pt able to sleep in between care.

## 2016-12-05 NOTE — Op Note (Signed)
OPERATIVE NOTE  DATE OF SURGERY:  12/05/2016  PATIENT NAME:  Sharon Frazier   DOB: [birth year]  MRN: [id number]  PRE-OPERATIVE DIAGNOSIS: Right femoral neck fracture  POST-OPERATIVE DIAGNOSIS:  Same  PROCEDURE:  Right hip hemiarthroplasty  SURGEON:  Marciano Sequin. M.D.  ANESTHESIA: general  ESTIMATED BLOOD LOSS: 100 mL  FLUIDS REPLACED: 600 mL of crystalloid  DRAINS: 2 medium drains to a Hemovac reservoir  IMPLANTS UTILIZED: DePuy size 3 Summit femoral stem (cemented), 12 mm Cementralizer, 42 mm OD Cathcart hip ball, -3 mm tapered spacer, and a size 4 femoral cement restrictor  INDICATIONS FOR SURGERY: Sharon Frazier is a 83 y.o. year old female who fell and sustained a displaced right femoral neck fracture. After discussion of the risks and benefits of surgical intervention, the patient expressed understanding of the risks benefits and agree with plans for hip hemiarthroplasty.   The risks, benefits, and alternatives were discussed at length including but not limited to the risks of infection, bleeding, nerve injury, stiffness, blood clots, the need for revision surgery, limb length inequality, dislocation, cardiopulmonary complications, among others, and they were willing to proceed.  PROCEDURE IN DETAIL: The patient was brought into the operating room and, after adequate general anesthesia was achieved, patient was placed in a left lateral decubitus position. Axillary roll was placed and all bony prominences were well-padded. The patient's right hip was cleaned and prepped with alcohol and DuraPrep and draped in the usual sterile fashion. A "timeout" was performed as per usual protocol. A lateral curvilinear incision was made gently curving towards the posterior superior iliac spine. The IT band was incised in line with the skin incision and the fibers of the gluteus maximus were split in line. The piriformis tendon was identified, skeletonized, and incised at its insertion  to the proximal femur and reflected posteriorly. A T type posterior capsulotomy was performed. The femoral head was then removed using a corkscrew device. The femoral head was measured using calipers and ring gauges and determined to be 42 mm in diameter.The femoral neck cut was performed using an oscillating saw. The acetabulum was inspected for any bony fragments. The articular surface was in good condition.  Attention was then directed to the proximal femur. A pilot hole for preparation of the proximal femoral canal was created using a high-speed bur. The femoral canal finder was inserted followed by insertion of the conical reamer. Serial broaches were inserted up to a size 3 broach. Calcar region was planed and a trial reduction was performed using a 42 mm OD Cathcart ball with a -3 mm neck length. Good equalization of limb lengths was appreciated and excellent stability was noted both anteriorly and posteriorly. Trial components were removed. The femoral canal was sized and was felt that a size 4 cement restrictor was appropriate. The cement restrictor was inserted to the appropriate depth in the femoral canal was irrigated with copious amounts of fluid using the pulse lavage and suctioned dry. The femoral canal was then packed with vaginal packing soaked in dilute Neo-Synephrine. Polymethylmethacrylate cement was prepared in the usual fashion using a vacuum mixer. Vaginal packing was removed and the canal again irrigated and suctioned dry. The polymethylmethacrylate cement was inserted in retrograde fashion and pressurized. The size 3 Summit femoral component with a 12 mm Cementralizer was positioned and impacted into place. Excess cement was removed using [employer] fast streamer. After adequate curing of the cement, the Morse taper was cleaned and dried. A 42 mm outer diameter Cathcart  hip ball with a -3 mm tapered spacer was placed on the trunnion and impacted into place. The acetabulum was again irrigated and  suctioned dry, making sure to inspect for any residal bony debris. The femoral head was then reduced and placed through a range of motion. Excellent stability was noted both anteriorly and posteriorly. Good equalization of limb lengths was appreciated.   The wound was irrigated with copious amounts of normal saline with antibiotic solution and suctioned dry. Good hemostasis was appreciated. The posterior capsulotomy was repaired using #5 Ethibond. Piriformis tendon was reapproximated to the undersurface of the gluteus medius tendon using #5 Ethibond. Two medium drains were placed in the wound bed and brought out through separate stab incisions to be attached to a Hemovac reservoir. The IT band was reapproximated using interrupted sutures of #1 Vicryl. Subcutaneous tissue was proximal phalanx using first #0 Vicryl followed by #2-0 Vicryl. The skin was closed with skin staples.  The patient tolerated the procedure well and was transported to the recovery room in stable condition.   Marciano Sequin., M.D.

## 2016-12-05 NOTE — Clinical Social Work Placement (Signed)
   CLINICAL SOCIAL WORK PLACEMENT  NOTE  Date:  12/05/2016  Patient Details  Name: Sharon Frazier MRN: [id number] Date of Birth: 14-Feb-1922  Clinical Social Work is seeking post-discharge placement for this patient at the [place] level of care (*CSW will initial, date and re-position this form in  chart as items are completed):  Yes   Patient/family provided with [facility] Work Department's list of facilities offering this level of care within the geographic area requested by the patient (or if unable, by the patient's family).  Yes   Patient/family informed of their freedom to choose among providers that offer the needed level of care, that participate in Medicare, Medicaid or managed care program needed by the patient, have an available bed and are willing to accept the patient.  Yes   Patient/family informed of [facility]'s ownership interest in [facility] and [facility], as well as of the fact that they are under no obligation to receive care at these facilities.  PASRR submitted to EDS on       PASRR number received on       Existing PASRR number confirmed on 12/05/16     FL2 transmitted to all facilities in geographic area requested by pt/family on 12/05/16     FL2 transmitted to all facilities within larger geographic area on       Patient informed that his/her managed care company has contracts with or will negotiate with certain facilities, including the following:            Patient/family informed of bed offers received.  Patient chooses bed at       Physician recommends and patient chooses bed at      Patient to be transferred to   on  .  Patient to be transferred to facility by       Patient family notified on   of transfer.  Name of family member notified:        PHYSICIAN       Additional Comment:    _______________________________________________ Danie Chandler, [place] Work 12/05/2016,  10:39 AM

## 2016-12-05 NOTE — Progress Notes (Signed)
[facility] NAME: Sharon Frazier    MR#:  [id number]  DATE OF BIRTH:  1922/07/29  SUBJECTIVE:  CHIEF COMPLAINT:   Chief Complaint  Patient presents with  . Hip Injury   -Patient admitted with right hip pain and noted to have right subcapital femoral fracture -For surgery later today. Denies any chest pain. Rate controlled A. fib. Not in any CHF exacerbation.  REVIEW OF SYSTEMS:  Review of Systems  Constitutional: Positive for malaise/fatigue. Negative for chills and fever.  HENT: Negative for congestion, ear discharge, hearing loss and nosebleeds.   Eyes: Negative for blurred vision and double vision.  Respiratory: Negative for cough, shortness of breath and wheezing.   Cardiovascular: Negative for chest pain, palpitations and leg swelling.  Gastrointestinal: Negative for abdominal pain, constipation, diarrhea, nausea and vomiting.  Genitourinary: Negative for dysuria.  Musculoskeletal: Positive for joint pain and myalgias.  Neurological: Negative for dizziness, sensory change, speech change, focal weakness, seizures and headaches.  Psychiatric/Behavioral: Negative for depression.    DRUG ALLERGIES:   Allergies  Allergen Reactions  . Alphagan [Brimonidine]     Arm numbness      VITALS:  Blood pressure 122/77, pulse 88, temperature 98.6 F (37 C), temperature source Oral, resp. rate 16, height 5' (1.524 m), weight 42.7 kg (94 lb 3.2 oz), SpO2 100 %.  PHYSICAL EXAMINATION:  Physical Exam  GENERAL:  83 y.o.-year-old Elderly patient lying in the bed with no acute distress.  EYES: Pupils equal, round, reactive to light and accommodation. No scleral icterus. Extraocular muscles intact.  HEENT: Head atraumatic, normocephalic. Oropharynx and nasopharynx clear.  NECK:  Supple, no jugular venous distention. No thyroid enlargement, no tenderness.  LUNGS: Normal breath sounds bilaterally, no wheezing, rales,rhonchi or  crepitation. No use of accessory muscles of respiration. Decreased bibasilar breath sounds noted CARDIOVASCULAR: S1, S2 normal. No rubs, or gallops. 3/6 systolic murmur is present ABDOMEN: Soft, nontender, nondistended. Bowel sounds present. No organomegaly or mass.  EXTREMITIES: No pedal edema, cyanosis, or clubbing.  NEUROLOGIC: Cranial nerves II through XII are intact. Muscle strength 5/5 in all extremities. Sensation intact. Gait not checked. Global weakness present PSYCHIATRIC: The patient is alert and oriented x 3.  SKIN: No obvious rash, lesion, or ulcer.    LABORATORY PANEL:   CBC  Recent Labs Lab 12/05/16 0322  WBC 3.4*  HGB 8.7*  HCT 27.0*  PLT 140*   ------------------------------------------------------------------------------------------------------------------  Chemistries   Recent Labs Lab 12/05/16 0322  NA 139  K 3.2*  CL 108  CO2 24  GLUCOSE 96  BUN 43*  CREATININE 1.65*  CALCIUM 9.4  AST 23  ALT 12*  ALKPHOS 91  BILITOT 0.7   ------------------------------------------------------------------------------------------------------------------  Cardiac Enzymes No results for input(s): TROPONINI in the last 168 hours. ------------------------------------------------------------------------------------------------------------------  RADIOLOGY:  Dg Chest 1 View  Result Date: 12/04/2016 CLINICAL DATA:  Right hip fracture.  Ex-smoker. EXAM: CHEST 1 VIEW COMPARISON:  04/23/2014. FINDINGS: Grossly stable enlarged cardiac silhouette. Small to moderate amount of left lateral pleural fluid or thickening, significantly decreased. Mildly prominent interstitial markings with improvement. Thoracic aortic calcifications. Diffuse osteopenia. IMPRESSION: 1. Decreased left pleural thickening or fluid. 2. Stable cardiomegaly. 3. Aortic atherosclerosis. Electronically Signed   By: Claudie Revering M.D.   On: 12/04/2016 14:59   Dg Lumbar Spine 2-3 Views  Result Date:  12/04/2016 CLINICAL DATA:  Right hip pain.  Difficulty walking. EXAM: LUMBAR SPINE - 2-3 VIEW COMPARISON:  None FINDINGS: Degenerative  disc disease in the upper and mid lumbar spine. Degenerative set disease in the lower lumbar spine. Slight anterolisthesis of L5 on S1. No fracture. Diffuse osteopenia. Diffuse aortic and iliac calcifications. Bilateral common iliac stents noted. IMPRESSION: Degenerative disc and facet disease. Osteopenia. No acute findings. Electronically Signed   By: Rolm Baptise M.D.   On: 12/04/2016 12:23   Dg Hip Unilat W Or Wo Pelvis 2-3 Views Right  Result Date: 12/04/2016 CLINICAL DATA:  Known right hip fracture. EXAM: DG HIP (WITH OR WITHOUT PELVIS) 2-3V RIGHT COMPARISON:  None. FINDINGS: There is a subcapital right hip fracture without dislocation. IMPRESSION: Subcapital right hip fracture without dislocation. Electronically Signed   By: Dorise Bullion III M.D   On: 12/04/2016 14:58   Dg Hip Unilat W Or W/o Pelvis 2-3 Views Right  Result Date: 12/04/2016 CLINICAL DATA:  Right hip pain.  Difficulty walking. EXAM: DG HIP (WITH OR WITHOUT PELVIS) 2-3V RIGHT COMPARISON:  None. FINDINGS: There is a right femoral neck fracture with mild varus angulation. Mild degenerative changes in the hips bilaterally. Hardware noted in the left proximal femur. IMPRESSION: Right femoral neck fracture with varus angulation. Electronically Signed   By: Rolm Baptise M.D.   On: 12/04/2016 12:22    EKG:   Orders placed or performed during the hospital encounter of 12/04/16  . EKG 12-Lead  . EKG 12-Lead    ASSESSMENT AND PLAN:   83 year old female with past medical history significant for chronic atrial fibrillation not on any anticoagulation due to history of GI bleed, diastolic CHF, history of CVA, anemia, CK D presents to the hospital secondary to right hip pain and noted to have a right femoral neck fracture.  #1 right femoral neck fracture-orthopedics has been consulted. -Patient is a  moderate risk for noncardiac surgery, appreciate cardiology input. -No active cardiac symptoms. Echocardiogram has been done today. -Continue pain control. Will need DVT prophylaxis and physical therapy after her surgery. -Likely will need rehabilitation at discharge. Currently living with son and daughter-in-law.  #2 chronic atrial fibrillation-heart rate is well controlled. Not on anticoagulation as outpatient due to history of GI bleed. -Only on aspirin, which she is on hold due to anemia at this time. -Continue metoprolol and Cardizem  #3 chronic anemia-anemia of chronic disease, history of GI bleed. Iron deficiency anemia. Heparin might need transfusion after surgery is Baseline hemoglobin is low. Monitor carefully -Hold aspirin  #4 hypokalemia-being replaced  #5 CKD-stage III. Baseline creatinine around 1.8 -Better than baseline at this time. Continue to monitor closely  #6 diastolic CHF-well compensated. Follow-up echocardiogram -Continue Lasix daily.  #7 DVT prophylaxis-will be started after surgery   Might need rehabilitation at discharge.   All the records are reviewed and case discussed with Care Management/Social Workerr. Management plans discussed with the patient, family and they are in agreement.  CODE STATUS: DO NOT RESUSCITATE  TOTAL TIME TAKING CARE OF THIS PATIENT: 35 minutes.   POSSIBLE D/C IN 2-3 DAYS, DEPENDING ON CLINICAL CONDITION.   Gladstone Lighter M.D on 12/05/2016 at 1:32 PM  Between 7am to 6pm - Pager - [phone]  After 6pm go to [email or web address] - password [email or web address] [facility]  Office  [phone]  CC: Primary care physician; Sheral Flow, NP

## 2016-12-06 ENCOUNTER — Other Ambulatory Visit: Payer: Self-pay | Admitting: Cardiovascular Disease

## 2016-12-06 ENCOUNTER — Encounter: Payer: Self-pay | Admitting: Orthopedic Surgery

## 2016-12-06 LAB — BASIC METABOLIC PANEL
ANION GAP: 8 (ref 5–15)
BUN: 45 mg/dL — ABNORMAL HIGH (ref 6–20)
CALCIUM: 8.8 mg/dL — AB (ref 8.9–10.3)
CHLORIDE: 115 mmol/L — AB (ref 101–111)
CO2: 21 mmol/L — ABNORMAL LOW (ref 22–32)
Creatinine, Ser: 1.7 mg/dL — ABNORMAL HIGH (ref 0.44–1.00)
GFR calc Af Amer: 29 mL/min — ABNORMAL LOW (ref >60)
GFR calc non Af Amer: 25 mL/min — ABNORMAL LOW (ref >60)
Glucose, Bld: 89 mg/dL (ref 65–99)
POTASSIUM: 3.8 mmol/L (ref 3.5–5.1)
SODIUM: 144 mmol/L (ref 135–145)

## 2016-12-06 LAB — GLUCOSE, CAPILLARY
GLUCOSE-CAPILLARY: 80 mg/dL (ref 65–99)
Glucose-Capillary: 91 mg/dL (ref 65–99)
Glucose-Capillary: 110 mg/dL — ABNORMAL HIGH (ref 65–99)
Glucose-Capillary: 77 mg/dL (ref 65–99)

## 2016-12-06 LAB — PREPARE RBC (CROSSMATCH)

## 2016-12-06 LAB — HEMOGLOBIN A1C
HEMOGLOBIN A1C: 4.6 % — AB (ref 4.8–5.6)
MEAN PLASMA GLUCOSE: 85 mg/dL

## 2016-12-06 MED ORDER — ENOXAPARIN SODIUM 30 MG/0.3ML ~~LOC~~ SOLN
30.0000 mg | SUBCUTANEOUS | Status: DC
  Administered 2016-12-06 – 2016-12-07 (×2): 30 mg via SUBCUTANEOUS
  Filled 2016-12-06 (×2): qty 0.3

## 2016-12-06 MED ORDER — GLUCERNA SHAKE PO LIQD
237.0000 mL | Freq: Three times a day (TID) | ORAL | Status: DC
  Administered 2016-12-06 – 2016-12-07 (×3): 237 mL via ORAL

## 2016-12-06 MED ORDER — CEFAZOLIN SODIUM-DEXTROSE 2-3 GM-% IV SOLR
2.0000 g | Freq: Four times a day (QID) | INTRAVENOUS | Status: AC
  Administered 2016-12-06 (×4): 2 g via INTRAVENOUS
  Filled 2016-12-06 (×4): qty 50

## 2016-12-06 MED ORDER — CEFAZOLIN SODIUM-DEXTROSE 2-5 GM/50ML-% IV SOLN: 2000.0000 mg | Freq: Four times a day (QID) | INTRAVENOUS | Status: DC

## 2016-12-06 NOTE — Progress Notes (Signed)
   Subjective: 1 Day Post-Op Procedure(s) (LRB): ARTHROPLASTY BIPOLAR HIP (HEMIARTHROPLASTY) (Right) Patient reports pain as mild.   Patient is well, and has had no acute complaints or problems We will start therapy today.  Plan is to go Rehab after hospital stay. no nausea and no vomiting Patient denies any chest pains or shortness of breath. Slept well off and on  Objective: Vital signs in last 24 hours: Temp:  [97.3 F (36.3 C)-98.2 F (36.8 C)] 98.2 F (36.8 C) (03/14 0743) Pulse Rate:  [25-81] 76 (03/14 0743) Resp:  [14-19] 16 (03/14 0743) BP: (81-114)/(28-66) 90/65 (03/14 0743) SpO2:  [95 %-100 %] 99 % (03/14 0743) Weight:  [46.9 kg (103 lb 4.8 oz)] 46.9 kg (103 lb 4.8 oz) (03/14 0337) well approximated incision Heels are non tender and elevated off the bed using rolled towels Intake/Output from previous day: 03/13 0701 - 03/14 0700 In: 2337.5 [P.O.:120; I.V.:1717.5; IV Piggyback:50] Out: 970 [Urine:750; Drains:120; Blood:100] Intake/Output this shift: No intake/output data recorded.   Recent Labs  12/04/16 1410 12/05/16 0322  HGB 10.2* 8.7*    Recent Labs  12/04/16 1410 12/05/16 0322  WBC 6.9 3.4*  RBC 3.79* 3.25*  HCT 32.2* 27.0*  PLT 171 140*    Recent Labs  12/05/16 0322 12/06/16 0511  NA 139 144  K 3.2* 3.8  CL 108 115*  CO2 24 21*  BUN 43* 45*  CREATININE 1.65* 1.70*  GLUCOSE 96 89  CALCIUM 9.4 8.8*   No results for input(s): LABPT, INR in the last 72 hours.  EXAM General - Patient is Alert, Appropriate and Oriented Extremity - Neurologically intact Neurovascular intact Sensation intact distally Intact pulses distally Dorsiflexion/Plantar flexion intact Compartment soft Dressing - dressing C/D/I Motor Function - intact, moving foot and toes well on exam.    Past Medical History:  Diagnosis Date  . A-fib ([place])   . Breast cancer ([place])    right  . CHF (congestive heart failure) ([place])   . Diabetes mellitus without complication  ([place])   . Diverticulosis   . GERD (gastroesophageal reflux disease)   . Glaucoma   . H/O: GI bleed   . History of colonic polyps   . HTN (hypertension)   . Hyperlipidemia   . Palpitations   . PUD (peptic ulcer disease)   . PVD (peripheral vascular disease) ([facility])     Assessment/Plan: 1 Day Post-Op Procedure(s) (LRB): ARTHROPLASTY BIPOLAR HIP (HEMIARTHROPLASTY) (Right) Active Problems:   Closed right hip fracture ([facility])  Estimated body mass index is 20.17 kg/m as calculated from the following:   Height as of this encounter: 5' (1.524 m).   Weight as of this encounter: 46.9 kg (103 lb 4.8 oz). Advance diet Up with therapy D/C IV fluids Plan for discharge tomorrow Discharge to SNF  Labs: reviewed and acceptable . hgb 8.7 DVT Prophylaxis - Lovenox, Foot Pumps and TED hose Weight-Bearing as tolerated to right leg D/C O2 and Pulse OX and try on Room Air Begin working on a bowel movement  [name] R. [place] 12/06/2016, 7:49 AM

## 2016-12-06 NOTE — Progress Notes (Signed)
Pharmacy renal dose adjustment for Lovenox 40 mg daily. Patient's CrCl < 30 ml/min, which requires dose adjustment to Lovenox 30 mg daily.  Will initiate Lovenox 30 mg daily for DVT.  Tobie Lords, PharmD, BCPS Clinical Pharmacist 12/06/2016

## 2016-12-06 NOTE — Progress Notes (Signed)
Physical Therapy Treatment Patient Details Name: Sharon Frazier MRN: [id number] DOB: [birth year] Today's Date: 12/06/2016    History of Present Illness presented to ER with 3 day history of R hip pain; admitted with R subcapital femoral fracture, status post R hip hemiarthroplasty (3/13), WBAT.    PT Comments    Progressive increase in gait distance; constant cuing for walker position/management (tends to maintain too close to body).  Tendency towards posterior LOB, min assist for safety with all functional activities.   Follow Up Recommendations  SNF     Equipment Recommendations       Recommendations for Other Services       Precautions / Restrictions Precautions Precautions: Fall;Posterior Hip Precaution Booklet Issued: No Restrictions Weight Bearing Restrictions: Yes RLE Weight Bearing: Weight bearing as tolerated    Mobility  Bed Mobility Overal bed mobility: Needs Assistance Bed Mobility: Sit to Supine       Sit to supine: Mod assist;Max assist   General bed mobility comments: assist for position and elevation of bilat LEs  Transfers Overall transfer level: Needs assistance Equipment used: Rolling walker (2 wheeled) Transfers: Sit to/from Stand Sit to Stand: Min assist         General transfer comment: cuing for hand placement  Ambulation/Gait Ambulation/Gait assistance: Min assist Ambulation Distance (Feet): 30 Feet Assistive device: Rolling walker (2 wheeled)       General Gait Details: step to gait pattern with fair/good stance time R LE; slow and deliberate; constant cuing/assist for walker position and management   Stairs            Wheelchair Mobility    Modified Rankin (Stroke Patients Only)       Balance Overall balance assessment: Needs assistance Sitting-balance support: No upper extremity supported;Feet supported Sitting balance-Leahy Scale: Good     Standing balance support: Bilateral upper extremity  supported Standing balance-Leahy Scale: Fair                      Cognition Arousal/Alertness: Awake/alert Behavior During Therapy: WFL for tasks assessed/performed Overall Cognitive Status: Within Functional Limits for tasks assessed                 General Comments: Follows simple commands; limited insight into deficits and current situation    Exercises Other Exercises Other Exercises: Sit/stand from recliner, BSC and edge of bed with RW, min assist; consistent cuing for hand placement.  Minimal change in performance, level of assist despite change in seating surface. Other Exercises: Toilet transfer, ambulatory with RW, min assist; sit/stand from [facility], min assist; standing balance during hygiene with RW, min assist.    General Comments        Pertinent Vitals/Pain Pain Assessment: Faces Faces Pain Scale: Hurts little more Pain Location: R hip Pain Descriptors / Indicators: Aching;Guarding Pain Intervention(s): Limited activity within patient's tolerance;Monitored during session;Repositioned    Home Living                      Prior Function            PT Goals (current goals can now be found in the care plan section) Acute Rehab PT Goals Patient Stated Goal: to get better PT Goal Formulation: With patient Time For Goal Achievement: 12/20/16 Potential to Achieve Goals: Good Progress towards PT goals: Progressing toward goals    Frequency    Min 2X/week      PT Plan Current plan remains appropriate  Co-evaluation             End of Session Equipment Utilized During Treatment: Gait belt Activity Tolerance: Patient tolerated treatment well Patient left: in bed;with call bell/phone within reach;with bed alarm set   PT Visit Diagnosis: Pain;Muscle weakness (generalized) (M62.81);Difficulty in walking, not elsewhere classified (R26.2) Pain - Right/Left: Right Pain - part of body: Hip     Time: 1330-1400 PT Time Calculation  (min) (ACUTE ONLY): 30 min  Charges:  $Gait Training: 8-22 mins $Therapeutic Activity: 8-22 mins                    G Codes:       Jennae Hakeem H. Owens Shark, PT, DPT, NCS 12/06/16, 3:38 PM [phone]

## 2016-12-06 NOTE — Progress Notes (Signed)
PT is recommending SNF. Clinical Social Worker (CSW) met with patient and presented SNF bed offers. Patient reported that she would like to talk to her son Sharon Frazier about the bed offers. CSW left Sharon Frazier a Advertising account executive. CSW left list of bed offers in patient's room. CSW will continue to follow and assist as needed.   [employer], LCSW [phone]

## 2016-12-06 NOTE — Evaluation (Signed)
Physical Therapy Evaluation Patient Details Name: Sharon Frazier MRN: [id number] DOB: October 05, 1921 Today's Date: 12/06/2016   History of Present Illness  presented to ER with 3 day history of R hip pain; admitted with R subcapital femoral fracture, status post R hip hemiarthroplasty (3/13), WBAT.  Clinical Impression  Upon evaluation, patient alert and oriented to basic information; follows all commands, but demonstrates limited insight into deficits.  R LE generally limited in strength and ROM due to post-op pain and movement precautions; however, generally appropriate for functional activities.  Currently requiring assist for bed mobility; min assist for sit/stand, basic transfers and short-distance gait (5') with RW.  Good WBing R LE with minimal increase in pain reported. Would benefit from skilled PT to address above deficits and promote optimal return to PLOF; recommend transition to STR upon discharge from acute hospitalization.     Follow Up Recommendations SNF    Equipment Recommendations  Rolling walker with 5" wheels    Recommendations for Other Services       Precautions / Restrictions Precautions Precautions: Fall;Posterior Hip Precaution Booklet Issued: No Restrictions Weight Bearing Restrictions: Yes RLE Weight Bearing: Weight bearing as tolerated Other Position/Activity Restrictions: RLE WBAT      Mobility  Bed Mobility Overal bed mobility: Needs Assistance Bed Mobility: Supine to Sit     Supine to sit: Mod assist     General bed mobility comments: assist for LE management and truncal elevation, dep cuing for R THPs  Transfers Overall transfer level: Needs assistance Equipment used: Rolling walker (2 wheeled) Transfers: Sit to/from Stand Sit to Stand: Min assist         General transfer comment: cuing for hand placement  Ambulation/Gait Ambulation/Gait assistance: Min assist Ambulation Distance (Feet): 5 Feet Assistive device: Rolling walker  (2 wheeled)       General Gait Details: step to gait pattern with good stance time/weight acceptance R LE; minimal increase in pain with activity.  Additional distance deferred secondary to breakfast arrived.  Stairs            Wheelchair Mobility    Modified Rankin (Stroke Patients Only)       Balance Overall balance assessment: Needs assistance Sitting-balance support: No upper extremity supported;Feet supported Sitting balance-Leahy Scale: Good     Standing balance support: Bilateral upper extremity supported Standing balance-Leahy Scale: Fair                               Pertinent Vitals/Pain Pain Assessment: Faces Faces Pain Scale: Hurts little more Pain Location: R hip Pain Descriptors / Indicators: Aching;Guarding Pain Intervention(s): Limited activity within patient's tolerance;Monitored during session;Premedicated before session;Repositioned    Home Living Family/patient expects to be discharged to:: Private residence Living Arrangements: Children (son and daughter-in-law) Available Help at Discharge: Family Type of Home: House Home Access: Stairs to enter   [employer] of Steps: pt reports level entry, but 1 step inside from living room to bedroom Home Layout: One level Home Equipment: Environmental consultant - 2 wheels;Adaptive equipment;Toilet riser;Bedside commode      Prior Function Level of Independence: Needs assistance   Gait / Transfers Assistance Needed: pt ambulates with RW at baseline, per pt report; unclear how much assist needed for transfers as no family present at time of evaluation  ADL's / Homemaking Assistance Needed: Pt reports requiring assist for bathing x1/wk by niece, able to dress with set up of clothing but difficulty with socks/shoes but  unable to clearly report assist needed; son managed medications for pt  Comments: Mod indep for household mobility; assist from niece for ADLs (full bath/shower at least 2x/week)      Hand Dominance   Dominant Hand: Right    Extremity/Trunk Assessment   Upper Extremity Assessment Upper Extremity Assessment: Overall WFL for tasks assessed    Lower Extremity Assessment Lower Extremity Assessment: Generalized weakness (R LE grossly 3-/5 secondary to post-op guarding/pain; L LE grossly WFL.  Full sensation intact)       Communication   Communication: HOH  Cognition Arousal/Alertness: Awake/alert Behavior During Therapy: WFL for tasks assessed/performed Overall Cognitive Status: Within Functional Limits for tasks assessed                 General Comments: Follows simple commands; limited insight into deficits and current situation    General Comments      Exercises Other Exercises Other Exercises: Supine LE therex, 1x10, AROM for muscular strength/endurance in R LE: ankle pumps, quad sets, SAQs, heel slides, hip abduct/adduct.   Assessment/Plan    PT Assessment Patient needs continued PT services  PT Problem List Decreased strength;Decreased range of motion;Decreased activity tolerance;Decreased balance;Decreased mobility;Decreased coordination;Decreased knowledge of use of DME;Decreased safety awareness;Decreased knowledge of precautions;Pain       PT Treatment Interventions DME instruction;Gait training;Stair training;Functional mobility training;Therapeutic activities;Therapeutic exercise;Balance training;Patient/family education    PT Goals (Current goals can be found in the Care Plan section)  Acute Rehab PT Goals Patient Stated Goal: to get better PT Goal Formulation: With patient Time For Goal Achievement: 12/20/16 Potential to Achieve Goals: Good    Frequency Min 2X/week   Barriers to discharge Decreased caregiver support      Co-evaluation               End of Session Equipment Utilized During Treatment: Gait belt Activity Tolerance: Patient tolerated treatment well Patient left: in chair;with call bell/phone within  reach;with chair alarm set Nurse Communication: Mobility status PT Visit Diagnosis: Pain;Muscle weakness (generalized) (M62.81);Difficulty in walking, not elsewhere classified (R26.2) Pain - Right/Left: Right Pain - part of body: Hip         Time: 1165-7903 PT Time Calculation (min) (ACUTE ONLY): 23 min   Charges:   PT Evaluation $PT Eval Low Complexity: 1 Procedure PT Treatments $Therapeutic Exercise: 8-22 mins   PT G Codes:         Chesley Veasey H. Owens Shark, PT, DPT, NCS 12/06/16, 11:19 AM [phone]

## 2016-12-06 NOTE — Progress Notes (Signed)
Pts. Blood pressure has been running low since she arrived from surgery. Pt. Asymptomatic. Dr. Marcille Blanco notified and no new orders at this time.

## 2016-12-06 NOTE — Progress Notes (Signed)
[place] [facility] [place] NAME: Sharon Frazier    MR#:  [id number]  DATE OF BIRTH:  September 11, 1922  SUBJECTIVE:  CHIEF COMPLAINT:   Chief Complaint  Patient presents with  . Hip Injury   -Patient admitted with right hip pain and noted to have right subcapital femoral fracture -Had surgery yesterday. Very hard of hearing but pain is okay, no bowel movements yet  REVIEW OF SYSTEMS:  Review of Systems  Constitutional: Positive for malaise/fatigue. Negative for chills and fever.  HENT: Negative for congestion, ear discharge, hearing loss and nosebleeds.   Eyes: Negative for blurred vision and double vision.  Respiratory: Negative for cough, shortness of breath and wheezing.   Cardiovascular: Negative for chest pain, palpitations and leg swelling.  Gastrointestinal: Negative for abdominal pain, constipation, diarrhea, nausea and vomiting.  Genitourinary: Negative for dysuria.  Musculoskeletal: Positive for joint pain. Negative for myalgias.  Neurological: Negative for dizziness, sensory change, speech change, focal weakness, seizures and headaches.  Psychiatric/Behavioral: Negative for depression.    DRUG ALLERGIES:   Allergies  Allergen Reactions  . Alphagan [Brimonidine]     Arm numbness      VITALS:  Blood pressure (!) 98/47, pulse 71, temperature 97.9 F (36.6 C), temperature source Oral, resp. rate 14, height 5' (1.524 m), weight 46.9 kg (103 lb 4.8 oz), SpO2 98 %.  PHYSICAL EXAMINATION:  Physical Exam  GENERAL:  83 y.o.-year-old Elderly patient lying in the bed with no acute distress.  EYES: Pupils equal, round, reactive to light and accommodation. No scleral icterus. Extraocular muscles intact.  HEENT: Head atraumatic, normocephalic. Oropharynx and nasopharynx clear.  NECK:  Supple, no jugular venous distention. No thyroid enlargement, no tenderness.  LUNGS: Normal breath sounds bilaterally, no wheezing, rales,rhonchi or  crepitation. No use of accessory muscles of respiration. Decreased bibasilar breath sounds noted CARDIOVASCULAR: S1, S2 normal. No rubs, or gallops. 3/6 systolic murmur is present ABDOMEN: Soft, nontender, nondistended. Bowel sounds present. No organomegaly or mass.  EXTREMITIES: Right hip status post surgery, tender no bleeding  NEUROLOGIC: Cranial nerves II through XII are intact. Muscle strength 5/5 in all extremities. Sensation intact. Gait not checked. Global weakness present PSYCHIATRIC: The patient is alert and oriented x 3.  SKIN: No obvious rash, lesion, or ulcer.    LABORATORY PANEL:   CBC  Recent Labs Lab 12/05/16 0322  WBC 3.4*  HGB 8.7*  HCT 27.0*  PLT 140*   ------------------------------------------------------------------------------------------------------------------  Chemistries   Recent Labs Lab 12/05/16 0322 12/06/16 0511  NA 139 144  K 3.2* 3.8  CL 108 115*  CO2 24 21*  GLUCOSE 96 89  BUN 43* 45*  CREATININE 1.65* 1.70*  CALCIUM 9.4 8.8*  AST 23  --   ALT 12*  --   ALKPHOS 91  --   BILITOT 0.7  --    ------------------------------------------------------------------------------------------------------------------  Cardiac Enzymes No results for input(s): TROPONINI in the last 168 hours. ------------------------------------------------------------------------------------------------------------------  RADIOLOGY:  Dg Hip Port Unilat With Pelvis 1v Right  Result Date: 12/05/2016 CLINICAL DATA:  Status post right hip hemiarthroplasty. EXAM: DG HIP (WITH OR WITHOUT PELVIS) 1V PORT RIGHT COMPARISON:  Pelvic radiograph 12/04/2016 FINDINGS: The patient has undergone hemiarthroplasty of the right hip. The prosthetic components are well seated without abnormal surrounding lucency. There is soft tissue gas at the operative site. The appearance of the left hip and associated hardware is unchanged. IMPRESSION: Right hip hemiarthroplasty without hardware  abnormality. Electronically Signed   By: Lennette Bihari  Collins [place] M.D.   On: 12/05/2016 22:46    EKG:   Orders placed or performed during the hospital encounter of 12/04/16  . EKG 12-Lead  . EKG 12-Lead    ASSESSMENT AND PLAN:   83 year old female with past medical history significant for chronic atrial fibrillation not on any anticoagulation due to history of GI bleed, diastolic CHF, history of CVA, anemia, CK D presents to the hospital secondary to right hip pain and noted to have a right femoral neck fracture.  #1 right femoral neck fracture-post op day #1 Tolerated procedure well. Pain is manageable Follow-up with orthopedics -Patient is a moderate risk for noncardiac surgery, appreciate cardiology input. -Echocardiogram-55-70% left ventricular ejection fraction -Continue pain control. PT is recommending skilled nursing facility-  #2 chronic atrial fibrillation-heart rate is well controlled. Not on anticoagulation as outpatient due to history of GI bleed. -Only on aspirin, which is on hold due to anemia at this time. -Continue metoprolol and Cardizem  #3 chronic anemia-anemia of chronic disease, history of GI bleed. Iron deficiency anemia. 1 unit blood transfusion. Check CBC in a.m. -Hold aspirin  #4 hypokalemia-being replaced  #5 CKD-stage III. Baseline creatinine around 1.8 -Better than baseline at this time. Continue to monitor closely  #6 diastolic CHF-well compensated. Follow-up echocardiogram -Continue Lasix daily.  #7 DVT prophylaxis-Lovenox 30 mg subcutaneous daily basis   PT recommends skilled nursing facility follow-up with social worker for placement   All the records are reviewed and case discussed with Care Management/Social Workerr. Management plans discussed with the patient, family and they are in agreement.  CODE STATUS: DO NOT RESUSCITATE  TOTAL TIME TAKING CARE OF THIS PATIENT: 35 minutes.   POSSIBLE D/C IN 1-2 DAYS, DEPENDING ON CLINICAL  CONDITION.   Nicholes Mango M.D on 12/06/2016 at 4:53 PM  Between 7am to 6pm - Pager - [phone]  After 6pm go to [email or web address] - password [email or web address] [place] [facility]  Office  [phone]  CC: Primary care physician; Sheral Flow, NP

## 2016-12-06 NOTE — Progress Notes (Signed)
Initial Nutrition Assessment  DOCUMENTATION CODES:   Severe malnutrition in context of chronic illness  INTERVENTION:  1. Glucerna Shake po TID, each supplement provides 220 kcal and 10 grams of protein  NUTRITION DIAGNOSIS:   Malnutrition related to chronic illness as evidenced by severe depletion of muscle mass, severe depletion of body fat.  GOAL:   Patient will meet greater than or equal to 90% of their needs  MONITOR:   PO intake, I & O's, Labs, Weight trends, Supplement acceptance  REASON FOR ASSESSMENT:   Other (Comment) (low BMI)    ASSESSMENT:   Sharon Frazier  is a 83 y.o. female with a known history of Chronic atrial fibrillation, on aspirin, diabetes mellitus, hypertension and hyperlipidemia is presenting to the ED with a chief complaint of worsening of right hip pain for the past 3 days.  Spoke with patient at bedside. She did not seem to be responding appropriately when I asked her questions, and was slow to respond. Stated she does not like to eat - can't taste anything. She was unsure of her daily PO intake. Unsure of her weight. Per chart she exhibits a 13# wt gain over the last month. Nutrition-Focused physical exam completed. Findings are severe fat depletion, severe muscle depletion, and no edema.   Labs and medications reviewed: Iron Sliding Scale Insulin, Reglan, Senokot-S NS @ 3mL/hr  Diet Order:  Diet heart healthy/carb modified Room service appropriate? Yes; Fluid consistency: Thin  Skin:  Reviewed, no issues  Last BM:  12/05/2016  Height:   Ht Readings from Last 1 Encounters:  12/04/16 5' (1.524 m)    Weight:   Wt Readings from Last 1 Encounters:  12/06/16 103 lb 4.8 oz (46.9 kg)    Ideal Body Weight:  45.45 kg  BMI:  Body mass index is 20.17 kg/m.  Estimated Nutritional Needs:   Kcal:  1100-1404 calories  Protein:  47-56 gm  Fluid:  >/= 1.1L  EDUCATION NEEDS:   No education needs identified at this time  Satira Anis. Mayes Sangiovanni, MS, RD LDN Inpatient Clinical Dietitian Pager [phone]

## 2016-12-06 NOTE — Care Management Note (Addendum)
Case Management Note  Patient Details  Name: Sharon Frazier MRN: [id number] Date of Birth: [birth year]  Subjective/Objective:                  Met briefly with patient however she is very hard of hearing and OT was working with her. She did tell me uses [facility] [phone]  for her medications. I contacted her DIL by phone to discuss further. She was able to tell me that patient resides with her and patient's son Sharon Frazier. She did not know if [name]/patient will agree to SNF which is recommended by PT. Received callback from patient's son Sharon Frazier. He wants to talk to patient first about rehab at [facility]. He states she has front-wheeled walker and bedside commode at home. PCP Dr. Alma Friendly with [facility].   Action/Plan: Home health list left at bedside with this RNCM contact card. Sharon Frazier is willing to learn lovenox injections if patient returns home. He will review home health list provided. RNCM to follow up with patient/family.    Expected Discharge Date:                  Expected Discharge Plan:     In-House Referral:     Discharge planning Services  CM Consult  Post Acute Care Choice:  Durable Medical Equipment, Home Health Choice offered to:  Adult Children, Patient  DME Arranged:    DME Agency:     HH Arranged:    HH Agency:     Status of Service:  In process, will continue to follow  If discussed at Long Length of Stay Meetings, dates discussed:    Additional Comments:  Marshell Garfinkel, RN 12/06/2016, 11:43 AM

## 2016-12-06 NOTE — Progress Notes (Signed)
Clinical Education officer, museum (CSW) met with patient's son Sharon Frazier and his wife at patient's bedside and presented SNF bed offers. Son is agreeable to SNF and chose [facility]. CSW left [facility] admissions coordinator at [facility] a voicemail making her aware of accepted bed offer. CSW will continue to follow and assist as needed.   [employer], LCSW [phone]

## 2016-12-06 NOTE — Progress Notes (Signed)
Foley d/c'd at [employer] [employer]

## 2016-12-06 NOTE — Evaluation (Signed)
Occupational Therapy Evaluation Patient Details Name: Sharon Frazier MRN: [id number] DOB: [birth year] Today's Date: 12/06/2016    History of Present Illness 83yo female pt admitted with R hip fx and s/p R hip hemiarthroplasty with PMHx significant for A-fib, breast cancer (R), CHF, DMII, diverticulitis, GERD, glaucoma, hx of GI bleed, hx of colon polyps, HTN, HLD, PUD, PVD.   Clinical Impression   Pt seen for OT evaluation this date. Pt presents with significant pain, difficulty verbalizing amount of pain. Difficult to determine baseline cognitive functioning as no family members present during evaluation. Pt mildly confused at times, when asked if she wanted me to check with the RN regarding pain mgt, pt looked concerned and stated "I don't want you to fall." Pt very hard of hearing, no hearing aids, improved communication with slow, deep voice used by therapist. Pt reports ambulating with RW at baseline, requiring assist for bathing 1x/week, and able to "for the most part" get dressed if her clothes are laid out for her. Pt reports son manages her medications for her and she reports staying in her bedroom in bed most of the time and reports buttocks pain has been there for "a while". Pt reports no falls in past 12 months. Pt presents with significant pain, decreased strength, activity tolerance, ROM, and need for increased assist for self care tasks. Pt will benefit from skilled OT services to address noted impairments and functional deficits in order to maximize return to PLOF and minimize falls risk.     Follow Up Recommendations  SNF    Equipment Recommendations  None recommended by OT    Recommendations for Other Services       Precautions / Restrictions Precautions Precautions: Posterior Hip Precaution Booklet Issued: No Restrictions Weight Bearing Restrictions: Yes Other Position/Activity Restrictions: RLE WBAT      Mobility Bed Mobility               General bed  mobility comments: did not assess - pt up in recliner at start of session  Transfers                 General transfer comment: deferred this session due to pt pain, fatigue     Balance                                            ADL Overall ADL's : Needs assistance/impaired Eating/Feeding: Set up;Sitting Eating/Feeding Details (indicate cue type and reason): Noted breakfast of broth, juice, and jello; pt reports wearing dentures, did not see dentures in room Grooming: Sitting;Set up;Wash/dry face   Upper Body Bathing: Moderate assistance;Bed level   Lower Body Bathing: Maximal assistance;Bed level   Upper Body Dressing : Set up;Minimal assistance;Sitting   Lower Body Dressing: Maximal assistance;Sit to/from Health and safety inspector Details (indicate cue type and reason): did not attempt this session for pt safety           General ADL Comments: Pt limited by pain, fatigue, decreased strength, clinical judgment indicating max A for LB ADL     Vision Baseline Vision/History: Wears glasses;Glaucoma Wears Glasses: At all times Patient Visual Report: No change from baseline Vision Assessment?: No apparent visual deficits Additional Comments: able to read information off board in room with no difficulties with glasses on     Perception     [place] tested?:  Within functional limits    Pertinent Vitals/Pain Pain Assessment: Faces Faces Pain Scale: Hurts whole lot Pain Location: buttocks Pain Descriptors / Indicators: Grimacing Pain Intervention(s): Monitored during session;Patient requesting pain meds-RN notified     Hand Dominance Right   Extremity/Trunk Assessment Upper Extremity Assessment Upper Extremity Assessment: Generalized weakness (3/5 bilaterally, ROM WFL)   Lower Extremity Assessment Lower Extremity Assessment: Defer to PT evaluation       Communication Communication Communication: HOH   Cognition  Arousal/Alertness: Awake/alert Behavior During Therapy: Flat affect Overall Cognitive Status: No family/caregiver present to determine baseline cognitive functioning                 General Comments: Slightly confused at times, disoriented to date/time, poor historian, HOH, difficult to determine baseline functional level   General Comments       Exercises       Shoulder Instructions      Home Living Family/patient expects to be discharged to:: Private residence Living Arrangements: Children (lives w/ son and dtr in law per pt report) Available Help at Discharge: Family (unclear as to extent family is available) Type of Home: House Home Access: Stairs to enter [employer] of Steps: pt reports level entry, but 1 step inside from living room to bedroom   Home Layout: One level     Bathroom Shower/Tub: Teacher, early years/pre: Standard (pt reports "cushion" on toilet) Bathroom Accessibility:  (unsure, but pt using RW at baseline)   Home Equipment: Walker - 2 wheels;Adaptive equipment;Toilet riser;Bedside commode Adaptive Equipment: Sock aid        Prior Functioning/Environment Level of Independence: Needs assistance  Gait / Transfers Assistance Needed: pt ambulates with RW at baseline, per pt report; unclear how much assist needed for transfers as no family present at time of evaluation ADL's / Homemaking Assistance Needed: Pt reports requiring assist for bathing x1/wk by niece, able to dress with set up of clothing but difficulty with socks/shoes but unable to clearly report assist needed; son managed medications for pt            OT Problem List: Decreased strength;Pain;Decreased cognition;Decreased safety awareness;Decreased activity tolerance;Decreased knowledge of use of DME or AE      OT Treatment/Interventions: Self-care/ADL training;Therapeutic exercise;Energy conservation;Patient/family education;DME and/or AE instruction    OT  Goals(Current goals can be found in the care plan section) Acute Rehab OT Goals Patient Stated Goal: feel better OT Goal Formulation: With patient Time For Goal Achievement: 12/20/16 Potential to Achieve Goals: Good  OT Frequency: Min 1X/week   Barriers to D/C: Inaccessible home environment;Decreased caregiver support  1 step inside home from bedroom to living room per pt report; unsure of baseline caregiver support provided or availability at discharge, no family present during evaluation       Co-evaluation              End of Session Nurse Communication: Patient requests pain meds  Activity Tolerance: Patient limited by pain;Patient limited by fatigue Patient left: in chair;with call bell/phone within reach;with chair alarm set  OT Visit Diagnosis: Other abnormalities of gait and mobility (R26.89);Muscle weakness (generalized) (M62.81);Pain;Other symptoms and signs involving cognitive function Pain - Right/Left: Right Pain - part of body: Hip (and buttocks)                ADL either performed or assessed with clinical judgement  Time: 1448-1856 OT Time Calculation (min): 21 min Charges:  OT General Charges $OT Visit: 1 Procedure OT  Evaluation $OT Eval Moderate Complexity: 1 Procedure OT Treatments $Self Care/Home Management : 8-22 mins G-Codes:     Jeni Salles, MPH, MS, OTR/L ascom [phone] 12/06/16, 10:38 AM

## 2016-12-07 ENCOUNTER — Telehealth: Payer: Self-pay | Admitting: Cardiovascular Disease

## 2016-12-07 LAB — CBC
HEMATOCRIT: 28.2 % — AB (ref 35.0–47.0)
Hemoglobin: 8.8 g/dL — ABNORMAL LOW (ref 12.0–16.0)
MCH: 27 pg (ref 26.0–34.0)
MCHC: 31 g/dL — ABNORMAL LOW (ref 32.0–36.0)
MCV: 87.1 fL (ref 80.0–100.0)
PLATELETS: 158 10*3/uL (ref 150–440)
RBC: 3.24 MIL/uL — AB (ref 3.80–5.20)
RDW: 25.8 % — AB (ref 11.5–14.5)
WBC: 6.2 10*3/uL (ref 3.6–11.0)

## 2016-12-07 LAB — BASIC METABOLIC PANEL
ANION GAP: 7 (ref 5–15)
BUN: 44 mg/dL — ABNORMAL HIGH (ref 6–20)
CALCIUM: 8.8 mg/dL — AB (ref 8.9–10.3)
CO2: 21 mmol/L — ABNORMAL LOW (ref 22–32)
Chloride: 110 mmol/L (ref 101–111)
Creatinine, Ser: 1.66 mg/dL — ABNORMAL HIGH (ref 0.44–1.00)
GFR, EST AFRICAN AMERICAN: 30 mL/min — AB (ref >60)
GFR, EST NON AFRICAN AMERICAN: 26 mL/min — AB (ref >60)
Glucose, Bld: 114 mg/dL — ABNORMAL HIGH (ref 65–99)
POTASSIUM: 3.9 mmol/L (ref 3.5–5.1)
Sodium: 138 mmol/L (ref 135–145)

## 2016-12-07 LAB — GLUCOSE, CAPILLARY
GLUCOSE-CAPILLARY: 102 mg/dL — AB (ref 65–99)
Glucose-Capillary: 130 mg/dL — ABNORMAL HIGH (ref 65–99)

## 2016-12-07 MED ORDER — ACETAMINOPHEN 325 MG PO TABS: 650.0000 mg | ORAL_TABLET | Freq: Four times a day (QID) | ORAL | Status: DC | PRN

## 2016-12-07 MED ORDER — OXYCODONE HCL 5 MG PO TABS: 5.0000 mg | ORAL_TABLET | ORAL | 0 refills | Status: DC | PRN

## 2016-12-07 MED ORDER — GLUCERNA SHAKE PO LIQD: 237.0000 mL | Freq: Three times a day (TID) | ORAL | 0 refills | Status: DC

## 2016-12-07 MED ORDER — INSULIN ASPART 100 UNIT/ML ~~LOC~~ SOLN: 0.0000 [IU] | Freq: Every day | SUBCUTANEOUS | 11 refills | Status: DC

## 2016-12-07 MED ORDER — METOCLOPRAMIDE HCL 10 MG PO TABS
10.0000 mg | ORAL_TABLET | Freq: Three times a day (TID) | ORAL | 0 refills | Status: AC
Start: 2016-12-07 — End: ?

## 2016-12-07 MED ORDER — SENNOSIDES-DOCUSATE SODIUM 8.6-50 MG PO TABS: 1.0000 | ORAL_TABLET | Freq: Two times a day (BID) | ORAL | Status: AC

## 2016-12-07 MED ORDER — INSULIN ASPART 100 UNIT/ML ~~LOC~~ SOLN: 0.0000 [IU] | Freq: Three times a day (TID) | SUBCUTANEOUS | 11 refills | Status: DC

## 2016-12-07 MED ORDER — ENOXAPARIN SODIUM 30 MG/0.3ML ~~LOC~~ SOLN: 30.0000 mg | SUBCUTANEOUS | 0 refills | Status: AC

## 2016-12-07 NOTE — Progress Notes (Signed)
Physical Therapy Treatment Patient Details Name: Sharon Frazier MRN: [id number] DOB: [birth year] Today's Date: 12/07/2016    History of Present Illness presented to ER with 3 day history of R hip pain; admitted with R subcapital femoral fracture, status post R hip hemiarthroplasty (3/13), WBAT.    PT Comments    Good response to cuing/education from therapist with all functional activities.  Constant cuing/assist for awareness and adherence to R LE THPs.  Demonstrates progressive increase in gait distance, activity tolerance; limited by pain/fatigue with effort. Remains generally unsteady with increased fall risk evident (delayed balance reactions); continue to recommend +1 at all times.    Follow Up Recommendations  SNF     Equipment Recommendations  Rolling walker with 5" wheels    Recommendations for Other Services       Precautions / Restrictions Precautions Precautions: Fall;Posterior Hip Precaution Booklet Issued: No Restrictions Weight Bearing Restrictions: Yes RLE Weight Bearing: Weight bearing as tolerated    Mobility  Bed Mobility Overal bed mobility: Needs Assistance Bed Mobility: Supine to Sit     Supine to sit: Mod assist     General bed mobility comments: lacks adequate UE strength to initiate/complete truncal elevation  Transfers Overall transfer level: Needs assistance Equipment used: Rolling walker (2 wheeled) Transfers: Sit to/from Stand Sit to Stand: Min assist         General transfer comment: cuing for hand placement, assist to maintain neutral R LE position (tendancy towards R LE IR)  Ambulation/Gait Ambulation/Gait assistance: Min assist Ambulation Distance (Feet): 45 Feet Assistive device: Rolling walker (2 wheeled)       General Gait Details: step to progressing to step through gait pattern; min cuing for increased R LE step length and increased cadence.  Constant instruction for R LE position with turn negotiation to avoid IR  in closed-chain position   Stairs            Wheelchair Mobility    Modified Rankin (Stroke Patients Only)       Balance Overall balance assessment: Needs assistance Sitting-balance support: No upper extremity supported;Feet supported Sitting balance-Leahy Scale: Good     Standing balance support: Bilateral upper extremity supported Standing balance-Leahy Scale: Fair                      Cognition Arousal/Alertness: Awake/alert Behavior During Therapy: WFL for tasks assessed/performed Overall Cognitive Status: Within Functional Limits for tasks assessed                      Exercises Other Exercises Other Exercises: Seated LE therex, 1x12, AROM for muscular strength/endurance: ankle pumps, LAQs, iso hip adduct, marching. Good isolated R LE strength/ROM despite acute surgery.    General Comments        Pertinent Vitals/Pain Pain Assessment: 0-10 Pain Score: 7  Pain Location: R hip Pain Descriptors / Indicators: Aching;Guarding Pain Intervention(s): Limited activity within patient's tolerance;Monitored during session;Repositioned    Home Living                      Prior Function            PT Goals (current goals can now be found in the care plan section) Acute Rehab PT Goals Patient Stated Goal: to get better PT Goal Formulation: With patient Time For Goal Achievement: 12/20/16 Potential to Achieve Goals: Good Progress towards PT goals: Progressing toward goals    Frequency    BID (corrected  to BID (incorrectly entered on initial evaluation))      PT Plan Current plan remains appropriate    Co-evaluation             End of Session Equipment Utilized During Treatment: Gait belt Activity Tolerance: Patient tolerated treatment well Patient left: in bed;with call bell/phone within reach;with bed alarm set Nurse Communication: Mobility status PT Visit Diagnosis: Pain;Muscle weakness (generalized)  (M62.81);Difficulty in walking, not elsewhere classified (R26.2) Pain - Right/Left: Right Pain - part of body: Hip     Time: 0902-0930 PT Time Calculation (min) (ACUTE ONLY): 28 min  Charges:  $Gait Training: 8-22 mins $Therapeutic Exercise: 8-22 mins                    G Codes:       Maridee Slape H. Owens Shark, PT, DPT, NCS 12/07/16, 10:13 AM [phone]

## 2016-12-07 NOTE — Progress Notes (Signed)
   Subjective: 2 Days Post-Op Procedure(s) (LRB): ARTHROPLASTY BIPOLAR HIP (HEMIARTHROPLASTY) (Right) Patient reports pain as moderate.   Patient is well, and has had no acute complaints or problems Continue with physical therapy today.  Plan is to go Rehab after hospital stay. no nausea and no vomiting Patient denies any chest pains or shortness of breath. Objective: Vital signs in last 24 hours: Temp:  [97.7 F (36.5 C)-98.2 F (36.8 C)] 97.9 F (36.6 C) (03/15 0339) Pulse Rate:  [68-79] 79 (03/14 2355) Resp:  [14-20] 20 (03/15 0339) BP: (90-104)/(47-74) 100/74 (03/15 0339) SpO2:  [96 %-99 %] 97 % (03/15 0339) Weight:  [44.4 kg (97 lb 14.4 oz)] 44.4 kg (97 lb 14.4 oz) (03/15 0437) well approximated incision Heels are non tender and elevated off the bed using rolled towels Intake/Output from previous day: 03/14 0701 - 03/15 0700 In: 1764.2 [P.O.:120; I.V.:1644.2] Out: 150 [Drains:150] Intake/Output this shift: No intake/output data recorded.   Recent Labs  12/04/16 1410 12/05/16 0322  HGB 10.2* 8.7*    Recent Labs  12/04/16 1410 12/05/16 0322  WBC 6.9 3.4*  RBC 3.79* 3.25*  HCT 32.2* 27.0*  PLT 171 140*    Recent Labs  12/05/16 0322 12/06/16 0511  NA 139 144  K 3.2* 3.8  CL 108 115*  CO2 24 21*  BUN 43* 45*  CREATININE 1.65* 1.70*  GLUCOSE 96 89  CALCIUM 9.4 8.8*   No results for input(s): LABPT, INR in the last 72 hours.  EXAM General - Patient is Alert, Appropriate and Oriented Extremity - Neurologically intact Neurovascular intact Sensation intact distally Intact pulses distally Dorsiflexion/Plantar flexion intact No cellulitis present Compartment soft Dressing - dressing C/D/I Motor Function - intact, moving foot and toes well on exam.    Past Medical History:  Diagnosis Date  . A-fib ([place])   . Breast cancer ([place])    right  . CHF (congestive heart failure) ([place])   . Diabetes mellitus without complication ([place])   . Diverticulosis    . GERD (gastroesophageal reflux disease)   . Glaucoma   . H/O: GI bleed   . History of colonic polyps   . HTN (hypertension)   . Hyperlipidemia   . Palpitations   . PUD (peptic ulcer disease)   . PVD (peripheral vascular disease) ([facility])     Assessment/Plan: 2 Days Post-Op Procedure(s) (LRB): ARTHROPLASTY BIPOLAR HIP (HEMIARTHROPLASTY) (Right) Active Problems:   Closed right hip fracture ([facility])  Estimated body mass index is 19.12 kg/m as calculated from the following:   Height as of this encounter: 5' (1.524 m).   Weight as of this encounter: 44.4 kg (97 lb 14.4 oz). Up with therapy Discharge to SNF  Labs: None DVT Prophylaxis - Lovenox, Foot Pumps and TED hose Weight-Bearing as tolerated to right leg Please change dressing to right hip prior to patient being discharged Change dressing as needed  Follow-up [facility] in 6 weeks. Sooner if any problems Continue Lovenox 30 mg daily for 14 days Staples are to be removed 2 weeks postop and apply benzoin and half-inch Steri-Strips TED stockings are to be worn for 6 weeks both legs. These may be removed one hour per 8 hour shift Elevate heels off bed. Protective padded over the sacrum and checked scant tissue breakdown    Jon R. Bement [place] 12/07/2016, 7:21 AM

## 2016-12-07 NOTE — Discharge Summary (Signed)
[facility] [facility] [place] NAME: Sharon Frazier    MR#:  [id number]  DATE OF BIRTH:  08-09-1922  DATE OF ADMISSION:  12/04/2016 ADMITTING PHYSICIAN: Nicholes Mango, MD  DATE OF DISCHARGE: 12/07/16 PRIMARY CARE PHYSICIAN: Sheral Flow, NP    ADMISSION DIAGNOSIS:  Closed fracture of right hip, initial encounter ([place]) [S72.001A]  DISCHARGE DIAGNOSIS:  Active Problems:   Closed right hip fracture (HCC)   SECONDARY DIAGNOSIS:   Past Medical History:  Diagnosis Date  . A-fib ([place])   . Breast cancer ([place])    right  . CHF (congestive heart failure) ([place])   . Diabetes mellitus without complication ([place])   . Diverticulosis   . GERD (gastroesophageal reflux disease)   . Glaucoma   . H/O: GI bleed   . History of colonic polyps   . HTN (hypertension)   . Hyperlipidemia   . Palpitations   . PUD (peptic ulcer disease)   . PVD (peripheral vascular disease) [facility]     HOSPITAL COURSE:  HPI Sharon Frazier  is a 83 y.o. female with a known history of Chronic atrial fibrillation, on aspirin, diabetes mellitus, hypertension and hyperlipidemia is presenting to the ED with a chief complaint of worsening of right hip pain for the past 3 days. Denies any fall or trauma. Patient was unable to ambulate as the pain was so severe x-ray in the emergency department has revealed right subcapital femoral fracture. Call placed to orthopedics Dr. Marry Guan. Patient is resting of her ability during my examination. Daughter-in-law at [facility] course  #1 right femoral neck fracture-post op day #2 Tolerated procedure well. Pain is manageable Follow-up with orthopedics Rogers Blocker, PA in 6 weeks sooner as needed -Patient is a moderate risk for noncardiac surgery, appreciate cardiology input. -Echocardiogram-55-70% left ventricular ejection fraction -Continue pain control. PT is recommending skilled nursing facility- Continue TED stockings for 6  weeks Lovenox 30 mg subcutaneous once daily for 14 days for DVT prophylaxis Staples to be removed 2 weeks postop and applied benzoin and half-inch Steri-Strips  #2 chronic atrial fibrillation-heart rate is well controlled. Not on anticoagulation as outpatient due to history of GI bleed. -Only on aspirin, which is on hold due to anemia at this time. Outpatient follow-up with cardiology in a week Dr. Judee Clara restarting aspirin -Continue metoprolol and Cardizem  #3 chronic anemia-anemia of chronic disease, history of GI bleed. Iron deficiency anemia.  HB 8.8 -Hold aspirinUntil seen and evaluated by cardiology in a week, will consider repeating CBC during follow-up visit  #4 hypokalemia-being replaced,k -3.9  #5 CKD-stage III. Baseline creatinine around 1.8--1.66 -Better than baseline at this time. Continue to monitor closely  #6 diastolic CHF-well compensated. Follow-up echocardiogram with ejection fraction 55-70% -Continue Lasix daily.  #7 DVT prophylaxis-Lovenox 30 mg subcutaneous daily basis  DISCHARGE CONDITIONS:   Follow-up with primary care physician at the facility in 2-3 days Follow-up with cardiology Dr. Fletcher Anon in a week, cardiologist to consider repeating CBC in a week during the follow-up visit Follow-up with the [facility] orthopedics in 6 weeks Staples to be removed in 2 weeks postop and applYbenzoin and half-inch Steri-Strips   CONSULTS OBTAINED:  Treatment Team:  Dereck Leep, MD   PROCEDURES   DRUG ALLERGIES:   Allergies  Allergen Reactions  . Alphagan [Brimonidine]     Arm numbness      DISCHARGE MEDICATIONS:   Current Discharge Medication List    START taking these medications   Details  acetaminophen (TYLENOL) 325 MG tablet Take 2 tablets (650 mg total) by mouth every 6 (six) hours as needed for mild pain (or Fever >/= 101).    enoxaparin (LOVENOX) 30 MG/0.3ML injection Inject 0.3 mLs (30 mg total) into the skin daily. Qty: 14 Syringe,  Refills: 0    feeding supplement, GLUCERNA SHAKE, (GLUCERNA SHAKE) LIQD Take 237 mLs by mouth 3 (three) times daily between meals. Qty: 90 Can, Refills: 0    !! insulin aspart (NOVOLOG) 100 UNIT/ML injection Inject 0-9 Units into the skin 3 (three) times daily with meals. Qty: 10 mL, Refills: 11    !! insulin aspart (NOVOLOG) 100 UNIT/ML injection Inject 0-5 Units into the skin at bedtime. Qty: 10 mL, Refills: 11    metoCLOPramide (REGLAN) 10 MG tablet Take 1 tablet (10 mg total) by mouth 4 (four) times daily -  before meals and at bedtime. Qty: 90 tablet, Refills: 0    oxyCODONE (OXY IR/ROXICODONE) 5 MG immediate release tablet Take 1 tablet (5 mg total) by mouth every 4 (four) hours as needed for breakthrough pain ((for MODERATE breakthrough pain)). Qty: 30 tablet, Refills: 0    senna-docusate (SENOKOT-S) 8.6-50 MG tablet Take 1 tablet by mouth 2 (two) times daily.     !! - Potential duplicate medications found. Please discuss with provider.    CONTINUE these medications which have NOT CHANGED   Details  diltiazem (CARDIZEM SR) 120 MG 12 hr capsule Take 120 mg by mouth daily.    dorzolamide (TRUSOPT) 2 % ophthalmic solution 1 drop 2 (two) times daily.    furosemide (LASIX) 40 MG tablet Take 40 mg by mouth daily.     latanoprost (XALATAN) 0.005 % ophthalmic solution Place 1 drop into both eyes at bedtime.    lisinopril-hydrochlorothiazide (PRINZIDE,ZESTORETIC) 10-12.5 MG tablet Take 0.5 tablets by mouth daily. Qty: 45 tablet, Refills: 3   Associated Diagnoses: Essential hypertension    metoprolol tartrate (LOPRESSOR) 25 MG tablet Take 0.5 tablets (12.5 mg total) by mouth 2 (two) times daily. Qty: 45 tablet, Refills: 3   Associated Diagnoses: Essential hypertension    omeprazole (PRILOSEC) 20 MG capsule TAKE ONE (1) CAPSULE BY MOUTH 2 TIMES DAILY BEFORE MEALS Qty: 180 capsule, Refills: 1    POLY-IRON 150 150 MG capsule TAKE ONE (1) CAPSULE BY MOUTH 2 TIMES DAILY Qty: 60  capsule, Refills: 5   Associated Diagnoses: Anemia, iron deficiency    potassium chloride (K-DUR,KLOR-CON) 10 MEQ tablet Take 1 tablet by mouth Monday through Friday. Qty: 60 tablet, Refills: 1   Associated Diagnoses: Essential hypertension      STOP taking these medications     aspirin 81 MG tablet          DISCHARGE INSTRUCTIONS:   Follow-up with primary care physician at the facility in 2-3 days Follow-up with cardiology Dr. Fletcher Anon in a week, cardiologist to consider repeating CBC in a week during the follow-up visit Follow-up with the [facility] orthopedics in 6 weeks Staples to be removed in 2 weeks postop and applYbenzoin and half-inch Steri-Strips   DIET:  Cardiac diet, DIABETIC  DISCHARGE CONDITION:  Stable  ACTIVITY:  Activity as tolerated PER pt   OXYGEN:  Home Oxygen: No.   Oxygen Delivery: RA   DISCHARGE LOCATION:  nursing home   If you experience worsening of your admission symptoms, develop shortness of breath, life threatening emergency, suicidal or homicidal thoughts you must seek medical attention immediately by calling 911 or calling your MD immediately  if symptoms less  severe.  You Must read complete instructions/literature along with all the possible adverse reactions/side effects for all the Medicines you take and that have been prescribed to you. Take any new Medicines after you have completely understood and accpet all the possible adverse reactions/side effects.   Please note  You were cared for by a hospitalist during your hospital stay. If you have any questions about your discharge medications or the care you received while you were in the hospital after you are discharged, you can call the unit and asked to speak with the hospitalist on call if the hospitalist that took care of you is not available. Once you are discharged, your primary care physician will handle any further medical issues. Please note that NO REFILLS for any discharge medications  will be authorized once you are discharged, as it is imperative that you return to your primary care physician (or establish a relationship with a primary care physician if you do not have one) for your aftercare needs so that they can reassess your need for medications and monitor your lab values.     Today  Chief Complaint  Patient presents with  . Hip Injury   Patient is out of bed to chair denies any complaints. Resting comfortably. Had a bowel movement  ROS:  CONSTITUTIONAL: Denies fevers, chills. Denies any fatigue, weakness.  EYES: Denies blurry vision, double vision, eye pain. EARS, NOSE, THROAT: Denies tinnitus, ear pain, hearing loss. RESPIRATORY: Denies cough, wheeze, shortness of breath.  CARDIOVASCULAR: Denies chest pain, palpitations, edema.  GASTROINTESTINAL: Denies nausea, vomiting, diarrhea, abdominal pain. Denies bright red blood per rectum. GENITOURINARY: Denies dysuria, hematuria. ENDOCRINE: Denies nocturia or thyroid problems. HEMATOLOGIC AND LYMPHATIC: Denies easy bruising or bleeding. SKIN: Denies rash or lesion. MUSCULOSKELETAL: Denies pain in neck, back, shoulder, knees, hips or arthritic symptoms.  NEUROLOGIC: Denies paralysis, paresthesias.  PSYCHIATRIC: Denies anxiety or depressive symptoms.   VITAL SIGNS:  Blood pressure 116/76, pulse (!) 113, temperature (!) 96.8 F (36 C), temperature source Axillary, resp. rate 20, height 5' (1.524 m), weight 44.4 kg (97 lb 14.4 oz), SpO2 100 %.  I/O:    Intake/Output Summary (Last 24 hours) at 12/07/16 1415 Last data filed at 12/07/16 0900  Gross per 24 hour  Intake          1844.17 ml  Output              150 ml  Net          1694.17 ml    PHYSICAL EXAMINATION:  GENERAL:  83 y.o.-year-old patient lying in the bed with no acute distress.  EYES: Pupils equal, round, reactive to light and accommodation. No scleral icterus. Extraocular muscles intact.  HEENT: Head atraumatic, normocephalic. Oropharynx and  nasopharynx clear. Bruises are noticed on the left side of the face NECK:  Supple, no jugular venous distention. No thyroid enlargement, no tenderness.  LUNGS: Normal breath sounds bilaterally, no wheezing, rales,rhonchi or crepitation. No use of accessory muscles of respiration.  CARDIOVASCULAR: S1, S2 normal. No murmurs, rubs, or gallops.  ABDOMEN: Soft, non-tender, non-distended. Bowel sounds present. No organomegaly or mass.  EXTREMITIES: Right femoral neck fracture status post repair with clean dressing No pedal edema, cyanosis, or clubbing.  NEUROLOGIC: Cranial nerves II through XII are intact. Muscle strength 5/5 in all extremities. Sensation intact. Gait not checked.  PSYCHIATRIC: The patient is alert and oriented x 3.  SKIN: No obvious rash, lesion, or ulcer.   DATA REVIEW:   CBC  Recent  Labs Lab 12/07/16 0737  WBC 6.2  HGB 8.8*  HCT 28.2*  PLT 158    Chemistries   Recent Labs Lab 12/05/16 0322  12/07/16 0737  NA 139  < > 138  K 3.2*  < > 3.9  CL 108  < > 110  CO2 24  < > 21*  GLUCOSE 96  < > 114*  BUN 43*  < > 44*  CREATININE 1.65*  < > 1.66*  CALCIUM 9.4  < > 8.8*  AST 23  --   --   ALT 12*  --   --   ALKPHOS 91  --   --   BILITOT 0.7  --   --   < > = values in this interval not displayed.  Cardiac Enzymes No results for input(s): TROPONINI in the last 168 hours.  Microbiology Results  Results for orders placed or performed during the hospital encounter of 12/04/16  MRSA PCR Screening     Status: None   Collection Time: 12/04/16  6:09 PM  Result Value Ref Range Status   MRSA by PCR NEGATIVE NEGATIVE Final    Comment:        The GeneXpert MRSA Assay (FDA approved for NASAL specimens only), is one component of a comprehensive MRSA colonization surveillance program. It is not intended to diagnose MRSA infection nor to guide or monitor treatment for MRSA infections.     RADIOLOGY:  Dg Chest 1 View  Result Date: 12/04/2016 CLINICAL DATA:   Right hip fracture.  Ex-smoker. EXAM: CHEST 1 VIEW COMPARISON:  04/23/2014. FINDINGS: Grossly stable enlarged cardiac silhouette. Small to moderate amount of left lateral pleural fluid or thickening, significantly decreased. Mildly prominent interstitial markings with improvement. Thoracic aortic calcifications. Diffuse osteopenia. IMPRESSION: 1. Decreased left pleural thickening or fluid. 2. Stable cardiomegaly. 3. Aortic atherosclerosis. Electronically Signed   By: Claudie Revering M.D.   On: 12/04/2016 14:59   Dg Lumbar Spine 2-3 Views  Result Date: 12/04/2016 CLINICAL DATA:  Right hip pain.  Difficulty walking. EXAM: LUMBAR SPINE - 2-3 VIEW COMPARISON:  None FINDINGS: Degenerative disc disease in the upper and mid lumbar spine. Degenerative set disease in the lower lumbar spine. Slight anterolisthesis of L5 on S1. No fracture. Diffuse osteopenia. Diffuse aortic and iliac calcifications. Bilateral common iliac stents noted. IMPRESSION: Degenerative disc and facet disease. Osteopenia. No acute findings. Electronically Signed   By: Rolm Baptise M.D.   On: 12/04/2016 12:23   Dg Hip Port Unilat With Pelvis 1v Right  Result Date: 12/05/2016 CLINICAL DATA:  Status post right hip hemiarthroplasty. EXAM: DG HIP (WITH OR WITHOUT PELVIS) 1V PORT RIGHT COMPARISON:  Pelvic radiograph 12/04/2016 FINDINGS: The patient has undergone hemiarthroplasty of the right hip. The prosthetic components are well seated without abnormal surrounding lucency. There is soft tissue gas at the operative site. The appearance of the left hip and associated hardware is unchanged. IMPRESSION: Right hip hemiarthroplasty without hardware abnormality. Electronically Signed   By: Ulyses Jarred M.D.   On: 12/05/2016 22:46   Dg Hip Unilat W Or Wo Pelvis 2-3 Views Right  Result Date: 12/04/2016 CLINICAL DATA:  Known right hip fracture. EXAM: DG HIP (WITH OR WITHOUT PELVIS) 2-3V RIGHT COMPARISON:  None. FINDINGS: There is a subcapital right hip  fracture without dislocation. IMPRESSION: Subcapital right hip fracture without dislocation. Electronically Signed   By: Dorise Bullion III M.D   On: 12/04/2016 14:58   Dg Hip Unilat W Or W/o Pelvis 2-3 Views Right  Result Date: 12/04/2016 CLINICAL DATA:  Right hip pain.  Difficulty walking. EXAM: DG HIP (WITH OR WITHOUT PELVIS) 2-3V RIGHT COMPARISON:  None. FINDINGS: There is a right femoral neck fracture with mild varus angulation. Mild degenerative changes in the hips bilaterally. Hardware noted in the left proximal femur. IMPRESSION: Right femoral neck fracture with varus angulation. Electronically Signed   By: Rolm Baptise M.D.   On: 12/04/2016 12:22    EKG:   Orders placed or performed during the hospital encounter of 12/04/16  . EKG 12-Lead  . EKG 12-Lead      Management plans discussed with the patient, family and they are in agreement.  CODE STATUS:     Code Status Orders        Start     Ordered   12/04/16 1709  Do not attempt resuscitation (DNR)  Continuous    Question Answer Comment  In the event of cardiac or respiratory ARREST Do not call a "code blue"   In the event of cardiac or respiratory ARREST Do not perform Intubation, CPR, defibrillation or ACLS   In the event of cardiac or respiratory ARREST Use medication by any route, position, wound care, and other measures to relive pain and suffering. May use oxygen, suction and manual treatment of airway obstruction as needed for comfort.   Comments RN may pronounce      12/04/16 1709    Code Status History    Date Active Date Inactive Code Status Order ID Comments User Context   This patient has a current code status but no historical code status.    Advance Directive Documentation     Most Recent Value  Type of Advance Directive  Healthcare Power of Attorney, Living will  Pre-existing out of facility DNR order (yellow form or pink MOST form)  -  "MOST" Form in Place?  -      TOTAL TIME TAKING CARE OF THIS  PATIENT: 43  minutes.   Note: This dictation was prepared with Dragon dictation along with smaller phrase technology. Any transcriptional errors that result from this process are unintentional.   @[facility] @  on 12/07/2016 at 2:15 PM  Between 7am to 6pm - Pager - [phone]  After 6pm go to [email or web address] - password [email or web address] [facility]  [place] [facility]  Office  [phone]  CC: Primary care physician; Sheral Flow, NP

## 2016-12-07 NOTE — Clinical Social Work Placement (Signed)
   CLINICAL SOCIAL WORK PLACEMENT  NOTE  Date:  12/07/2016  Patient Details  Name: Sharon Frazier MRN: [id number] Date of Birth: 1921/10/12  Clinical Social Work is seeking post-discharge placement for this patient at the [place] level of care (*CSW will initial, date and re-position this form in  chart as items are completed):  Yes   Patient/family provided with [facility] Work Department's list of facilities offering this level of care within the geographic area requested by the patient (or if unable, by the patient's family).  Yes   Patient/family informed of their freedom to choose among providers that offer the needed level of care, that participate in Medicare, Medicaid or managed care program needed by the patient, have an available bed and are willing to accept the patient.  Yes   Patient/family informed of [facility]'s ownership interest in [facility] and [facility], as well as of the fact that they are under no obligation to receive care at these facilities.  PASRR submitted to EDS on       PASRR number received on       Existing PASRR number confirmed on 12/05/16     FL2 transmitted to all facilities in geographic area requested by pt/family on 12/05/16     FL2 transmitted to all facilities within larger geographic area on       Patient informed that his/her managed care company has contracts with or will negotiate with certain facilities, including the following:        Yes   Patient/family informed of bed offers received.  Patient chooses bed at  [facility])     Physician recommends and patient chooses bed at      Patient to be transferred to  [facility]) on 12/07/16.  Patient to be transferred to facility by  [facility])     Patient family notified on 12/07/16 of transfer.  Name of family member notified:   (Patients son and other family members in patients room. )     PHYSICIAN        Additional Comment:    _______________________________________________ Danie Chandler, [place] Work 12/07/2016, 2:34 PM

## 2016-12-07 NOTE — Discharge Instructions (Signed)
Follow-up with primary care physician at the facility in 2-3 days Follow-up with cardiology Dr. Fletcher Anon in a week, cardiologist to consider repeating CBC in a week during the follow-up visit Follow-up with the [facility] orthopedics in 6 weeks Staples to be removed in 2 weeks postop and applYbenzoin and half-inch Steri-Strips

## 2016-12-07 NOTE — Anesthesia Postprocedure Evaluation (Signed)
Anesthesia Post Note  Patient: Sharon Frazier  Procedure(s) Performed: Procedure(s) (LRB): ARTHROPLASTY BIPOLAR HIP (HEMIARTHROPLASTY) (Right)  Patient location during evaluation: PACU Anesthesia Type: General Level of consciousness: awake and alert Pain management: pain level controlled Vital Signs Assessment: post-procedure vital signs reviewed and stable Respiratory status: spontaneous breathing, nonlabored ventilation, respiratory function stable and patient connected to nasal cannula oxygen Cardiovascular status: blood pressure returned to baseline and stable Postop Assessment: no signs of nausea or vomiting Anesthetic complications: no     Last Vitals:  Vitals:   12/07/16 0339 12/07/16 1035  BP: 100/74 116/76  Pulse:  (!) 113  Resp: 20 20  Temp: 36.6 C (!) 36 C    Last Pain:  Vitals:   12/07/16 1049  TempSrc:   PainSc: 0-No pain                 Martha Clan

## 2016-12-07 NOTE — Progress Notes (Signed)
Patient is medically stable for discharge today to [facility]. Per Florentina Jenny, admissions coordinator at [facility], patient can come to room [place]. Social work Patent examiner discharge orders in [facility]. Social work Theatre manager met with patient and patients family members at bedside explaining that patient will discharge today to [facility]. Patient and patient's family members verbally agreed they understood.  Husband completed admissions paperwork at [facility] today. RN will call report and arrange EMS for transport. Please re-consult if future social work needs arise. Social work Architectural technologist off.   Danie Chandler, Social Work Intern  [phone]

## 2016-12-07 NOTE — Telephone Encounter (Signed)
Patient contacted regarding discharge from [facility] on [facility] today.  Patient understands to follow up with provider Dr. Rockey Situ on 12/19/16 at 3:20PM at [facility]. Patient understands discharge instructions? Not received yet Patient understands medications and regiment? Not received yet Patient understands to bring all medications to this visit? Yes  Spoke with patients son and he states that she is being possibly discharged today to [facility]. Reviewed appointment information with him and he states that he is not sure if patient will be able to make this appointment. Let him know that the hospital schedules this post hospital visit and that he should check with [facility] about this before cancelling. Provided him with our number and instructions to call back if they are unable to keep this appointment. He was appreciate for the call and verbalized understanding to call back if he has any questions regarding her discharge instructions or medications.

## 2016-12-07 NOTE — Telephone Encounter (Signed)
TCM ph [facility] seen for surgical clearance hip fx while at hospital needs 1 week fu

## 2016-12-07 NOTE — Progress Notes (Signed)
Report called to [facility]. Pt ready for discharge. Family has been at bedside.

## 2016-12-08 LAB — SURGICAL PATHOLOGY

## 2016-12-09 LAB — BPAM RBC
Blood Product Expiration Date: 201804192359
Blood Product Expiration Date: 201804192359
UNIT TYPE AND RH: 5100
UNIT TYPE AND RH: 5100

## 2016-12-09 LAB — TYPE AND SCREEN
ABO/RH(D): O POS
Antibody Screen: POSITIVE
Unit division: 0
Unit division: 0
Unit tag comment: NEGATIVE
Unit tag comment: NEGATIVE

## 2016-12-11 ENCOUNTER — Encounter: Payer: Self-pay | Admitting: Internal Medicine

## 2016-12-11 ENCOUNTER — Non-Acute Institutional Stay (SKILLED_NURSING_FACILITY): Payer: Medicare Other | Admitting: Internal Medicine

## 2016-12-11 DIAGNOSIS — K5909 Other constipation: Secondary | ICD-10-CM | POA: Diagnosis not present

## 2016-12-11 DIAGNOSIS — I482 Chronic atrial fibrillation, unspecified: Secondary | ICD-10-CM

## 2016-12-11 DIAGNOSIS — Z8719 Personal history of other diseases of the digestive system: Secondary | ICD-10-CM

## 2016-12-11 DIAGNOSIS — S72001S Fracture of unspecified part of neck of right femur, sequela: Secondary | ICD-10-CM

## 2016-12-11 DIAGNOSIS — R2681 Unsteadiness on feet: Secondary | ICD-10-CM | POA: Diagnosis not present

## 2016-12-11 DIAGNOSIS — D500 Iron deficiency anemia secondary to blood loss (chronic): Secondary | ICD-10-CM | POA: Diagnosis not present

## 2016-12-11 DIAGNOSIS — L89151 Pressure ulcer of sacral region, stage 1: Secondary | ICD-10-CM

## 2016-12-11 DIAGNOSIS — N183 Chronic kidney disease, stage 3 unspecified: Secondary | ICD-10-CM

## 2016-12-11 DIAGNOSIS — I5032 Chronic diastolic (congestive) heart failure: Secondary | ICD-10-CM

## 2016-12-11 DIAGNOSIS — E1122 Type 2 diabetes mellitus with diabetic chronic kidney disease: Secondary | ICD-10-CM

## 2016-12-11 DIAGNOSIS — E440 Moderate protein-calorie malnutrition: Secondary | ICD-10-CM

## 2016-12-11 NOTE — Progress Notes (Signed)
LOCATION: [facility]  PCP: Sheral Flow, NP   Code Status: DNR  Goals of care: Advanced Directive information Advanced Directives 12/11/2016  Does Patient Have a Medical Advance Directive? Yes  Type of Advance Directive Out of facility DNR (pink MOST or yellow form)  Does patient want to make changes to medical advance directive? No - Patient declined  Copy of [place] in Chart? -       Extended Emergency Contact Information Primary Emergency Contact: Velez,Charles Address: [address]          [place], [place] [address] Johnnette Litter of [place] Phone: [phone] Relation: Son   Allergies  Allergen Reactions  . Alphagan [Brimonidine]     Arm numbness      Chief Complaint  Patient presents with  . New Admit To SNF    New Admission Visit      HPI:  Patient is a 83 y.o. female seen today for short term rehabilitation post hospital admission from 12/04/16-12/07/16 with closed right hip fracture. She underwent right hip arthroplasty. She has PMH of chronic afib, DM, HTN, CKD 3, HLD, chronic diastolic CHF among others. She is seen in her room today.   Review of Systems:  Constitutional: Negative for fever, chills, diaphoresis.  HENT: Negative for headache, congestion, nasal discharge, difficulty swallowing.   Eyes: Negative for double vision and discharge.  Respiratory: Negative for cough, shortness of breath and wheezing.   Cardiovascular: Negative for chest pain, palpitations, leg swelling.  Gastrointestinal: Negative for heartburn, nausea, vomiting, abdominal pain,loss of appetite. Patient doesn't remember her last bowel movement. Passing flatus. At home she had bowel movement every 3-4 days Genitourinary: Negative for dysuria Musculoskeletal: Negative for back pain, fall in the facility. denies pain this visit.  Skin: Negative for itching, rash.  Neurological: Negative for dizziness. Psychiatric/Behavioral:  Negative for depression   Past Medical History:  Diagnosis Date  . A-fib ([place])   . Breast cancer ([place])    right  . CHF (congestive heart failure) ([place])   . Diabetes mellitus without complication ([place])   . Diverticulosis   . GERD (gastroesophageal reflux disease)   . Glaucoma   . H/O: GI bleed   . History of colonic polyps   . HTN (hypertension)   . Hyperlipidemia   . Palpitations   . PUD (peptic ulcer disease)   . PVD (peripheral vascular disease) ([place])    Past Surgical History:  Procedure Laterality Date  . HIP ARTHROPLASTY Right 12/05/2016   Procedure: ARTHROPLASTY BIPOLAR HIP (HEMIARTHROPLASTY);  Surgeon: Dereck Leep, MD;  Location: [facility] ORS;  Service: Orthopedics;  Laterality: Right;  . MASTECTOMY     right breast   . Open reduction and internal fixation of a left intertrochanteric femur fracture  2007   Dr. Leanor Kail  . RENAL ARTERY STENT    . VAGINAL HYSTERECTOMY     Social History:   reports that she quit smoking about 29 years ago. Her smoking use included Cigarettes. She has a 50.00 pack-year smoking history. She has never used smokeless tobacco. She reports that she does not drink alcohol or use drugs.  Family History  Problem Relation Age of Onset  . Stroke Mother   . Aneurysm Father   . Cancer Sister     Colon cancer    Medications: Allergies as of 12/11/2016      Reactions   Alphagan [brimonidine]    Arm numbness       Medication  List       Accurate as of 12/11/16  1:22 PM. Always use your most recent med list.          acetaminophen 325 MG tablet Commonly known as:  TYLENOL Take 2 tablets (650 mg total) by mouth every 6 (six) hours as needed for mild pain (or Fever >/= 101).   diltiazem 120 MG 12 hr capsule Commonly known as:  CARDIZEM SR Take 120 mg by mouth daily.   dorzolamide 2 % ophthalmic solution Commonly known as:  TRUSOPT Place 1 drop into both eyes daily.   enoxaparin 30 MG/0.3ML injection Commonly known as:   LOVENOX Inject 0.3 mLs (30 mg total) into the skin daily.   furosemide 40 MG tablet Commonly known as:  LASIX Take 40 mg by mouth daily.   insulin lispro 100 UNIT/ML injection Commonly known as:  HUMALOG Inject 1-10 Units into the skin 4 (four) times daily -  before meals and at bedtime.   latanoprost 0.005 % ophthalmic solution Commonly known as:  XALATAN Place 1 drop into both eyes at bedtime.   lisinopril-hydrochlorothiazide 10-12.5 MG tablet Commonly known as:  PRINZIDE,ZESTORETIC Take 0.5 tablets by mouth daily.   metoCLOPramide 10 MG tablet Commonly known as:  REGLAN Take 1 tablet (10 mg total) by mouth 4 (four) times daily -  before meals and at bedtime.   metoprolol tartrate 25 MG tablet Commonly known as:  LOPRESSOR Take 0.5 tablets (12.5 mg total) by mouth 2 (two) times daily.   omeprazole 20 MG capsule Commonly known as:  PRILOSEC TAKE ONE (1) CAPSULE BY MOUTH 2 TIMES DAILY BEFORE MEALS   oxyCODONE 5 MG immediate release tablet Commonly known as:  Oxy IR/ROXICODONE Take 1 tablet (5 mg total) by mouth every 4 (four) hours as needed for breakthrough pain ((for MODERATE breakthrough pain)).   POLY-IRON 150 150 MG capsule Generic drug:  iron polysaccharides TAKE ONE (1) CAPSULE BY MOUTH 2 TIMES DAILY   potassium chloride 10 MEQ tablet Commonly known as:  K-DUR,KLOR-CON Take 1 tablet by mouth Monday through Friday.   senna-docusate 8.6-50 MG tablet Commonly known as:  Senokot-S Take 1 tablet by mouth 2 (two) times daily.   UNABLE TO FIND Med Name: Med pass 237 mL by mouth 3 times daily between meals       Immunizations: Immunization History  Administered Date(s) Administered  . Influenza-Unspecified 05/10/2015, 05/26/2016  . PPD Test 12/07/2016  . Pneumococcal Conjugate-13 11/06/2016  . Pneumococcal Polysaccharide-23 06/12/2012  . Zoster 09/09/2012     Physical Exam: Vitals:   12/11/16 1313  BP: (!) 107/57  Pulse: 75  Resp: 16  Temp: 98.2 F  (36.8 C)  TempSrc: Oral  SpO2: 98%  Weight: 97 lb 14.4 oz (44.4 kg)  Height: 5' (1.524 m)   Body mass index is 19.12 kg/m.  General- elderly female, frail and thin built, in no acute distress Head- normocephalic, atraumatic Nose- no nasal discharge Throat- moist mucus membrane, edentulous Eyes- PERRLA, EOMI, no pallor, no icterus, no discharge, normal conjunctiva, normal sclera Neck- no cervical lymphadenopathy Cardiovascular- irregular heart rate, no murmur Respiratory- bilateral clear to auscultation, no wheeze, no rhonchi, no crackles, no use of accessory muscles Abdomen- bowel sounds present, soft, non tender, no guarding or rigidity Musculoskeletal- able to move all 4 extremities, limited right leg range of motion, no leg edema Neurological- alert and oriented to person, place and time Skin- warm and dry, bruising to arms and bruise underneath both eyes, right hip surgical  incision with honeycomb dressing, stage 1 pressure ulcer coccyx Psychiatry- normal mood and affect    Labs reviewed: Basic Metabolic Panel:  Recent Labs  12/05/16 0322 12/06/16 0511 12/07/16 0737  NA 139 144 138  K 3.2* 3.8 3.9  CL 108 115* 110  CO2 24 21* 21*  GLUCOSE 96 89 114*  BUN 43* 45* 44*  CREATININE 1.65* 1.70* 1.66*  CALCIUM 9.4 8.8* 8.8*   Liver Function Tests:  Recent Labs  11/06/16 1040 12/05/16 0322  AST 20 23  ALT 9 12*  ALKPHOS 98 91  BILITOT 0.4 0.7  PROT 6.6 6.3*  ALBUMIN 3.3* 2.8*   No results for input(s): LIPASE, AMYLASE in the last 8760 hours. No results for input(s): AMMONIA in the last 8760 hours. CBC:  Recent Labs  11/09/16 0800 12/04/16 1410 12/05/16 0322 12/07/16 0737  WBC 4.2 6.9 3.4* 6.2  NEUTROABS 2.5  --   --   --   HGB 7.3* 10.2* 8.7* 8.8*  HCT 23.8* 32.2* 27.0* 28.2*  MCV 75.7* 85.0 83.0 87.1  PLT 260 171 140* 158   Cardiac Enzymes: No results for input(s): CKTOTAL, CKMB, CKMBINDEX, TROPONINI in the last 8760 hours. BNP: Invalid  input(s): POCBNP CBG:  Recent Labs  12/06/16 2122 12/07/16 0725 12/07/16 1223  GLUCAP 77 102* 130*    Radiological Exams: Dg Chest 1 View  Result Date: 12/04/2016 CLINICAL DATA:  Right hip fracture.  Ex-smoker. EXAM: CHEST 1 VIEW COMPARISON:  04/23/2014. FINDINGS: Grossly stable enlarged cardiac silhouette. Small to moderate amount of left lateral pleural fluid or thickening, significantly decreased. Mildly prominent interstitial markings with improvement. Thoracic aortic calcifications. Diffuse osteopenia. IMPRESSION: 1. Decreased left pleural thickening or fluid. 2. Stable cardiomegaly. 3. Aortic atherosclerosis. Electronically Signed   By: Claudie Revering M.D.   On: 12/04/2016 14:59   Dg Lumbar Spine 2-3 Views  Result Date: 12/04/2016 CLINICAL DATA:  Right hip pain.  Difficulty walking. EXAM: LUMBAR SPINE - 2-3 VIEW COMPARISON:  None FINDINGS: Degenerative disc disease in the upper and mid lumbar spine. Degenerative set disease in the lower lumbar spine. Slight anterolisthesis of L5 on S1. No fracture. Diffuse osteopenia. Diffuse aortic and iliac calcifications. Bilateral common iliac stents noted. IMPRESSION: Degenerative disc and facet disease. Osteopenia. No acute findings. Electronically Signed   By: Rolm Baptise M.D.   On: 12/04/2016 12:23   Dg Hip Port Unilat With Pelvis 1v Right  Result Date: 12/05/2016 CLINICAL DATA:  Status post right hip hemiarthroplasty. EXAM: DG HIP (WITH OR WITHOUT PELVIS) 1V PORT RIGHT COMPARISON:  Pelvic radiograph 12/04/2016 FINDINGS: The patient has undergone hemiarthroplasty of the right hip. The prosthetic components are well seated without abnormal surrounding lucency. There is soft tissue gas at the operative site. The appearance of the left hip and associated hardware is unchanged. IMPRESSION: Right hip hemiarthroplasty without hardware abnormality. Electronically Signed   By: Ulyses Jarred M.D.   On: 12/05/2016 22:46   Dg Hip Unilat W Or Wo Pelvis 2-3  Views Right  Result Date: 12/04/2016 CLINICAL DATA:  Known right hip fracture. EXAM: DG HIP (WITH OR WITHOUT PELVIS) 2-3V RIGHT COMPARISON:  None. FINDINGS: There is a subcapital right hip fracture without dislocation. IMPRESSION: Subcapital right hip fracture without dislocation. Electronically Signed   By: Dorise Bullion III M.D   On: 12/04/2016 14:58   Dg Hip Unilat W Or W/o Pelvis 2-3 Views Right  Result Date: 12/04/2016 CLINICAL DATA:  Right hip pain.  Difficulty walking. EXAM: DG HIP (WITH OR WITHOUT  PELVIS) 2-3V RIGHT COMPARISON:  None. FINDINGS: There is a right femoral neck fracture with mild varus angulation. Mild degenerative changes in the hips bilaterally. Hardware noted in the left proximal femur. IMPRESSION: Right femoral neck fracture with varus angulation. Electronically Signed   By: Rolm Baptise M.D.   On: 12/04/2016 12:22    Assessment/Plan  Unsteady gait Will have her work with physical therapy and occupational therapy team to help with gait training and muscle strengthening exercises.fall precautions. Skin care. Encourage to be out of bed.  Right hip closed fracture s/p right total hip arthroplasty. Will need orthopedic follow up. Will have patient work with PT/OT as tolerated to regain strength and restore function.  Fall precautions are in place. Continue lovenox for dvt prophylaxis for 2 weeks. Continue oxyIR 5 mg q4h prn pain and tylenol 650 mg q6h prn pain. PMR consult.   Blood loss anemia Post op, check cbc. Continue iron supplement  Chronic constipation Continue senna docusate 1 tab bid and add miralax daily and monitor. Maintain hydration  Chronic afib Controlled heart rate. Continue metoprolol tartrate and diltiazem and monitor HR. Aspirin on hold with low Hb and hx of gi bleed. Will need cardiology f/u to evaluate further.  Protein calorie malnutrition With hypoalbuminemia, low BMI. RD consult. Continue medpass  Stage 1 pressure ulcer to coccyx Apply skin  prep and cover with bordered foam dressing q3 days, frequent repositioning  ckd stage 3 Monitor bmp  Chronic diastolic chf Continue metoprolol tartrate 12.5 mg bid, lasix 40 mg daily, lisinopril 10 mg daily and HCTZ 12.5 mg daily. Check bmp and weight. Continue kcl supplement  Dm type 2 with renal complication Lab Results  Component Value Date   HGBA1C 4.6 (L) 12/05/2016   On SSI. a1c is suggestive of controlled diabetes. Discontinue insulin. Check cbg daily x 1 week and if reading on average is < 200, check once a week only  History of gi bleed Stable on omeprazole, monitor. Her aspirin is currently on hold   Goals of care: short term rehabilitation   Labs/tests ordered: cbc, bmp 12/14/16  Family/ staff Communication: reviewed care plan with patient and nursing supervisor    Blanchie Serve, MD Internal Medicine [place], [place] [address] Cell Phone (Monday-Friday 8 am - 5 pm): [phone] On Call: [phone] and follow prompts after 5 pm and on weekends Office Phone: [phone] Office Fax: [phone]

## 2016-12-14 LAB — CBC AND DIFFERENTIAL
HCT: 28 % — AB (ref 36–46)
HEMOGLOBIN: 8.6 g/dL — AB (ref 12.0–16.0)
PLATELETS: 155 10*3/uL (ref 150–399)
WBC: 5 10*3/mL

## 2016-12-14 LAB — BASIC METABOLIC PANEL
BUN: 69 mg/dL — AB (ref 4–21)
Creatinine: 1.5 mg/dL — AB (ref 0.5–1.1)
GLUCOSE: 80 mg/dL
Potassium: 3.6 mmol/L (ref 3.4–5.3)
Sodium: 139 mmol/L (ref 137–147)

## 2016-12-15 ENCOUNTER — Non-Acute Institutional Stay (SKILLED_NURSING_FACILITY): Payer: Medicare Other | Admitting: Family

## 2016-12-15 ENCOUNTER — Encounter: Payer: Self-pay | Admitting: Family

## 2016-12-15 DIAGNOSIS — D649 Anemia, unspecified: Secondary | ICD-10-CM | POA: Diagnosis not present

## 2016-12-15 DIAGNOSIS — N183 Chronic kidney disease, stage 3 unspecified: Secondary | ICD-10-CM

## 2016-12-15 NOTE — Progress Notes (Signed)
Location:  [facility] Room Number: [place] of Service:  SNF (31) Provider: Ambrielle Kington FNP-C  Sheral Flow, NP  Patient Care Team: Pleas Koch, NP as PCP - General (Internal Medicine) Minna Merritts, MD as Consulting Physician (Cardiology) Edrick Kins, DPM as Consulting Physician (Podiatry) Leandrew Koyanagi, MD as Referring Physician (Ophthalmology)  Extended Emergency Contact Information Primary Emergency Contact: Makepeace,Charles Address: [place], [place] [address] Johnnette Litter of [place] Phone: [phone] Relation: Son Secondary Emergency Contact: Fawn Kirk [place] of [place] Phone: [phone] Relation: Other  Code Status: DNR Goals of care: Advanced Directive information Advanced Directives 12/15/2016  Does Patient Have a Medical Advance Directive? Yes  Type of Advance Directive Out of facility DNR (pink MOST or yellow form)  Does patient want to make changes to medical advance directive? -  Copy of [place] in Chart? -  Pre-existing out of facility DNR order (yellow form or pink MOST form) Yellow form placed in chart (order not valid for inpatient use)     Chief Complaint  Patient presents with  . Acute Visit    abnormal labs    HPI:  Pt is a 83 y.o. female seen today at [facility] and rehabilitation for an acute visit for evaluation of abnormal labs.She seen in her room today.she has a medical history of HTN, Afib, CHF, Type 2 DM,CKD stage 3, Anemia, GI bleed among other conditions.She states right hip pain under control. She continues to work well with PT/OT. Her recent lab results showed Hgb 8.6, HCT 28 (previous Hgb 8.8), BUN 69, CR 1.50 (12/14/2016). She is currently on poly-iron daily. She denies any headache, dizziness or signs of bleeding.    Past Medical History:  Diagnosis Date  . A-fib ([place])   . Breast cancer ([place])     right  . CHF (congestive heart failure) ([place])   . Diabetes mellitus without complication ([place])   . Diverticulosis   . GERD (gastroesophageal reflux disease)   . Glaucoma   . H/O: GI bleed   . History of colonic polyps   . HTN (hypertension)   . Hyperlipidemia   . Palpitations   . PUD (peptic ulcer disease)   . PVD (peripheral vascular disease) ([place])    Past Surgical History:  Procedure Laterality Date  . HIP ARTHROPLASTY Right 12/05/2016   Procedure: ARTHROPLASTY BIPOLAR HIP (HEMIARTHROPLASTY);  Surgeon: Dereck Leep, MD;  Location: [facility] ORS;  Service: Orthopedics;  Laterality: Right;  . MASTECTOMY     right breast   . Open reduction and internal fixation of a left intertrochanteric femur fracture  2007   Dr. Leanor Kail  . RENAL ARTERY STENT    . VAGINAL HYSTERECTOMY      Allergies  Allergen Reactions  . Alphagan [Brimonidine]     Arm numbness      Allergies as of 12/15/2016      Reactions   Alphagan [brimonidine]    Arm numbness       Medication List       Accurate as of 12/15/16  7:08 PM. Always use your most recent med list.          acetaminophen 325 MG tablet Commonly known as:  TYLENOL Take 650 mg by mouth. Take two tablets three times daily   diltiazem 120 MG 12 hr capsule Commonly known as:  CARDIZEM SR Take 120  mg by mouth daily.   dorzolamide 2 % ophthalmic solution Commonly known as:  TRUSOPT Place 1 drop into both eyes daily.   enoxaparin 30 MG/0.3ML injection Commonly known as:  LOVENOX Inject 0.3 mLs (30 mg total) into the skin daily.   furosemide 40 MG tablet Commonly known as:  LASIX Take 40 mg by mouth daily.   insulin lispro 100 UNIT/ML injection Commonly known as:  HUMALOG Inject 1-10 Units into the skin 4 (four) times daily -  before meals and at bedtime.   latanoprost 0.005 % ophthalmic solution Commonly known as:  XALATAN Place 1 drop into both eyes at bedtime.   lisinopril-hydrochlorothiazide 10-12.5 MG  tablet Commonly known as:  PRINZIDE,ZESTORETIC Take 0.5 tablets by mouth daily.   metoCLOPramide 10 MG tablet Commonly known as:  REGLAN Take 1 tablet (10 mg total) by mouth 4 (four) times daily -  before meals and at bedtime.   metoprolol tartrate 25 MG tablet Commonly known as:  LOPRESSOR Take 0.5 tablets (12.5 mg total) by mouth 2 (two) times daily.   omeprazole 20 MG capsule Commonly known as:  PRILOSEC TAKE ONE (1) CAPSULE BY MOUTH 2 TIMES DAILY BEFORE MEALS   POLY-IRON 150 150 MG capsule Generic drug:  iron polysaccharides TAKE ONE (1) CAPSULE BY MOUTH 2 TIMES DAILY   polyethylene glycol packet Commonly known as:  MIRALAX / GLYCOLAX Take 17 g by mouth daily. 17 gram daily hold for loose stool   potassium chloride 10 MEQ tablet Commonly known as:  K-DUR,KLOR-CON Take 1 tablet by mouth Monday through Friday.   PROSTAT PO Take by mouth. 30 ml daily for 30 days   senna-docusate 8.6-50 MG tablet Commonly known as:  Senokot-S Take 1 tablet by mouth 2 (two) times daily.   traMADol 50 MG tablet Commonly known as:  ULTRAM Take 50 mg by mouth. Take one tablet three times daily as needed for pain       Review of Systems  Constitutional: Negative for activity change, appetite change, chills, fatigue and fever.  HENT: Negative for congestion, rhinorrhea, sinus pain, sinus pressure, sneezing and sore throat.   Eyes: Negative.   Cardiovascular: Negative for chest pain, palpitations and leg swelling.  Gastrointestinal: Negative for abdominal distention, abdominal pain, blood in stool, constipation, diarrhea, nausea, rectal pain and vomiting.  Genitourinary: Negative for dysuria, flank pain, frequency, hematuria and urgency.  Musculoskeletal: Positive for gait problem.       Right hip pain under control.   Skin: Negative for color change, pallor and rash.  Neurological: Negative for dizziness, seizures, syncope, light-headedness and headaches.  Hematological: Does not  bruise/bleed easily.  Psychiatric/Behavioral: Negative for agitation, confusion, hallucinations and sleep disturbance. The patient is not nervous/anxious.     Immunization History  Administered Date(s) Administered  . Influenza-Unspecified 05/10/2015, 05/26/2016  . PPD Test 12/07/2016  . Pneumococcal Conjugate-13 11/06/2016  . Pneumococcal Polysaccharide-23 06/12/2012  . Zoster 09/09/2012   Pertinent  Health Maintenance Due  Topic Date Due  . OPHTHALMOLOGY EXAM  11/14/2016  . DEXA SCAN  11/06/2025 (Originally 08/21/1989)  . HEMOGLOBIN A1C  06/07/2017  . FOOT EXAM  08/04/2017  . INFLUENZA VACCINE  Completed  . PNA vac Low Risk Adult  Completed   Fall Risk  11/23/2016 11/06/2016 09/06/2016 05/30/2016 02/28/2016  Falls in the past year? No No No Yes No  Number falls in past yr: - - - 1 -  Injury with Fall? - - - No -  Risk for fall due to : - - -  History of fall(s) -  Follow up - - - Education provided -    Vitals:   12/15/16 1546  BP: 134/64  Pulse: 78  Temp: (!) 96.4 F (35.8 C)  SpO2: 100%  Weight: 93 lb (42.2 kg)  Height: 5' (1.524 m)   Body mass index is 18.16 kg/m. Physical Exam  Constitutional: She is oriented to person, place, and time.  Thin frail elderly in no acute distress   HENT:  Head: Normocephalic.  Mouth/Throat: Oropharynx is clear and moist. No oropharyngeal exudate.  Eyes: Conjunctivae and EOM are normal. Pupils are equal, round, and reactive to light. Right eye exhibits no discharge. Left eye exhibits no discharge. No scleral icterus.  Neck: Normal range of motion. No JVD present. No thyromegaly present.  Cardiovascular: Normal rate, regular rhythm, normal heart sounds and intact distal pulses.  Exam reveals no gallop and no friction rub.   No murmur heard. Pulmonary/Chest: Effort normal and breath sounds normal. No respiratory distress. She has no wheezes. She has no rales.  Abdominal: Soft. Bowel sounds are normal. She exhibits no distension. There is  no tenderness. There is no rebound and no guarding.  Musculoskeletal: She exhibits no edema, tenderness or deformity.  Moves x 4 extremities. Unsteady gait   Lymphadenopathy:    She has no cervical adenopathy.  Neurological: She is oriented to person, place, and time.  Skin: Skin is warm and dry. No rash noted. No erythema. No pallor.  Left face old bruise progressive healing.   Psychiatric: She has a normal mood and affect.    Labs reviewed:  Recent Labs  12/05/16 0322 12/06/16 0511 12/07/16 0737 12/14/16  NA 139 144 138 139  K 3.2* 3.8 3.9 3.6  CL 108 115* 110  --   CO2 24 21* 21*  --   GLUCOSE 96 89 114*  --   BUN 43* 45* 44* 69*  CREATININE 1.65* 1.70* 1.66* 1.5*  CALCIUM 9.4 8.8* 8.8*  --     Recent Labs  11/06/16 1040 12/05/16 0322  AST 20 23  ALT 9 12*  ALKPHOS 98 91  BILITOT 0.4 0.7  PROT 6.6 6.3*  ALBUMIN 3.3* 2.8*    Recent Labs  11/09/16 0800 12/04/16 1410 12/05/16 0322 12/07/16 0737 12/14/16  WBC 4.2 6.9 3.4* 6.2 5.0  NEUTROABS 2.5  --   --   --   --   HGB 7.3* 10.2* 8.7* 8.8* 8.6*  HCT 23.8* 32.2* 27.0* 28.2* 28*  MCV 75.7* 85.0 83.0 87.1  --   PLT 260 171 140* 158 155   Lab Results  Component Value Date   TSH 0.531 02/06/2014   Lab Results  Component Value Date   HGBA1C 4.6 (L) 12/05/2016   Lab Results  Component Value Date   CHOL 139 11/06/2016   HDL 41.50 11/06/2016   LDLCALC 75 11/06/2016   TRIG 115.0 11/06/2016   CHOLHDL 3 11/06/2016   Assessment/Plan  Anemia  Hgb 8.6, HCT 28 (previous Hgb 8.8), BUN 69, CR 1.50 (12/14/2016). Asymptomatic post-op. Continue on poly-iron daily.will continue to monitor CBC. Recheck CBC 12/27/2016.  CKD  CR 1.50 ( 12/14/2016) previous 1.65; 1.70 and 1.66. Continue to monitor. Recheck BMP 12/27/2016.   Family/ staff Communication: Reviewed plan of care with patient and facility Nurse supervisor  Labs/tests ordered: CBC and BMP 12/27/2016.    Sandrea Hughs, NP

## 2016-12-19 ENCOUNTER — Encounter: Payer: Medicare Other | Admitting: Cardiovascular Disease

## 2016-12-20 ENCOUNTER — Non-Acute Institutional Stay (SKILLED_NURSING_FACILITY): Payer: Medicare Other | Admitting: Family

## 2016-12-20 ENCOUNTER — Encounter: Payer: Self-pay | Admitting: Family

## 2016-12-20 DIAGNOSIS — J189 Pneumonia, unspecified organism: Secondary | ICD-10-CM | POA: Diagnosis not present

## 2016-12-20 DIAGNOSIS — I5032 Chronic diastolic (congestive) heart failure: Secondary | ICD-10-CM

## 2016-12-20 NOTE — Progress Notes (Signed)
Location:  [place] Room Number: [place] of Service:  SNF (31) Provider: Bryttani Blew FNP-C  Sheral Flow, NP  Patient Care Team: Pleas Koch, NP as PCP - General (Internal Medicine) Minna Merritts, MD as Consulting Physician (Cardiology) Edrick Kins, DPM as Consulting Physician (Podiatry) Leandrew Koyanagi, MD as Referring Physician (Ophthalmology)  Extended Emergency Contact Information Primary Emergency Contact: Hardacre,Charles Address: [place], [place] [address] Johnnette Litter of [place] Phone: [phone] Relation: Son Secondary Emergency Contact: Fawn Kirk [place] of [place] Phone: [phone] Relation: Other  Code Status:  DNR  Goals of care: Advanced Directive information Advanced Directives 12/15/2016  Does Patient Have a Medical Advance Directive? Yes  Type of Advance Directive Out of facility DNR (pink MOST or yellow form)  Does patient want to make changes to medical advance directive? -  Copy of [place] in Chart? -  Pre-existing out of facility DNR order (yellow form or pink MOST form) Yellow form placed in chart (order not valid for inpatient use)     Chief Complaint  Patient presents with  . Acute Visit    Shortness of breath     HPI:  Pt is a 83 y.o. female seen today at [facility] and rehabilitation for an acute visit for evaluation of shortness of breath.She has a medical history of HTN, AFib, type 2 DM,CHF,GERD, breast Cancer among other conditions. She is seen in her room today. Facility staff reported patient's B/p in the 80's/50's during therapy. She was assisted back to bed oxygen saturations in the 80's with shortness of breath noted. Portable CXR ordered showed acute infiltrates in the right lower lobe and left lower lobe with small effusion and dense consolidation at the bases.    Past Medical History:    Diagnosis Date  . A-fib ([place])   . Breast cancer ([place])    right  . CHF (congestive heart failure) ([place])   . Diabetes mellitus without complication ([place])   . Diverticulosis   . GERD (gastroesophageal reflux disease)   . Glaucoma   . H/O: GI bleed   . History of colonic polyps   . HTN (hypertension)   . Hyperlipidemia   . Palpitations   . PUD (peptic ulcer disease)   . PVD (peripheral vascular disease) ([place])    Past Surgical History:  Procedure Laterality Date  . HIP ARTHROPLASTY Right 12/05/2016   Procedure: ARTHROPLASTY BIPOLAR HIP (HEMIARTHROPLASTY);  Surgeon: Dereck Leep, MD;  Location: [facility] ORS;  Service: Orthopedics;  Laterality: Right;  . MASTECTOMY     right breast   . Open reduction and internal fixation of a left intertrochanteric femur fracture  2007   Dr. Leanor Kail  . RENAL ARTERY STENT    . VAGINAL HYSTERECTOMY      Allergies  Allergen Reactions  . Alphagan [Brimonidine]     Arm numbness      Allergies as of 12/20/2016      Reactions   Alphagan [brimonidine]    Arm numbness       Medication List       Accurate as of 12/20/16  3:15 PM. Always use your most recent med list.          acetaminophen 325 MG tablet Commonly known as:  TYLENOL Take 650 mg by mouth. Take two tablets three times daily   DECUBI-VITE PO Take by mouth  daily.   diltiazem 120 MG 12 hr capsule Commonly known as:  CARDIZEM SR Take 120 mg by mouth daily.   dorzolamide 2 % ophthalmic solution Commonly known as:  TRUSOPT Place 1 drop into both eyes daily.   doxycycline 100 MG EC tablet Commonly known as:  DORYX Take 100 mg by mouth 2 (two) times daily.   enoxaparin 30 MG/0.3ML injection Commonly known as:  LOVENOX Inject 0.3 mLs (30 mg total) into the skin daily.   furosemide 40 MG tablet Commonly known as:  LASIX Take 40 mg by mouth daily.   insulin lispro 100 UNIT/ML injection Commonly known as:  HUMALOG Inject 1-10 Units into the skin 4 (four) times  daily -  before meals and at bedtime.   latanoprost 0.005 % ophthalmic solution Commonly known as:  XALATAN Place 1 drop into both eyes at bedtime.   lisinopril-hydrochlorothiazide 10-12.5 MG tablet Commonly known as:  PRINZIDE,ZESTORETIC Take 0.5 tablets by mouth daily.   metoCLOPramide 10 MG tablet Commonly known as:  REGLAN Take 1 tablet (10 mg total) by mouth 4 (four) times daily -  before meals and at bedtime.   metoprolol tartrate 25 MG tablet Commonly known as:  LOPRESSOR Take 0.5 tablets (12.5 mg total) by mouth 2 (two) times daily.   omeprazole 20 MG capsule Commonly known as:  PRILOSEC TAKE ONE (1) CAPSULE BY MOUTH 2 TIMES DAILY BEFORE MEALS   POLY-IRON 150 150 MG capsule Generic drug:  iron polysaccharides TAKE ONE (1) CAPSULE BY MOUTH 2 TIMES DAILY   polyethylene glycol packet Commonly known as:  MIRALAX / GLYCOLAX Take 17 g by mouth daily. 17 gram daily hold for loose stool   potassium chloride 10 MEQ tablet Commonly known as:  K-DUR,KLOR-CON Take 1 tablet by mouth Monday through Friday.   PROSTAT PO Take by mouth. 30 ml daily for 30 days   saccharomyces boulardii 250 MG capsule Commonly known as:  FLORASTOR Take 250 mg by mouth 2 (two) times daily.   senna-docusate 8.6-50 MG tablet Commonly known as:  Senokot-S Take 1 tablet by mouth 2 (two) times daily.   traMADol 50 MG tablet Commonly known as:  ULTRAM Take 50 mg by mouth. Take one tablet three times daily as needed for pain   UNABLE TO FIND Med Name: Med Pass 120 mL by mouth twice daily       Review of Systems  Constitutional: Negative for activity change, appetite change, chills, fatigue and fever.  HENT: Negative for congestion, rhinorrhea, sinus pain, sinus pressure, sneezing and sore throat.   Eyes: Negative.   Respiratory: Negative for cough, chest tightness and wheezing.   Cardiovascular: Negative for chest pain, palpitations and leg swelling.  Gastrointestinal: Negative for  abdominal distention, abdominal pain, blood in stool, constipation, diarrhea, nausea, rectal pain and vomiting.  Genitourinary: Negative for dysuria, flank pain, frequency, hematuria and urgency.  Musculoskeletal: Positive for gait problem.       Right hip pain  Skin: Negative for color change, pallor and rash.  Neurological: Negative for dizziness, seizures, syncope, light-headedness and headaches.  Psychiatric/Behavioral: Negative for agitation, confusion, hallucinations and sleep disturbance. The patient is not nervous/anxious.     Immunization History  Administered Date(s) Administered  . Influenza-Unspecified 05/10/2015, 05/26/2016  . PPD Test 12/07/2016  . Pneumococcal Conjugate-13 11/06/2016  . Pneumococcal Polysaccharide-23 06/12/2012  . Zoster 09/09/2012   Pertinent  Health Maintenance Due  Topic Date Due  . OPHTHALMOLOGY EXAM  11/14/2016  . DEXA SCAN  11/06/2025 (Originally  08/21/1989)  . HEMOGLOBIN A1C  06/07/2017  . FOOT EXAM  08/04/2017  . INFLUENZA VACCINE  Completed  . PNA vac Low Risk Adult  Completed   Fall Risk  11/23/2016 11/06/2016 09/06/2016 05/30/2016 02/28/2016  Falls in the past year? No No No Yes No  Number falls in past yr: - - - 1 -  Injury with Fall? - - - No -  Risk for fall due to : - - - History of fall(s) -  Follow up - - - Education provided -    Vitals:   12/20/16 1153  BP: 128/74  Pulse: 70  Resp: 16  Temp: 98.1 F (36.7 C)  TempSrc: Oral  SpO2: 98%  Weight: 93 lb (42.2 kg)  Height: 5' (1.524 m)   Body mass index is 18.16 kg/m. Physical Exam  Constitutional: She is oriented to person, place, and time.  Thin frail elderly in no acute distress   HENT:  Head: Normocephalic.  Mouth/Throat: Oropharynx is clear and moist. No oropharyngeal exudate.  Eyes: Conjunctivae and EOM are normal. Pupils are equal, round, and reactive to light. Right eye exhibits no discharge. Left eye exhibits no discharge. No scleral icterus.  Neck: Normal range of  motion. No JVD present. No thyromegaly present.  Cardiovascular: Normal rate, regular rhythm, normal heart sounds and intact distal pulses.  Exam reveals no gallop and no friction rub.   No murmur heard. Pulmonary/Chest: Effort normal. No respiratory distress. She has no wheezes. She has no rales.  Bilateral lower lobes diminished to auscultation oxygen 2 Liters [place] in place.   Abdominal: Soft. Bowel sounds are normal. She exhibits no distension. There is no tenderness. There is no rebound and no guarding.  Genitourinary:  Genitourinary Comments: incontinent  Musculoskeletal: She exhibits no edema, tenderness or deformity.  Moves x 4 extremities. Unsteady gait   Lymphadenopathy:    She has no cervical adenopathy.  Neurological: She is oriented to person, place, and time.  Skin: Skin is warm and dry. No rash noted. No erythema. No pallor.  # 1. Right hip surgical incision honey comb dressing dry, clean and intact. Surrounding skin without any signs of infections.   # 2. Sacral ulcer wound bed pink without any signs of infections.   Psychiatric: She has a normal mood and affect.    Labs reviewed:  Recent Labs  12/05/16 0322 12/06/16 0511 12/07/16 0737 12/14/16  NA 139 144 138 139  K 3.2* 3.8 3.9 3.6  CL 108 115* 110  --   CO2 24 21* 21*  --   GLUCOSE 96 89 114*  --   BUN 43* 45* 44* 69*  CREATININE 1.65* 1.70* 1.66* 1.5*  CALCIUM 9.4 8.8* 8.8*  --     Recent Labs  11/06/16 1040 12/05/16 0322  AST 20 23  ALT 9 12*  ALKPHOS 98 91  BILITOT 0.4 0.7  PROT 6.6 6.3*  ALBUMIN 3.3* 2.8*    Recent Labs  11/09/16 0800 12/04/16 1410 12/05/16 0322 12/07/16 0737 12/14/16  WBC 4.2 6.9 3.4* 6.2 5.0  NEUTROABS 2.5  --   --   --   --   HGB 7.3* 10.2* 8.7* 8.8* 8.6*  HCT 23.8* 32.2* 27.0* 28.2* 28*  MCV 75.7* 85.0 83.0 87.1  --   PLT 260 171 140* 158 155   Lab Results  Component Value Date   TSH 0.531 02/06/2014   Lab Results  Component Value Date   HGBA1C 4.6 (L)  12/05/2016   Lab  Results  Component Value Date   CHOL 139 11/06/2016   HDL 41.50 11/06/2016   LDLCALC 75 11/06/2016   TRIG 115.0 11/06/2016   CHOLHDL 3 11/06/2016    Significant Diagnostic Results:  Portable Chest X-ray 2 views  Impression   Acute infiltrates in the right lower lobe and left lower lobe with small effusion and dense consolidation at the bases.  Assessment/Plan 1. Community acquired pneumonia, unspecified laterality CXR showed acute infiltrates in the right lower lobe and left lower lobe with small effusion and dense consolidation at the bases.Doxycyline 100 mg Tablet twice daily x 10 days. Florastor 250 capsule twice daily X 10 days for prophylaxis initiated. Shortness of breath improved this visit.Continue on oxygen 2 liters [place]. Wean off oxygen as tolerated.   2. CHF  Appears compensated. Small effusion noted on CXR. Will continue on Furosemide 40 mg Tablet daily, metoprolol, lisinopril and cardizem. Continue to monitor weight daily.     Family/ staff Communication: Reviewed plan of care with patient and facility Nurse supervisor  Labs/tests ordered: None   Sandrea Hughs, NP

## 2016-12-25 LAB — CBC AND DIFFERENTIAL
HEMATOCRIT: 36 % (ref 36–46)
Hemoglobin: 10.9 g/dL — AB (ref 12.0–16.0)
PLATELETS: 227 10*3/uL (ref 150–399)
WBC: 7 10^3/mL

## 2016-12-26 ENCOUNTER — Non-Acute Institutional Stay (SKILLED_NURSING_FACILITY): Payer: Medicare Other | Admitting: Family

## 2016-12-26 ENCOUNTER — Encounter: Payer: Self-pay | Admitting: Family

## 2016-12-26 DIAGNOSIS — I482 Chronic atrial fibrillation, unspecified: Secondary | ICD-10-CM

## 2016-12-26 DIAGNOSIS — I5032 Chronic diastolic (congestive) heart failure: Secondary | ICD-10-CM

## 2016-12-26 DIAGNOSIS — D509 Iron deficiency anemia, unspecified: Secondary | ICD-10-CM

## 2016-12-26 DIAGNOSIS — I10 Essential (primary) hypertension: Secondary | ICD-10-CM

## 2016-12-26 DIAGNOSIS — R2681 Unsteadiness on feet: Secondary | ICD-10-CM

## 2016-12-26 DIAGNOSIS — E1121 Type 2 diabetes mellitus with diabetic nephropathy: Secondary | ICD-10-CM

## 2016-12-26 DIAGNOSIS — S72001D Fracture of unspecified part of neck of right femur, subsequent encounter for closed fracture with routine healing: Secondary | ICD-10-CM

## 2016-12-26 NOTE — Progress Notes (Signed)
Location:  [place] Room Number: 683 Place of Service:  SNF [phone])  Provider: Marlowe Sax FNP-C   PCP: Sheral Flow, NP Patient Care Team: Pleas Koch, NP as PCP - General (Internal Medicine) Minna Merritts, MD as Consulting Physician (Cardiology) Edrick Kins, DPM as Consulting Physician (Podiatry) Leandrew Koyanagi, MD as Referring Physician (Ophthalmology)  Extended Emergency Contact Information Primary Emergency Contact: Twardowski,Charles Address: [place], [place] [address] Johnnette Litter of [place] Phone: [phone] Relation: Son Secondary Emergency Contact: Fawn Kirk States of [place] Phone: [phone] Relation: Other  Code Status: DNR Goals of care:  Advanced Directive information Advanced Directives 12/26/2016  Does Patient Have a Medical Advance Directive? Yes  Type of Advance Directive Out of facility DNR (pink MOST or yellow form)  Does patient want to make changes to medical advance directive? No - Patient declined  Copy of [place] in Chart? Yes  Pre-existing out of facility DNR order (yellow form or pink MOST form) -     Allergies  Allergen Reactions  . Alphagan [Brimonidine]     Arm numbness      Chief Complaint  Patient presents with  . Discharge Note    HPI:  83 y.o. female seen today at [facility] for discharge home.She was here for short term rehabilitation for post hospital admission from 12/04/2016-12/07/2016 with closed right hip fracture. She underwent right hip arthroplasty. She has a medical history of HTN, Afib,CHF, Type 2 DM, CKD stage 3,Hyperlipidemia, Glaucoma among other conditions.She is seen in her room today.She denies any acute issues this visit.During her stay here in rehab she was treated with a oral antibiotics pneumonia.Her stage 3 coccyx ulcer was managed by wound care Nurse.  She will require a [facility] RN for wound care management.she   has worked well with PT/OT now stable for discharge home.She will be discharged home with Home health PT/OT to continue with ROM, Exercise, Gait stability and muscle strengthening. She does not require any DME.Home health services will be arranged by facility social worker prior to discharge. Prescription medication will be written x 1 month then patient to follow up with PCP in 1-2 weeks.Facility staff report no new concerns.   Past Medical History:  Diagnosis Date  . A-fib ([place])   . Breast cancer ([place])    right  . CHF (congestive heart failure) ([place])   . Diabetes mellitus without complication ([place])   . Diverticulosis   . GERD (gastroesophageal reflux disease)   . Glaucoma   . H/O: GI bleed   . History of colonic polyps   . HTN (hypertension)   . Hyperlipidemia   . Palpitations   . PUD (peptic ulcer disease)   . PVD (peripheral vascular disease) ([place])     Past Surgical History:  Procedure Laterality Date  . HIP ARTHROPLASTY Right 12/05/2016   Procedure: ARTHROPLASTY BIPOLAR HIP (HEMIARTHROPLASTY);  Surgeon: Dereck Leep, MD;  Location: [facility] ORS;  Service: Orthopedics;  Laterality: Right;  . MASTECTOMY     right breast   . Open reduction and internal fixation of a left intertrochanteric femur fracture  2007   Dr. Leanor Kail  . RENAL ARTERY STENT    . VAGINAL HYSTERECTOMY        reports that she quit smoking about 29 years ago. Her smoking use included Cigarettes. She has a 50.00 pack-year smoking  history. She has never used smokeless tobacco. She reports that she does not drink alcohol or use drugs. Social History   Social History  . Marital status: Widowed    Spouse name: N/A  . Number of children: N/A  . Years of education: N/A   Occupational History  . Not on file.   Social History Main Topics  . Smoking status: Former Smoker    Packs/day: 2.00    Years: 25.00    Types: Cigarettes    Quit date:  11/07/1987  . Smokeless tobacco: Never Used  . Alcohol use No  . Drug use: No  . Sexual activity: No   Other Topics Concern  . Not on file   Social History Narrative   Widow   Retired. Once worked as a Materials engineer.   Enjoys cross word puzzles.     Allergies  Allergen Reactions  . Alphagan [Brimonidine]     Arm numbness      Pertinent  Health Maintenance Due  Topic Date Due  . OPHTHALMOLOGY EXAM  11/14/2016  . DEXA SCAN  11/06/2025 (Originally 08/21/1989)  . INFLUENZA VACCINE  04/25/2017  . HEMOGLOBIN A1C  06/07/2017  . FOOT EXAM  08/04/2017  . PNA vac Low Risk Adult  Completed    Medications: Allergies as of 12/26/2016      Reactions   Alphagan [brimonidine]    Arm numbness       Medication List       Accurate as of 12/26/16  6:26 PM. Always use your most recent med list.          acetaminophen 325 MG tablet Commonly known as:  TYLENOL Take 650 mg by mouth. Take two tablets three times daily   DECUBI-VITE PO Take by mouth daily.   diltiazem 120 MG 12 hr capsule Commonly known as:  CARDIZEM SR Take 120 mg by mouth daily.   dorzolamide 2 % ophthalmic solution Commonly known as:  TRUSOPT Place 1 drop into both eyes daily.   doxycycline 100 MG EC tablet Commonly known as:  DORYX Take 100 mg by mouth 2 (two) times daily.   enoxaparin 30 MG/0.3ML injection Commonly known as:  LOVENOX Inject 0.3 mLs (30 mg total) into the skin daily.   feeding supplement (PRO-STAT SUGAR FREE 64) Liqd Take 30 mLs by mouth daily.   furosemide 40 MG tablet Commonly known as:  LASIX Take 40 mg by mouth daily.   insulin lispro 100 UNIT/ML injection Commonly known as:  HUMALOG Inject 1-10 Units into the skin 4 (four) times daily -  before meals and at bedtime.   latanoprost 0.005 % ophthalmic solution Commonly known as:  XALATAN Place 1 drop into both eyes at bedtime.   lisinopril 10 MG tablet Commonly known as:  PRINIVIL,ZESTRIL Take 10 mg by mouth daily.     metoCLOPramide 10 MG tablet Commonly known as:  REGLAN Take 1 tablet (10 mg total) by mouth 4 (four) times daily -  before meals and at bedtime.   metoprolol tartrate 25 MG tablet Commonly known as:  LOPRESSOR Take 0.5 tablets (12.5 mg total) by mouth 2 (two) times daily.   omeprazole 20 MG capsule Commonly known as:  PRILOSEC TAKE ONE (1) CAPSULE BY MOUTH 2 TIMES DAILY BEFORE MEALS   POLY-IRON 150 150 MG capsule Generic drug:  iron polysaccharides TAKE ONE (1) CAPSULE BY MOUTH 2 TIMES DAILY   polyethylene glycol packet Commonly known as:  MIRALAX / GLYCOLAX Take 17 g by mouth  daily. 17 gram daily hold for loose stool   potassium chloride 10 MEQ tablet Commonly known as:  K-DUR,KLOR-CON Take 1 tablet by mouth Monday through Friday.   PROSTAT PO Take by mouth. 30 ml daily for 30 days   saccharomyces boulardii 250 MG capsule Commonly known as:  FLORASTOR Take 250 mg by mouth 2 (two) times daily.   senna-docusate 8.6-50 MG tablet Commonly known as:  Senokot-S Take 1 tablet by mouth 2 (two) times daily.   traMADol 50 MG tablet Commonly known as:  ULTRAM Take 50 mg by mouth. Take one tablet three times daily as needed for pain   UNABLE TO FIND Med Name: Med Pass 120 mL by mouth twice daily       Review of Systems  Constitutional: Negative for activity change, appetite change, chills, fatigue and fever.  HENT: Negative for congestion, rhinorrhea, sinus pain, sinus pressure, sneezing and sore throat.   Eyes: Negative.   Respiratory: Negative for cough, chest tightness and wheezing.   Cardiovascular: Negative for chest pain, palpitations and leg swelling.  Gastrointestinal: Negative for abdominal distention, abdominal pain, blood in stool, constipation, diarrhea, nausea, rectal pain and vomiting.  Endocrine: Negative.   Genitourinary: Negative for dysuria, flank pain, frequency, hematuria and urgency.  Musculoskeletal: Positive for gait problem.       Right hip pain  under control with current regimen  Skin: Negative for color change, pallor and rash.  Neurological: Negative for dizziness, seizures, syncope, light-headedness and headaches.  Hematological: Does not bruise/bleed easily.  Psychiatric/Behavioral: Negative for agitation, confusion, hallucinations and sleep disturbance. The patient is not nervous/anxious.     Vitals:   12/26/16 1012  BP: (!) 116/52  Pulse: 60  Resp: 20  Temp: (!) 96.3 F (35.7 C)  TempSrc: Oral  SpO2: 99%  Weight: 92 lb 12.8 oz (42.1 kg)  Height: 5' (1.524 m)   Body mass index is 18.12 kg/m. Physical Exam  Constitutional: She is oriented to person, place, and time.  Thin frail elderly in no acute distress   HENT:  Head: Normocephalic.  Mouth/Throat: Oropharynx is clear and moist. No oropharyngeal exudate.  Eyes: Conjunctivae and EOM are normal. Pupils are equal, round, and reactive to light. Right eye exhibits no discharge. Left eye exhibits no discharge. No scleral icterus.  Neck: Normal range of motion. No JVD present. No thyromegaly present.  Cardiovascular: Normal rate, regular rhythm, normal heart sounds and intact distal pulses.  Exam reveals no gallop and no friction rub.   No murmur heard. Pulmonary/Chest: Effort normal and breath sounds normal. No respiratory distress. She has no wheezes. She has no rales.  Abdominal: Soft. Bowel sounds are normal. She exhibits no distension. There is no tenderness. There is no rebound and no guarding.  Genitourinary:  Genitourinary Comments: incontinent  Musculoskeletal: She exhibits no edema, tenderness or deformity.  Moves x 4 extremities. Unsteady gait   Lymphadenopathy:    She has no cervical adenopathy.  Neurological: She is oriented to person, place, and time.  Skin: Skin is warm and dry. No rash noted. No erythema. No pallor.  # 1. Right hip surgical incision staples dry, clean and intact. Surrounding skin without any signs of infections.   # 2. Sacral ulcer  wound bed with yellow tissue no drainage noted. Surrounding skin tissue without any signs of infections.   Psychiatric: She has a normal mood and affect.    Labs reviewed: Basic Metabolic Panel:  Recent Labs  12/05/16 0322 12/06/16 0511 12/07/16 1275  12/14/16  NA 139 144 138 139  K 3.2* 3.8 3.9 3.6  CL 108 115* 110  --   CO2 24 21* 21*  --   GLUCOSE 96 89 114*  --   BUN 43* 45* 44* 69*  CREATININE 1.65* 1.70* 1.66* 1.5*  CALCIUM 9.4 8.8* 8.8*  --    Liver Function Tests:  Recent Labs  11/06/16 1040 12/05/16 0322  AST 20 23  ALT 9 12*  ALKPHOS 98 91  BILITOT 0.4 0.7  PROT 6.6 6.3*  ALBUMIN 3.3* 2.8*   CBC:  Recent Labs  11/09/16 0800 12/04/16 1410 12/05/16 0322 12/07/16 0737 12/14/16 12/25/16  WBC 4.2 6.9 3.4* 6.2 5.0 7.0  NEUTROABS 2.5  --   --   --   --   --   HGB 7.3* 10.2* 8.7* 8.8* 8.6* 10.9*  HCT 23.8* 32.2* 27.0* 28.2* 28* 36  MCV 75.7* 85.0 83.0 87.1  --   --   PLT 260 171 140* 158 155 227    Recent Labs  12/06/16 2122 12/07/16 0725 12/07/16 1223  GLUCAP 77 102* 130*    Assessment/Plan:   1. Unsteady gait  Has worked well with PT/ OT. Will discharge home PT/OT to continue with ROM, Exercise, Gait stability and muscle strengthening. No DME required. Fall and safety precautions.   2. Essential hypertension B/p stable. Continue on lisinopril, metoprolol,diltiazem and Furosemide. Recheck BMP in 1-2 weeks with PCP.   3. Chronic diastolic CHF (congestive heart failure) Appears compensated.Continue on lisinopril, metoprolol,diltiazem and Furosemide.   4. Chronic atrial fibrillation Heart rate controlled. Continue on metoprolol and diltiazem.    5. Controlled type 2 diabetes mellitus with diabetic nephropathy, without long-term current use of insulin  CBG's ranging in 100's-170's. Humalog Sliding scale discontinued during visit due to hypoglycemia. Continue to monitor CBG's. Hgb A1C with PCP.   6. Iron deficiency anemia Recent Hgb 10.9 (  12/25/2016).Previous Hgb 8.6 ( 12/14/2016). Continue on Poly-iron and MVI. CBC in 1-2 weeks with PCP.    7. Closed fracture of right hip with routine healing Incision site with staples intact without any signs of infections. Pain under control with Tramadol 50 mg Tablet one by mouth every 6 Hours as needed. Continue to wean off tramadol as tolerated. Follow up with Ortho as directed.will discharge with HH: PT/OT for ROM, Exercise, gait stability and muscle strengthening.     Patient is being discharged with the following home health services:   -PT/OT for ROM, exercise, gait stability and muscle strengthening  -  HH RN for wound care management   Patient is being discharged with the following durable medical equipment:   - No DME required  Patient has been advised to f/u with their PCP in 1-2 weeks to for a transitions of care visit.Social services at their facility was responsible for arranging this appointment.  Pt was provided with adequate prescriptions of noncontrolled medications to reach the scheduled appointment.For controlled substances, a limited supply was provided as appropriate for the individual patient. If the pt normally receives these medications from a pain clinic or has a contract with another physician, these medications should be received from that clinic or physician only).    Future labs/tests needed:  CBC, BMP in 1-2 weeks PCP

## 2016-12-31 ENCOUNTER — Emergency Department: Payer: Medicare Other

## 2016-12-31 ENCOUNTER — Inpatient Hospital Stay
Admission: EM | Admit: 2016-12-31 | Discharge: 2017-01-23 | DRG: 871 | Disposition: E | Payer: Medicare Other | Attending: Specialist | Admitting: Specialist

## 2016-12-31 DIAGNOSIS — J9621 Acute and chronic respiratory failure with hypoxia: Secondary | ICD-10-CM | POA: Diagnosis present

## 2016-12-31 DIAGNOSIS — F0390 Unspecified dementia without behavioral disturbance: Secondary | ICD-10-CM | POA: Diagnosis present

## 2016-12-31 DIAGNOSIS — E1151 Type 2 diabetes mellitus with diabetic peripheral angiopathy without gangrene: Secondary | ICD-10-CM | POA: Diagnosis present

## 2016-12-31 DIAGNOSIS — J690 Pneumonitis due to inhalation of food and vomit: Secondary | ICD-10-CM | POA: Diagnosis present

## 2016-12-31 DIAGNOSIS — N179 Acute kidney failure, unspecified: Secondary | ICD-10-CM | POA: Diagnosis present

## 2016-12-31 DIAGNOSIS — R4182 Altered mental status, unspecified: Secondary | ICD-10-CM | POA: Diagnosis not present

## 2016-12-31 DIAGNOSIS — I4891 Unspecified atrial fibrillation: Secondary | ICD-10-CM | POA: Diagnosis present

## 2016-12-31 DIAGNOSIS — Z853 Personal history of malignant neoplasm of breast: Secondary | ICD-10-CM | POA: Diagnosis not present

## 2016-12-31 DIAGNOSIS — Z8711 Personal history of peptic ulcer disease: Secondary | ICD-10-CM | POA: Diagnosis not present

## 2016-12-31 DIAGNOSIS — E785 Hyperlipidemia, unspecified: Secondary | ICD-10-CM | POA: Diagnosis present

## 2016-12-31 DIAGNOSIS — I5032 Chronic diastolic (congestive) heart failure: Secondary | ICD-10-CM | POA: Diagnosis present

## 2016-12-31 DIAGNOSIS — Z86010 Personal history of colonic polyps: Secondary | ICD-10-CM | POA: Diagnosis not present

## 2016-12-31 DIAGNOSIS — E860 Dehydration: Secondary | ICD-10-CM | POA: Diagnosis present

## 2016-12-31 DIAGNOSIS — Z515 Encounter for palliative care: Secondary | ICD-10-CM | POA: Diagnosis present

## 2016-12-31 DIAGNOSIS — J90 Pleural effusion, not elsewhere classified: Secondary | ICD-10-CM

## 2016-12-31 DIAGNOSIS — Z823 Family history of stroke: Secondary | ICD-10-CM | POA: Diagnosis not present

## 2016-12-31 DIAGNOSIS — K219 Gastro-esophageal reflux disease without esophagitis: Secondary | ICD-10-CM | POA: Diagnosis present

## 2016-12-31 DIAGNOSIS — R627 Adult failure to thrive: Secondary | ICD-10-CM | POA: Diagnosis present

## 2016-12-31 DIAGNOSIS — Z87891 Personal history of nicotine dependence: Secondary | ICD-10-CM | POA: Diagnosis not present

## 2016-12-31 DIAGNOSIS — Z90710 Acquired absence of both cervix and uterus: Secondary | ICD-10-CM | POA: Diagnosis not present

## 2016-12-31 DIAGNOSIS — K559 Vascular disorder of intestine, unspecified: Secondary | ICD-10-CM

## 2016-12-31 DIAGNOSIS — Z96641 Presence of right artificial hip joint: Secondary | ICD-10-CM | POA: Diagnosis present

## 2016-12-31 DIAGNOSIS — I110 Hypertensive heart disease with heart failure: Secondary | ICD-10-CM | POA: Diagnosis present

## 2016-12-31 DIAGNOSIS — A419 Sepsis, unspecified organism: Principal | ICD-10-CM | POA: Diagnosis present

## 2016-12-31 DIAGNOSIS — Z66 Do not resuscitate: Secondary | ICD-10-CM | POA: Diagnosis present

## 2016-12-31 DIAGNOSIS — Z9011 Acquired absence of right breast and nipple: Secondary | ICD-10-CM

## 2016-12-31 DIAGNOSIS — H409 Unspecified glaucoma: Secondary | ICD-10-CM | POA: Diagnosis present

## 2016-12-31 DIAGNOSIS — I959 Hypotension, unspecified: Secondary | ICD-10-CM

## 2016-12-31 DIAGNOSIS — N19 Unspecified kidney failure: Secondary | ICD-10-CM

## 2016-12-31 LAB — URINALYSIS, ROUTINE W REFLEX MICROSCOPIC
BILIRUBIN URINE: NEGATIVE
GLUCOSE, UA: NEGATIVE mg/dL
HGB URINE DIPSTICK: NEGATIVE
Ketones, ur: NEGATIVE mg/dL
Leukocytes, UA: NEGATIVE
Nitrite: NEGATIVE
PROTEIN: NEGATIVE mg/dL
Specific Gravity, Urine: 1.017 (ref 1.005–1.030)
pH: 5 (ref 5.0–8.0)

## 2016-12-31 LAB — PROTIME-INR
INR: 1.9
Prothrombin Time: 22.1 seconds — ABNORMAL HIGH (ref 11.4–15.2)

## 2016-12-31 LAB — APTT: aPTT: 30 seconds (ref 24–36)

## 2016-12-31 LAB — CBC WITH DIFFERENTIAL/PLATELET
Basophils Absolute: 0 10*3/uL (ref 0–0.1)
Basophils Relative: 0 %
EOS ABS: 0 10*3/uL (ref 0–0.7)
Eosinophils Relative: 0 %
HCT: 37.5 % (ref 35.0–47.0)
Hemoglobin: 11.8 g/dL — ABNORMAL LOW (ref 12.0–16.0)
LYMPHS ABS: 0.2 10*3/uL — AB (ref 1.0–3.6)
LYMPHS PCT: 5 %
MCH: 28.3 pg (ref 26.0–34.0)
MCHC: 31.4 g/dL — ABNORMAL LOW (ref 32.0–36.0)
MCV: 90.2 fL (ref 80.0–100.0)
MONO ABS: 0.1 10*3/uL — AB (ref 0.2–0.9)
Monocytes Relative: 2 %
Neutro Abs: 4.7 10*3/uL (ref 1.4–6.5)
Neutrophils Relative %: 93 %
PLATELETS: 207 10*3/uL (ref 150–440)
RBC: 4.16 MIL/uL (ref 3.80–5.20)
RDW: 22.1 % — ABNORMAL HIGH (ref 11.5–14.5)
WBC: 5 10*3/uL (ref 3.6–11.0)

## 2016-12-31 LAB — LACTIC ACID, PLASMA
LACTIC ACID, VENOUS: 5 mmol/L — AB (ref 0.5–1.9)
Lactic Acid, Venous: 6.7 mmol/L (ref 0.5–1.9)

## 2016-12-31 LAB — COMPREHENSIVE METABOLIC PANEL
ALT: 14 U/L (ref 14–54)
AST: 35 U/L (ref 15–41)
Albumin: 1.8 g/dL — ABNORMAL LOW (ref 3.5–5.0)
Alkaline Phosphatase: 132 U/L — ABNORMAL HIGH (ref 38–126)
Anion gap: 11 (ref 5–15)
BILIRUBIN TOTAL: 0.9 mg/dL (ref 0.3–1.2)
BUN: 123 mg/dL — AB (ref 6–20)
CHLORIDE: 109 mmol/L (ref 101–111)
CO2: 17 mmol/L — ABNORMAL LOW (ref 22–32)
CREATININE: 2.49 mg/dL — AB (ref 0.44–1.00)
Calcium: 9.4 mg/dL (ref 8.9–10.3)
GFR, EST AFRICAN AMERICAN: 18 mL/min — AB (ref >60)
GFR, EST NON AFRICAN AMERICAN: 16 mL/min — AB (ref >60)
Glucose, Bld: 123 mg/dL — ABNORMAL HIGH (ref 65–99)
Potassium: 4.5 mmol/L (ref 3.5–5.1)
Sodium: 137 mmol/L (ref 135–145)
TOTAL PROTEIN: 5.4 g/dL — AB (ref 6.5–8.1)

## 2016-12-31 LAB — LIPASE, BLOOD: LIPASE: 20 U/L (ref 11–51)

## 2016-12-31 LAB — TROPONIN I: Troponin I: 0.03 ng/mL (ref <0.03)

## 2016-12-31 MED ORDER — MORPHINE SULFATE (PF) 2 MG/ML IV SOLN
2.0000 mg | INTRAVENOUS | Status: DC | PRN
  Administered 2016-12-31: 2 mg via INTRAVENOUS
  Filled 2016-12-31: qty 1

## 2016-12-31 MED ORDER — SODIUM CHLORIDE 0.9 % IV SOLN
1000.0000 mL | Freq: Once | INTRAVENOUS | Status: AC
  Administered 2016-12-31: 1000 mL via INTRAVENOUS

## 2016-12-31 MED ORDER — GLYCOPYRROLATE 0.2 MG/ML IJ SOLN: 0.1000 mg | Freq: Four times a day (QID) | INTRAMUSCULAR | Status: DC | PRN

## 2016-12-31 MED ORDER — ACETAMINOPHEN 650 MG RE SUPP: 650.0000 mg | Freq: Four times a day (QID) | RECTAL | Status: DC | PRN

## 2016-12-31 MED ORDER — SODIUM CHLORIDE 0.9 % IV SOLN
1.0000 mg/h | INTRAVENOUS | Status: DC
  Administered 2016-12-31: 1 mg/h via INTRAVENOUS
  Filled 2016-12-31: qty 10

## 2016-12-31 MED ORDER — ONDANSETRON HCL 4 MG PO TABS: 4.0000 mg | ORAL_TABLET | Freq: Four times a day (QID) | ORAL | Status: DC | PRN

## 2016-12-31 MED ORDER — ONDANSETRON HCL 4 MG/2ML IJ SOLN: 4.0000 mg | Freq: Four times a day (QID) | INTRAMUSCULAR | Status: DC | PRN

## 2016-12-31 MED ORDER — ACETAMINOPHEN 325 MG PO TABS: 650.0000 mg | ORAL_TABLET | Freq: Four times a day (QID) | ORAL | Status: DC | PRN

## 2016-12-31 MED ORDER — SODIUM CHLORIDE 0.9% FLUSH: 3.0000 mL | Freq: Two times a day (BID) | INTRAVENOUS | Status: DC

## 2016-12-31 MED ORDER — LORAZEPAM 2 MG/ML IJ SOLN
1.0000 mg | INTRAMUSCULAR | Status: DC | PRN
  Administered 2016-12-31: 1 mg via INTRAVENOUS
  Filled 2016-12-31: qty 1

## 2016-12-31 MED ORDER — ORAL CARE MOUTH RINSE: 15.0000 mL | Freq: Two times a day (BID) | OROMUCOSAL | Status: DC

## 2016-12-31 NOTE — Progress Notes (Signed)
New admit from ED accompanied by her son and dgt in law for comfort care. Hypotensive with pt having some cyanosis of fingers/feet with coolness. Unable to obtain pulse ox. Morphine drip continued with pt having relaxed comfortable appearance with respirations quiet and shallow. No signs discomfort. Mildly responded to turning only for care. Pressure ulcer care performed. Staples remain in right THR repair. Pt had only been home few days from rehab. Dr. Verdell Carmine notified of critical lactic acid with no more to be done.

## 2016-12-31 NOTE — ED Triage Notes (Signed)
Pt presents via EMS with AMS and hypotension.

## 2016-12-31 NOTE — ED Provider Notes (Signed)
[facility] Emergency Department Provider Note   ____________________________________________    I have reviewed the triage vital signs and the nursing notes.   HISTORY  Chief Complaint Altered Mental Status  Patient unable to provide history   HPI Sharon Frazier is a 83 y.o. female who presents with altered mental status. Family reports decreased by mouth intake over the last several days and essentially unresponsive today. No fevers reported.   Past Medical History:  Diagnosis Date  . A-fib ([place])   . Breast cancer ([place])    right  . CHF (congestive heart failure) ([place])   . Diabetes mellitus without complication ([place])   . Diverticulosis   . GERD (gastroesophageal reflux disease)   . Glaucoma   . H/O: GI bleed   . History of colonic polyps   . HTN (hypertension)   . Hyperlipidemia   . Palpitations   . PUD (peptic ulcer disease)   . PVD (peripheral vascular disease) [facility])     Patient Active Problem List   Diagnosis Date Noted  . Closed right hip fracture ([place]) 12/04/2016  . Iron deficiency anemia due to chronic blood loss 11/09/2016  . Essential hypertension 03/08/2016  . Chronic diastolic CHF (congestive heart failure) ([place]) 06/19/2014  . A-fib ([place]) 12/25/2013  . Diabetes mellitus type 2, controlled ([place]) 12/25/2013  . Anemia 12/25/2013  . GI bleed 12/25/2013  . Hyperlipidemia 12/25/2013    Past Surgical History:  Procedure Laterality Date  . HIP ARTHROPLASTY Right 12/05/2016   Procedure: ARTHROPLASTY BIPOLAR HIP (HEMIARTHROPLASTY);  Surgeon: Dereck Leep, MD;  Location: [facility] ORS;  Service: Orthopedics;  Laterality: Right;  . MASTECTOMY     right breast   . Open reduction and internal fixation of a left intertrochanteric femur fracture  2007   Dr. Leanor Kail  . RENAL ARTERY STENT    . VAGINAL HYSTERECTOMY      Prior to Admission medications   Medication Sig Start Date End Date Taking? Authorizing Provider    acetaminophen (TYLENOL) 325 MG tablet Take 650 mg by mouth. Take two tablets three times daily   Yes Historical Provider, MD  diltiazem (CARDIZEM SR) 120 MG 12 hr capsule Take 120 mg by mouth daily.   Yes Historical Provider, MD  dorzolamide (TRUSOPT) 2 % ophthalmic solution Place 1 drop into both eyes daily.    Yes Historical Provider, MD  furosemide (LASIX) 40 MG tablet Take 40 mg by mouth daily. Monday-Friday only   Yes Historical Provider, MD  latanoprost (XALATAN) 0.005 % ophthalmic solution Place 1 drop into both eyes at bedtime.   Yes Historical Provider, MD  lisinopril (PRINIVIL,ZESTRIL) 10 MG tablet Take 10 mg by mouth daily.   Yes Historical Provider, MD  metoCLOPramide (REGLAN) 10 MG tablet Take 1 tablet (10 mg total) by mouth 4 (four) times daily -  before meals and at bedtime. 12/07/16  Yes Nicholes Mango, MD  metoprolol tartrate (LOPRESSOR) 25 MG tablet Take 0.5 tablets (12.5 mg total) by mouth 2 (two) times daily. 11/06/16  Yes Pleas Koch, NP  POLY-IRON 150 150 MG capsule TAKE ONE (1) CAPSULE BY MOUTH 2 TIMES DAILY Patient taking differently: TAKE ONE (1) CAPSULE BY MOUTH 2 TIMES DAILY monday-friday 11/20/16  Yes Pleas Koch, NP  potassium chloride (K-DUR,KLOR-CON) 10 MEQ tablet Take 1 tablet by mouth Monday through Friday. 11/06/16  Yes Pleas Koch, NP  traMADol (ULTRAM) 50 MG tablet Take 50 mg by mouth. Take one tablet three times  daily as needed for pain   Yes Historical Provider, MD  Amino Acids-Protein Hydrolys (FEEDING SUPPLEMENT, PRO-STAT SUGAR FREE 64,) LIQD Take 30 mLs by mouth daily. 12/12/16 01/12/17  Historical Provider, MD  enoxaparin (LOVENOX) 30 MG/0.3ML injection Inject 0.3 mLs (30 mg total) into the skin daily. Patient not taking: Reported on 01/08/2017 12/08/16 12/22/16  Nicholes Mango, MD  omeprazole (PRILOSEC) 20 MG capsule TAKE ONE (1) CAPSULE BY MOUTH 2 TIMES DAILY BEFORE MEALS Patient not taking: Reported on 01/15/2017 10/16/16   Pleas Koch, NP   senna-docusate (SENOKOT-S) 8.6-50 MG tablet Take 1 tablet by mouth 2 (two) times daily. Patient not taking: Reported on 01/13/2017 12/07/16   Nicholes Mango, MD  UNABLE TO FIND Med Name: Med Pass 120 mL by mouth twice daily    Historical Provider, MD     Allergies Alphagan [brimonidine]  Family History  Problem Relation Age of Onset  . Stroke Mother   . Aneurysm Father   . Cancer Sister     Colon cancer    Social History Social History  Substance Use Topics  . Smoking status: Former Smoker    Packs/day: 2.00    Years: 25.00    Types: Cigarettes    Quit date: 11/07/1987  . Smokeless tobacco: Never Used  . Alcohol use No    Level V caveat: Unable to obtain Review of Systems due to altered mental status     ____________________________________________   PHYSICAL EXAM:  VITAL SIGNS: ED Triage Vitals  Enc Vitals Group     BP 12/25/2016 1253 (!) 83/57     Pulse --      Resp --      Temp 01/11/2017 1253 (!) 95 F (35 C)     Temp Source 01/22/2017 1253 Oral     SpO2 --      Weight 01/13/2017 1312 100 lb (45.4 kg)     Height 01/14/2017 1312 5\' 3"  (1.6 m)     Head Circumference --      Peak Flow --      Pain Score --      Pain Loc --      Pain Edu? --      Excl. in [place]? --     Constitutional: Unresponsive Eyes: Conjunctivae are normal.  Head: Atraumatic.  Mouth/Throat: Mucous membranes are dry    Cardiovascular: Normal rate, regular rhythm. Grossly normal heart sounds.  Good peripheral circulation. Respiratory: Normal respiratory effort.  Decreased lung sounds on the left Gastrointestinal: Positive distention, soft but tender  lower abdomen, guaiac positive dark stool  Musculoskeletal: Warm and well perfused Neurologic: Unable to examine Skin:  Skin is warm, dry and intact. No rash noted.   ____________________________________________   LABS (all labs ordered are listed, but only abnormal results are displayed)  Labs Reviewed  LACTIC ACID, PLASMA - Abnormal;  Notable for the following:       Result Value   Lactic Acid, Venous 5.0 (*)    All other components within normal limits  COMPREHENSIVE METABOLIC PANEL - Abnormal; Notable for the following:    CO2 17 (*)    Glucose, Bld 123 (*)    BUN 123 (*)    Creatinine, Ser 2.49 (*)    Total Protein 5.4 (*)    Albumin 1.8 (*)    Alkaline Phosphatase 132 (*)    GFR calc non Af Amer 16 (*)    GFR calc Af Amer 18 (*)    All other components within normal  limits  CBC WITH DIFFERENTIAL/PLATELET - Abnormal; Notable for the following:    Hemoglobin 11.8 (*)    MCHC 31.4 (*)    RDW 22.1 (*)    Lymphs Abs 0.2 (*)    Monocytes Absolute 0.1 (*)    All other components within normal limits  URINALYSIS, ROUTINE W REFLEX MICROSCOPIC - Abnormal; Notable for the following:    Color, Urine AMBER (*)    APPearance HAZY (*)    All other components within normal limits  TROPONIN I - Abnormal; Notable for the following:    Troponin I 0.03 (*)    All other components within normal limits  PROTIME-INR - Abnormal; Notable for the following:    Prothrombin Time 22.1 (*)    All other components within normal limits  CULTURE, BLOOD (ROUTINE X 2)  CULTURE, BLOOD (ROUTINE X 2)  URINE CULTURE  LIPASE, BLOOD  APTT  LACTIC ACID, PLASMA  TYPE AND SCREEN   ____________________________________________  EKG  ED ECG REPORT I, Lavonia Drafts, the attending physician, personally viewed and interpreted this ECG.  Date: 01/16/2017 EKG Time: 1:33 PM Rate: 86 Rhythm: Atrial fibrillation QRS Axis: normal Intervals: Prolonged QT ST/T Wave abnormalities: normal Conduction Disturbances: none   ____________________________________________  RADIOLOGY  CT head unremarkable Chest x-ray with large left pleural effusion ____________________________________________   PROCEDURES  Procedure(s) performed: No    Critical Care performed: No ____________________________________________   INITIAL IMPRESSION /  ASSESSMENT AND PLAN / ED COURSE  Pertinent labs & imaging results that were available during my care of the patient were reviewed by me and considered in my medical decision making (see chart for details).  Patient presents hypotensive and hypothermic unresponsive. She is critically ill. Her abdomen is distended and she has dark stool which is guaiac positive. Unclear whether she is septic or has had any GI bleed, pending labs.   Lab work demonstrates significant uremia and dehydration. This may be the cause of her hypotension. I suspect her lactic acid is related to ischemic colitis secondary to her dehydration status hypotension which is causing her GI bleeding. No indication for antibiotics at this time.  Chest x-ray demonstrates large left pleural effusion. I discussed with the son who is her HCPOA that patient is critically ill. We discussed options for her including central line and antibiotics and continued fluids. He does not want to perform these interventions, he just wants her to be comfortable. Hence we discussed comfort care and he agrees with morphine drip    ____________________________________________   FINAL CLINICAL IMPRESSION(S) / ED DIAGNOSES  Final diagnoses:  Uremia  Hypotension, unspecified hypotension type  Pleural effusion  Ischemic colitis ([place]      NEW MEDICATIONS STARTED DURING THIS VISIT:  New Prescriptions   No medications on file     Note:  This document was prepared using Dragon voice recognition software and may include unintentional dictation errors.    Lavonia Drafts, MD 12/26/2016 [phone]

## 2016-12-31 NOTE — H&P (Signed)
[facility] [place] NAME: Sharon Frazier    MR#:  [id number]  DATE OF BIRTH:  [birth year]  DATE OF ADMISSION:  01/07/2017  PRIMARY CARE PHYSICIAN: Sheral Flow, NP   REQUESTING/REFERRING PHYSICIAN: Dr. Lavonia Drafts  CHIEF COMPLAINT:   Chief Complaint  Patient presents with  . Altered Mental Status    HISTORY OF PRESENT ILLNESS:  Sharon Frazier  is a 83 y.o. female with a known history of HIV fibrillation, breast cancer, CHF, dementia, diverticulosis, GERD, glaucoma, hyperlipidemia, hypertension, peripheral vascular disease, recent hospitalization for a right hip fracture who now presents to the hospital due to weakness and altered mental status. Patient herself is a very poor historian therefore most of the history obtained from the son at bedside. Patient just came home from a skilled nursing facility about 4-5 days ago and her appetite has been significantly poor and she has been very lethargic and moaning at home. As she was not improving the son brought her to the ER for further evaluation. In the emergency room patient underwent a workup which showed a left-sided pleural effusion with possible pneumonia, she was noted to be severely hypotensive with sepsis, and acute kidney injury. Given her advanced age and multiple comorbidities the patient's son did not want to pursue aggressive care and therefore patient is being admitted for [place].     PAST MEDICAL HISTORY:   Past Medical History:  Diagnosis Date  . A-fib ([place])   . Breast cancer ([place])    right  . CHF (congestive heart failure) ([place])   . Diabetes mellitus without complication ([place])   . Diverticulosis   . GERD (gastroesophageal reflux disease)   . Glaucoma   . H/O: GI bleed   . History of colonic polyps   . HTN (hypertension)   . Hyperlipidemia   . Palpitations   . PUD (peptic ulcer disease)   . PVD (peripheral vascular disease) ([place])     PAST  SURGICAL HISTORY:   Past Surgical History:  Procedure Laterality Date  . HIP ARTHROPLASTY Right 12/05/2016   Procedure: ARTHROPLASTY BIPOLAR HIP (HEMIARTHROPLASTY);  Surgeon: Dereck Leep, MD;  Location: [facility] ORS;  Service: Orthopedics;  Laterality: Right;  . MASTECTOMY     right breast   . Open reduction and internal fixation of a left intertrochanteric femur fracture  2007   Dr. Leanor Kail  . RENAL ARTERY STENT    . VAGINAL HYSTERECTOMY      SOCIAL HISTORY:   Social History  Substance Use Topics  . Smoking status: Former Smoker    Packs/day: 2.00    Years: 25.00    Types: Cigarettes    Quit date: 11/07/1987  . Smokeless tobacco: Never Used  . Alcohol use No    FAMILY HISTORY:   Family History  Problem Relation Age of Onset  . Stroke Mother   . Aneurysm Father   . Cancer Sister     Colon cancer    DRUG ALLERGIES:   Allergies  Allergen Reactions  . Alphagan [Brimonidine]     Arm numbness      REVIEW OF SYSTEMS:   Review of Systems  Unable to perform ROS: Dementia    MEDICATIONS AT HOME:   Prior to Admission medications   Medication Sig Start Date End Date Taking? Authorizing Provider  acetaminophen (TYLENOL) 325 MG tablet Take 650 mg by mouth. Take two tablets three times daily   Yes Historical Provider, MD  diltiazem (  CARDIZEM SR) 120 MG 12 hr capsule Take 120 mg by mouth daily.   Yes Historical Provider, MD  dorzolamide (TRUSOPT) 2 % ophthalmic solution Place 1 drop into both eyes daily.    Yes Historical Provider, MD  furosemide (LASIX) 40 MG tablet Take 40 mg by mouth daily. Monday-Friday only   Yes Historical Provider, MD  latanoprost (XALATAN) 0.005 % ophthalmic solution Place 1 drop into both eyes at bedtime.   Yes Historical Provider, MD  lisinopril (PRINIVIL,ZESTRIL) 10 MG tablet Take 10 mg by mouth daily.   Yes Historical Provider, MD  metoCLOPramide (REGLAN) 10 MG tablet Take 1 tablet (10 mg total) by mouth 4 (four) times daily -  before  meals and at bedtime. 12/07/16  Yes Nicholes Mango, MD  metoprolol tartrate (LOPRESSOR) 25 MG tablet Take 0.5 tablets (12.5 mg total) by mouth 2 (two) times daily. 11/06/16  Yes Pleas Koch, NP  POLY-IRON 150 150 MG capsule TAKE ONE (1) CAPSULE BY MOUTH 2 TIMES DAILY Patient taking differently: TAKE ONE (1) CAPSULE BY MOUTH 2 TIMES DAILY monday-friday 11/20/16  Yes Pleas Koch, NP  potassium chloride (K-DUR,KLOR-CON) 10 MEQ tablet Take 1 tablet by mouth Monday through Friday. 11/06/16  Yes Pleas Koch, NP  traMADol (ULTRAM) 50 MG tablet Take 50 mg by mouth. Take one tablet three times daily as needed for pain   Yes Historical Provider, MD  Amino Acids-Protein Hydrolys (FEEDING SUPPLEMENT, PRO-STAT SUGAR FREE 64,) LIQD Take 30 mLs by mouth daily. 12/12/16 01/12/17  Historical Provider, MD  enoxaparin (LOVENOX) 30 MG/0.3ML injection Inject 0.3 mLs (30 mg total) into the skin daily. Patient not taking: Reported on 12/28/2016 12/08/16 12/22/16  Nicholes Mango, MD  omeprazole (PRILOSEC) 20 MG capsule TAKE ONE (1) CAPSULE BY MOUTH 2 TIMES DAILY BEFORE MEALS Patient not taking: Reported on 12/26/2016 10/16/16   Pleas Koch, NP  senna-docusate (SENOKOT-S) 8.6-50 MG tablet Take 1 tablet by mouth 2 (two) times daily. Patient not taking: Reported on 12/30/2016 12/07/16   Nicholes Mango, MD  UNABLE TO FIND Med Name: Med Pass 120 mL by mouth twice daily    Historical Provider, MD      VITAL SIGNS:  Blood pressure (!) 98/56, pulse (!) 43, temperature (!) 95 F (35 C), temperature source Oral, resp. rate (!) 25, height 5\' 3"  (1.6 m), weight 45.4 kg (100 lb), SpO2 92 %.  PHYSICAL EXAMINATION:  Physical Exam  GENERAL:  83 y.o.-year-old patient lying in bed moaning in some pain.  EYES: Pupils equal, round, reactive to light. No scleral icterus. Extraocular muscles intact.  HEENT: Head atraumatic, normocephalic. Oropharynx and nasopharynx clear. No oropharyngeal erythema, dry oral mucosa  NECK:  Supple, no  jugular venous distention. No thyroid enlargement, no tenderness.  LUNGS: Poor Resp. effort, no wheezing, rales, rhonchi. No use of accessory muscles of respiration.  CARDIOVASCULAR: S1, S2 RRR. No murmurs, rubs, gallops, clicks.  ABDOMEN: Soft, nontender, nondistended. Bowel sounds present. No organomegaly or mass.  EXTREMITIES: No pedal edema, cyanosis, or clubbing. + 2 pedal & radial pulses b/l.  Right Hip staples in place from recent hip replacement.  NEUROLOGIC: Cranial nerves II through XII are intact. No focal Motor or sensory deficits appreciated b/l. Globally weak.  PSYCHIATRIC: The patient is alert and oriented x 1.   SKIN: No obvious rash, lesion, or ulcer.   LABORATORY PANEL:   CBC  Recent Labs Lab 12/31/16 1316  WBC 5.0  HGB 11.8*  HCT 37.5  PLT 207   ------------------------------------------------------------------------------------------------------------------  Chemistries   Recent Labs Lab 01/15/2017 1316  NA 137  K 4.5  CL 109  CO2 17*  GLUCOSE 123*  BUN 123*  CREATININE 2.49*  CALCIUM 9.4  AST 35  ALT 14  ALKPHOS 132*  BILITOT 0.9   ------------------------------------------------------------------------------------------------------------------  Cardiac Enzymes  Recent Labs Lab 12/26/2016 1316  TROPONINI 0.03*   ------------------------------------------------------------------------------------------------------------------  RADIOLOGY:  Ct Abdomen Pelvis Wo Contrast  Result Date: 01/08/2017 CLINICAL DATA:  Abdominal distention. Left pleural effusion. Hypotension. Several weeks postop from right hip replacement. EXAM: CT CHEST, ABDOMEN AND PELVIS WITHOUT CONTRAST TECHNIQUE: Multidetector CT imaging of the chest, abdomen and pelvis was performed following the standard protocol without IV contrast. COMPARISON:  Chest CT only on 04/20/2014 FINDINGS: CT CHEST FINDINGS Cardiovascular: Stable mild cardiomegaly. Aortic and coronary artery  atherosclerosis. Mediastinum/Lymph Nodes: No masses or pathologically enlarged lymph nodes identified on this unenhanced exam. Lungs/Pleura: Large left pleural effusion is seen, with atelectasis or infiltrate within the lingula and left lower lobe. Opacification of the left mainstem and central lower lobe bronchi are seen, likely due to aspiration. Mild subsegmental atelectasis seen in the anterior right upper lobe. Tiny right pleural effusion also noted. Musculoskeletal:  No suspicious bone lesions identified. CT ABDOMEN AND PELVIS FINDINGS Hepatobiliary: Diffuse hepatic cirrhosis. No masses visualized on this unenhanced exam. Large amount of ascites seen throughout the abdomen and pelvis. Pancreas: No mass or inflammatory changes identified on this unenhanced exam. Spleen: Within normal limits in size. Adrenals/Urinary Tract: No evidence of urolithiasis or hydronephrosis. Urinary bladder obscured by beam hardening artifact from bilateral hip surgical hardware. Stomach/Bowel: Small hiatal hernia. No evidence of obstruction, inflammatory process, or abnormal fluid collections. Diverticulosis seen involving the descending and sigmoid colon, without evidence of diverticulitis. Vascular/Lymphatic: No pathologically enlarged lymph nodes identified. No abdominal aortic aneurysm. Aorto bi-iliac stent seen in place. Aortic atherosclerosis. Reproductive: Prior hysterectomy. Poor visualization due to beam hardening artifact from bilateral hip surgical hardware. Other:  None. Musculoskeletal: No suspicious bone lesions identified. Old osteopenic compression fracture deformities of T9, L1, and L2 vertebral bodies noted. IMPRESSION: Large left pleural effusion, with atelectasis or infiltrate involving the lingula and left lower lobe. Opacification of left main stem and lower lobe bronchi, likely due to aspiration. Hepatic cirrhosis and large amount of ascites. Small hiatal hernia. Colonic diverticulosis. No radiographic  evidence of diverticulitis. Aortic and coronary artery atherosclerosis. Electronically Signed   By: Earle Gell M.D.   On: 12/26/2016 14:59   Dg Abdomen 1 View  Result Date: 12/27/2016 CLINICAL DATA:  Altered mental status, hypotension. Right hip arthroplasty on 12/05/2016. EXAM: ABDOMEN - 1 VIEW COMPARISON:  None. FINDINGS: Paucity of bowel gas within the visualized portion of the abdomen and pelvis. No dilated bowel loops seen. Overall density may indicate underlying ascites. Right hip arthroplasty, partially imaged, with overlying surgical staples. Additional fixation screw within the left femoral head/ neck. Degenerative changes within the thoracolumbar spine, at least moderate in degree. No acute or suspicious osseous finding. IMPRESSION: 1. Paucity of bowel gas. No dilated large or small bowel loops identified. 2. Overall density of the abdomen raises the possibility of ascites. 3. Surgical changes related to recent right hip arthroplasty. Electronically Signed   By: Franki Cabot M.D.   On: 12/26/2016 14:31   Ct Head Wo Contrast  Result Date: 01/15/2017 CLINICAL DATA:  Altered mental status change. EXAM: CT HEAD WITHOUT CONTRAST TECHNIQUE: Contiguous axial images were obtained from the base of the skull through the vertex without intravenous contrast. COMPARISON:  Feb 05, 2014 FINDINGS: Brain: No subdural, epidural, or subarachnoid hemorrhage. The ventricles are similar in the interval although the temporal horns of the lateral ventricle are mildly more prominent. The sulci are stable. Cerebellum, brainstem, and basal cisterns are normal. No mass, mass effect, or midline shift. White matter changes are again identified. These are most prominent in the right posterior parietal lobe extending into the right temporal lobe. No acute ischemia or infarct identified. Vascular: Calcified atherosclerosis is seen in the intracranial carotid arteries. Skull: Normal. Negative for fracture or focal lesion.  Sinuses/Orbits: No acute finding. Other: None. IMPRESSION: No acute intracranial abnormality or significant change. Electronically Signed   By: Dorise Bullion III M.D   On: 01/03/2017 14:06   Ct Chest Wo Contrast  Result Date: 01/20/2017 CLINICAL DATA:  Abdominal distention. Left pleural effusion. Hypotension. Several weeks postop from right hip replacement. EXAM: CT CHEST, ABDOMEN AND PELVIS WITHOUT CONTRAST TECHNIQUE: Multidetector CT imaging of the chest, abdomen and pelvis was performed following the standard protocol without IV contrast. COMPARISON:  Chest CT only on 04/20/2014 FINDINGS: CT CHEST FINDINGS Cardiovascular: Stable mild cardiomegaly. Aortic and coronary artery atherosclerosis. Mediastinum/Lymph Nodes: No masses or pathologically enlarged lymph nodes identified on this unenhanced exam. Lungs/Pleura: Large left pleural effusion is seen, with atelectasis or infiltrate within the lingula and left lower lobe. Opacification of the left mainstem and central lower lobe bronchi are seen, likely due to aspiration. Mild subsegmental atelectasis seen in the anterior right upper lobe. Tiny right pleural effusion also noted. Musculoskeletal:  No suspicious bone lesions identified. CT ABDOMEN AND PELVIS FINDINGS Hepatobiliary: Diffuse hepatic cirrhosis. No masses visualized on this unenhanced exam. Large amount of ascites seen throughout the abdomen and pelvis. Pancreas: No mass or inflammatory changes identified on this unenhanced exam. Spleen: Within normal limits in size. Adrenals/Urinary Tract: No evidence of urolithiasis or hydronephrosis. Urinary bladder obscured by beam hardening artifact from bilateral hip surgical hardware. Stomach/Bowel: Small hiatal hernia. No evidence of obstruction, inflammatory process, or abnormal fluid collections. Diverticulosis seen involving the descending and sigmoid colon, without evidence of diverticulitis. Vascular/Lymphatic: No pathologically enlarged lymph nodes  identified. No abdominal aortic aneurysm. Aorto bi-iliac stent seen in place. Aortic atherosclerosis. Reproductive: Prior hysterectomy. Poor visualization due to beam hardening artifact from bilateral hip surgical hardware. Other:  None. Musculoskeletal: No suspicious bone lesions identified. Old osteopenic compression fracture deformities of T9, L1, and L2 vertebral bodies noted. IMPRESSION: Large left pleural effusion, with atelectasis or infiltrate involving the lingula and left lower lobe. Opacification of left main stem and lower lobe bronchi, likely due to aspiration. Hepatic cirrhosis and large amount of ascites. Small hiatal hernia. Colonic diverticulosis. No radiographic evidence of diverticulitis. Aortic and coronary artery atherosclerosis. Electronically Signed   By: Earle Gell M.D.   On: 01/08/2017 14:59   Dg Chest Port 1 View  Result Date: 01/22/2017 CLINICAL DATA:  Altered mental status and hypotension. Right hip arthroplasty on 12/05/2016. History of atrial fibrillation, CHF, hypertension, diabetes, peptic ulcers, breast cancer with mastectomy. Former smoker. EXAM: PORTABLE CHEST 1 VIEW COMPARISON:  Chest x-rays dated 12/04/2016 and 04/23/2014. FINDINGS: Near complete opacification of the left hemithorax, likely related to large pleural effusion. Probable additional edema within the left lung apex. Right lung is clear. Heart is obscured by the overlying effusion and/or consolidation. Atherosclerotic changes again noted at the aortic arch. Paucity of bowel gas within the upper abdomen. No acute or suspicious osseous finding seen. IMPRESSION: 1. Near complete opacification of the left hemithorax, likely due  to large pleural effusion. 2. Aortic atherosclerosis. Electronically Signed   By: Franki Cabot M.D.   On: 01/15/2017 14:26     IMPRESSION AND PLAN:   83 year old female with past medical history of advanced dementia, hypertension, hyperlipidemia, peptic ulcer disease, peripheral vascular  disease, GERD, diabetes, history of breast cancer, history of atrial fibrillation, recent hospitalization for a right hip fracture who presents to the hospital from home due to altered mental status and weakness and poor by mouth intake.  1. Acute Renal Failure.  2. Acute Resp. Failure w/ Hypoxia.  3. Aspiration Pneumonia.  4. Sepsis.  5. Advanced Dementia.  6. HTN 7. Hyperlipidemia 8. PUD 9. GERD  Due to patient's advanced age and multiple comorbidities the patient's son did not want to pursue aggressive care and therefore patient is being admitted under [place].   - will place on Morphine gtt, Ativan PRN. Glycopyrrolate - Palliative Care consult, Care Management Consult.   Discussed plan of care with family at bedside.     All the records are reviewed and case discussed with ED provider. Management plans discussed with the patient, family and they are in agreement.  CODE STATUS: DNR/COMFORT CARE ONLY  TOTAL TIME TAKING CARE OF THIS PATIENT: 40 minutes.    Henreitta Leber M.D on 12/30/2016 at 3:29 PM  Between 7am to 6pm - Pager - [phone]  After 6pm go to [email or web address] - password [email or web address] [facility]  [facility]  Office  [phone]  CC: Primary care physician; Sheral Flow, NP

## 2017-01-01 ENCOUNTER — Telehealth: Payer: Self-pay

## 2017-01-01 LAB — URINE CULTURE: Culture: <10000 — AB

## 2017-01-01 LAB — BLOOD CULTURE ID PANEL (REFLEXED)
ACINETOBACTER BAUMANNII: NOT DETECTED
CANDIDA ALBICANS: NOT DETECTED
CANDIDA PARAPSILOSIS: NOT DETECTED
Candida glabrata: NOT DETECTED
Candida krusei: NOT DETECTED
Candida tropicalis: NOT DETECTED
ENTEROBACTER CLOACAE COMPLEX: NOT DETECTED
ENTEROBACTERIACEAE SPECIES: NOT DETECTED
ENTEROCOCCUS SPECIES: NOT DETECTED
Escherichia coli: NOT DETECTED
HAEMOPHILUS INFLUENZAE: NOT DETECTED
KLEBSIELLA OXYTOCA: NOT DETECTED
Klebsiella pneumoniae: NOT DETECTED
LISTERIA MONOCYTOGENES: NOT DETECTED
NEISSERIA MENINGITIDIS: NOT DETECTED
Proteus species: NOT DETECTED
Pseudomonas aeruginosa: NOT DETECTED
STREPTOCOCCUS AGALACTIAE: DETECTED — AB
STREPTOCOCCUS PYOGENES: NOT DETECTED
STREPTOCOCCUS SPECIES: DETECTED — AB
Serratia marcescens: NOT DETECTED
Staphylococcus aureus (BCID): NOT DETECTED
Staphylococcus species: NOT DETECTED
Streptococcus pneumoniae: NOT DETECTED

## 2017-01-03 LAB — TYPE AND SCREEN
ABO/RH(D): O POS
ANTIBODY SCREEN: POSITIVE
UNIT DIVISION: 0
UNIT DIVISION: 0

## 2017-01-03 LAB — CULTURE, BLOOD (ROUTINE X 2)

## 2017-01-03 LAB — BPAM RBC
BLOOD PRODUCT EXPIRATION DATE: 201804192359
BLOOD PRODUCT EXPIRATION DATE: 201804192359
Unit Type and Rh: 5100
Unit Type and Rh: 5100

## 2017-01-04 ENCOUNTER — Inpatient Hospital Stay: Payer: Medicare Other | Admitting: Oncology

## 2017-01-04 ENCOUNTER — Inpatient Hospital Stay: Payer: Medicare Other

## 2017-01-05 ENCOUNTER — Other Ambulatory Visit: Payer: Self-pay | Admitting: Nurse Practitioner

## 2017-01-09 ENCOUNTER — Encounter: Payer: Medicare Other | Admitting: Cardiovascular Disease

## 2017-01-11 ENCOUNTER — Ambulatory Visit: Payer: Medicare Other | Admitting: Primary Care

## 2017-01-23 NOTE — Telephone Encounter (Signed)
No additional questions, please thank her for calling.

## 2017-01-23 NOTE — Progress Notes (Signed)
Dr Marcille Blanco notified of expiration

## 2017-01-23 NOTE — Death Summary Note (Signed)
DEATH SUMMARY   Patient Details  Name: Sharon Frazier MRN: [id number] DOB: 02/28/1922  Admission/Discharge Information   Admit Date:  01-28-2017  Date of Death: Date of Death: Jan 29, 2017  Time of Death: Time of Death: 0130  Length of Stay: 1  Referring Physician: Sheral Flow, NP   Reason(s) for Hospitalization  Acute on chronic Respiratory Failure, Acute Kidney Injury, Adult Failure to Thrive.   Diagnoses  Preliminary cause of death:  Secondary Diagnoses (including complications and co-morbidities):  Active Problems:   Acute renal failure (ARF) [facility])   Brief Hospital Course (including significant findings, care, treatment, and services provided and events leading to death)  Sharon Frazier is a 83 y.o. year old female past medical history of advanced dementia, hypertension, hyperlipidemia, peptic ulcer disease, peripheral vascular disease, GERD, diabetes, history of breast cancer, history of atrial fibrillation, recent hospitalization for a right hip fracture who presents to the hospital from home due to altered mental status and weakness and poor by mouth intake.  1. Acute Renal Failure.  2. Acute Resp. Failure w/ Hypoxia.  3. Aspiration Pneumonia.  4. Sepsis.  5. Advanced Dementia.  6. HTN 7. Hyperlipidemia 8. PUD 9. GERD  Due to patient's advanced age and multiple comorbidities the patient's son did not want to pursue aggressive care and therefore patient was admitted under COMFORT CARE ONLY.   - she was placed on Morphine gtt and Ativan PRN along with Glycopyrrolate.    Patient passed away on the early morning of 01/29/2017, the family was made aware.    Pertinent Labs and Studies  Significant Diagnostic Studies Ct Abdomen Pelvis Wo Contrast  Result Date: 01/28/2017 CLINICAL DATA:  Abdominal distention. Left pleural effusion. Hypotension. Several weeks postop from right hip replacement. EXAM: CT CHEST, ABDOMEN AND PELVIS WITHOUT CONTRAST  TECHNIQUE: Multidetector CT imaging of the chest, abdomen and pelvis was performed following the standard protocol without IV contrast. COMPARISON:  Chest CT only on 04/20/2014 FINDINGS: CT CHEST FINDINGS Cardiovascular: Stable mild cardiomegaly. Aortic and coronary artery atherosclerosis. Mediastinum/Lymph Nodes: No masses or pathologically enlarged lymph nodes identified on this unenhanced exam. Lungs/Pleura: Large left pleural effusion is seen, with atelectasis or infiltrate within the lingula and left lower lobe. Opacification of the left mainstem and central lower lobe bronchi are seen, likely due to aspiration. Mild subsegmental atelectasis seen in the anterior right upper lobe. Tiny right pleural effusion also noted. Musculoskeletal:  No suspicious bone lesions identified. CT ABDOMEN AND PELVIS FINDINGS Hepatobiliary: Diffuse hepatic cirrhosis. No masses visualized on this unenhanced exam. Large amount of ascites seen throughout the abdomen and pelvis. Pancreas: No mass or inflammatory changes identified on this unenhanced exam. Spleen: Within normal limits in size. Adrenals/Urinary Tract: No evidence of urolithiasis or hydronephrosis. Urinary bladder obscured by beam hardening artifact from bilateral hip surgical hardware. Stomach/Bowel: Small hiatal hernia. No evidence of obstruction, inflammatory process, or abnormal fluid collections. Diverticulosis seen involving the descending and sigmoid colon, without evidence of diverticulitis. Vascular/Lymphatic: No pathologically enlarged lymph nodes identified. No abdominal aortic aneurysm. Aorto bi-iliac stent seen in place. Aortic atherosclerosis. Reproductive: Prior hysterectomy. Poor visualization due to beam hardening artifact from bilateral hip surgical hardware. Other:  None. Musculoskeletal: No suspicious bone lesions identified. Old osteopenic compression fracture deformities of T9, L1, and L2 vertebral bodies noted. IMPRESSION: Large left pleural  effusion, with atelectasis or infiltrate involving the lingula and left lower lobe. Opacification of left main stem and lower lobe bronchi, likely due to aspiration. Hepatic cirrhosis and large  amount of ascites. Small hiatal hernia. Colonic diverticulosis. No radiographic evidence of diverticulitis. Aortic and coronary artery atherosclerosis. Electronically Signed   By: Earle Gell M.D.   On: 01/20/2017 14:59   Dg Chest 1 View  Result Date: 12/04/2016 CLINICAL DATA:  Right hip fracture.  Ex-smoker. EXAM: CHEST 1 VIEW COMPARISON:  04/23/2014. FINDINGS: Grossly stable enlarged cardiac silhouette. Small to moderate amount of left lateral pleural fluid or thickening, significantly decreased. Mildly prominent interstitial markings with improvement. Thoracic aortic calcifications. Diffuse osteopenia. IMPRESSION: 1. Decreased left pleural thickening or fluid. 2. Stable cardiomegaly. 3. Aortic atherosclerosis. Electronically Signed   By: Claudie Revering M.D.   On: 12/04/2016 14:59   Dg Lumbar Spine 2-3 Views  Result Date: 12/04/2016 CLINICAL DATA:  Right hip pain.  Difficulty walking. EXAM: LUMBAR SPINE - 2-3 VIEW COMPARISON:  None FINDINGS: Degenerative disc disease in the upper and mid lumbar spine. Degenerative set disease in the lower lumbar spine. Slight anterolisthesis of L5 on S1. No fracture. Diffuse osteopenia. Diffuse aortic and iliac calcifications. Bilateral common iliac stents noted. IMPRESSION: Degenerative disc and facet disease. Osteopenia. No acute findings. Electronically Signed   By: Rolm Baptise M.D.   On: 12/04/2016 12:23   Dg Abdomen 1 View  Result Date: 01/02/2017 CLINICAL DATA:  Altered mental status, hypotension. Right hip arthroplasty on 12/05/2016. EXAM: ABDOMEN - 1 VIEW COMPARISON:  None. FINDINGS: Paucity of bowel gas within the visualized portion of the abdomen and pelvis. No dilated bowel loops seen. Overall density may indicate underlying ascites. Right hip arthroplasty, partially  imaged, with overlying surgical staples. Additional fixation screw within the left femoral head/ neck. Degenerative changes within the thoracolumbar spine, at least moderate in degree. No acute or suspicious osseous finding. IMPRESSION: 1. Paucity of bowel gas. No dilated large or small bowel loops identified. 2. Overall density of the abdomen raises the possibility of ascites. 3. Surgical changes related to recent right hip arthroplasty. Electronically Signed   By: Franki Cabot M.D.   On: 01/18/2017 14:31   Ct Head Wo Contrast  Result Date: 01/04/2017 CLINICAL DATA:  Altered mental status change. EXAM: CT HEAD WITHOUT CONTRAST TECHNIQUE: Contiguous axial images were obtained from the base of the skull through the vertex without intravenous contrast. COMPARISON:  Feb 05, 2014 FINDINGS: Brain: No subdural, epidural, or subarachnoid hemorrhage. The ventricles are similar in the interval although the temporal horns of the lateral ventricle are mildly more prominent. The sulci are stable. Cerebellum, brainstem, and basal cisterns are normal. No mass, mass effect, or midline shift. White matter changes are again identified. These are most prominent in the right posterior parietal lobe extending into the right temporal lobe. No acute ischemia or infarct identified. Vascular: Calcified atherosclerosis is seen in the intracranial carotid arteries. Skull: Normal. Negative for fracture or focal lesion. Sinuses/Orbits: No acute finding. Other: None. IMPRESSION: No acute intracranial abnormality or significant change. Electronically Signed   By: Dorise Bullion III M.D   On: 01/07/2017 14:06   Ct Chest Wo Contrast  Result Date: 01/16/2017 CLINICAL DATA:  Abdominal distention. Left pleural effusion. Hypotension. Several weeks postop from right hip replacement. EXAM: CT CHEST, ABDOMEN AND PELVIS WITHOUT CONTRAST TECHNIQUE: Multidetector CT imaging of the chest, abdomen and pelvis was performed following the standard  protocol without IV contrast. COMPARISON:  Chest CT only on 04/20/2014 FINDINGS: CT CHEST FINDINGS Cardiovascular: Stable mild cardiomegaly. Aortic and coronary artery atherosclerosis. Mediastinum/Lymph Nodes: No masses or pathologically enlarged lymph nodes identified on this unenhanced exam. Lungs/Pleura:  Large left pleural effusion is seen, with atelectasis or infiltrate within the lingula and left lower lobe. Opacification of the left mainstem and central lower lobe bronchi are seen, likely due to aspiration. Mild subsegmental atelectasis seen in the anterior right upper lobe. Tiny right pleural effusion also noted. Musculoskeletal:  No suspicious bone lesions identified. CT ABDOMEN AND PELVIS FINDINGS Hepatobiliary: Diffuse hepatic cirrhosis. No masses visualized on this unenhanced exam. Large amount of ascites seen throughout the abdomen and pelvis. Pancreas: No mass or inflammatory changes identified on this unenhanced exam. Spleen: Within normal limits in size. Adrenals/Urinary Tract: No evidence of urolithiasis or hydronephrosis. Urinary bladder obscured by beam hardening artifact from bilateral hip surgical hardware. Stomach/Bowel: Small hiatal hernia. No evidence of obstruction, inflammatory process, or abnormal fluid collections. Diverticulosis seen involving the descending and sigmoid colon, without evidence of diverticulitis. Vascular/Lymphatic: No pathologically enlarged lymph nodes identified. No abdominal aortic aneurysm. Aorto bi-iliac stent seen in place. Aortic atherosclerosis. Reproductive: Prior hysterectomy. Poor visualization due to beam hardening artifact from bilateral hip surgical hardware. Other:  None. Musculoskeletal: No suspicious bone lesions identified. Old osteopenic compression fracture deformities of T9, L1, and L2 vertebral bodies noted. IMPRESSION: Large left pleural effusion, with atelectasis or infiltrate involving the lingula and left lower lobe. Opacification of left main  stem and lower lobe bronchi, likely due to aspiration. Hepatic cirrhosis and large amount of ascites. Small hiatal hernia. Colonic diverticulosis. No radiographic evidence of diverticulitis. Aortic and coronary artery atherosclerosis. Electronically Signed   By: Earle Gell M.D.   On: 12/26/2016 14:59   Dg Chest Port 1 View  Result Date: 01/16/2017 CLINICAL DATA:  Altered mental status and hypotension. Right hip arthroplasty on 12/05/2016. History of atrial fibrillation, CHF, hypertension, diabetes, peptic ulcers, breast cancer with mastectomy. Former smoker. EXAM: PORTABLE CHEST 1 VIEW COMPARISON:  Chest x-rays dated 12/04/2016 and 04/23/2014. FINDINGS: Near complete opacification of the left hemithorax, likely related to large pleural effusion. Probable additional edema within the left lung apex. Right lung is clear. Heart is obscured by the overlying effusion and/or consolidation. Atherosclerotic changes again noted at the aortic arch. Paucity of bowel gas within the upper abdomen. No acute or suspicious osseous finding seen. IMPRESSION: 1. Near complete opacification of the left hemithorax, likely due to large pleural effusion. 2. Aortic atherosclerosis. Electronically Signed   By: Franki Cabot M.D.   On: 01/12/2017 14:26   Dg Hip Port Unilat With Pelvis 1v Right  Result Date: 12/05/2016 CLINICAL DATA:  Status post right hip hemiarthroplasty. EXAM: DG HIP (WITH OR WITHOUT PELVIS) 1V PORT RIGHT COMPARISON:  Pelvic radiograph 12/04/2016 FINDINGS: The patient has undergone hemiarthroplasty of the right hip. The prosthetic components are well seated without abnormal surrounding lucency. There is soft tissue gas at the operative site. The appearance of the left hip and associated hardware is unchanged. IMPRESSION: Right hip hemiarthroplasty without hardware abnormality. Electronically Signed   By: Ulyses Jarred M.D.   On: 12/05/2016 22:46   Dg Hip Unilat W Or Wo Pelvis 2-3 Views Right  Result Date:  12/04/2016 CLINICAL DATA:  Known right hip fracture. EXAM: DG HIP (WITH OR WITHOUT PELVIS) 2-3V RIGHT COMPARISON:  None. FINDINGS: There is a subcapital right hip fracture without dislocation. IMPRESSION: Subcapital right hip fracture without dislocation. Electronically Signed   By: Dorise Bullion III M.D   On: 12/04/2016 14:58   Dg Hip Unilat W Or W/o Pelvis 2-3 Views Right  Result Date: 12/04/2016 CLINICAL DATA:  Right hip pain.  Difficulty walking.  EXAM: DG HIP (WITH OR WITHOUT PELVIS) 2-3V RIGHT COMPARISON:  None. FINDINGS: There is a right femoral neck fracture with mild varus angulation. Mild degenerative changes in the hips bilaterally. Hardware noted in the left proximal femur. IMPRESSION: Right femoral neck fracture with varus angulation. Electronically Signed   By: Rolm Baptise M.D.   On: 12/04/2016 12:22    Microbiology Recent Results (from the past 240 hour(s))  Blood Culture (routine x 2)     Status: None (Preliminary result)   Collection Time: 01/05/2017  1:17 PM  Result Value Ref Range Status   Specimen Description BLOOD  RIGHT AC  Final   Special Requests BOTTLES DRAWN AEROBIC AND ANAEROBIC  BCHV  Final   Culture  Setup Time   Final    Organism ID to follow GRAM POSITIVE COCCI IN BOTH AEROBIC AND ANAEROBIC BOTTLES CRITICAL RESULT CALLED TO, READ BACK BY AND VERIFIED WITH: MATT MCBANE ON Jan 24, 2017 AT 0122 [facility]    Culture GRAM POSITIVE COCCI  Final   Report Status PENDING  Incomplete  Blood Culture (routine x 2)     Status: None (Preliminary result)   Collection Time: 12/24/2016  1:17 PM  Result Value Ref Range Status   Specimen Description BLOOD  RIGHT AC  Final   Special Requests BOTTLES DRAWN AEROBIC AND ANAEROBIC  BCHV  Final   Culture  Setup Time   Final    GRAM POSITIVE COCCI IN BOTH AEROBIC AND ANAEROBIC BOTTLES CRITICAL VALUE NOTED.  VALUE IS CONSISTENT WITH PREVIOUSLY REPORTED AND CALLED VALUE. [facility]    Culture GRAM POSITIVE COCCI  Final   Report Status PENDING   Incomplete  Urine culture     Status: Abnormal   Collection Time: 01/13/2017  1:17 PM  Result Value Ref Range Status   Specimen Description URINE, CATHETERIZED  Final   Special Requests NONE  Final   Culture (A)  Final    <10,000 COLONIES/mL INSIGNIFICANT GROWTH Performed at [facility], [address], [place], [place] [address]    Report Status 01-24-17 FINAL  Final  Blood Culture ID Panel (Reflexed)     Status: Abnormal   Collection Time: 01/12/2017  1:17 PM  Result Value Ref Range Status   Enterococcus species NOT DETECTED NOT DETECTED Final   Listeria monocytogenes NOT DETECTED NOT DETECTED Final   Staphylococcus species NOT DETECTED NOT DETECTED Final   Staphylococcus aureus NOT DETECTED NOT DETECTED Final   Streptococcus species DETECTED (A) NOT DETECTED Final    Comment: CRITICAL RESULT CALLED TO, READ BACK BY AND VERIFIED WITH: MATT MCBANE ON 01/24/17 AT 0122 [facility]    Streptococcus agalactiae DETECTED (A) NOT DETECTED Final    Comment: CRITICAL RESULT CALLED TO, READ BACK BY AND VERIFIED WITH: MATT MCBANE ON 01/24/2017 AT 0122 [facility]    Streptococcus pneumoniae NOT DETECTED NOT DETECTED Final   Streptococcus pyogenes NOT DETECTED NOT DETECTED Final   Acinetobacter baumannii NOT DETECTED NOT DETECTED Final   Enterobacteriaceae species NOT DETECTED NOT DETECTED Final   Enterobacter cloacae complex NOT DETECTED NOT DETECTED Final   Escherichia coli NOT DETECTED NOT DETECTED Final   Klebsiella oxytoca NOT DETECTED NOT DETECTED Final   Klebsiella pneumoniae NOT DETECTED NOT DETECTED Final   Proteus species NOT DETECTED NOT DETECTED Final   Serratia marcescens NOT DETECTED NOT DETECTED Final   Haemophilus influenzae NOT DETECTED NOT DETECTED Final   Neisseria meningitidis NOT DETECTED NOT DETECTED Final   Pseudomonas aeruginosa NOT DETECTED NOT DETECTED Final  Candida albicans NOT DETECTED NOT DETECTED Final   Candida glabrata NOT DETECTED NOT DETECTED Final   Candida krusei  NOT DETECTED NOT DETECTED Final   Candida parapsilosis NOT DETECTED NOT DETECTED Final   Candida tropicalis NOT DETECTED NOT DETECTED Final    Lab Basic Metabolic Panel:  Recent Labs Lab 01/22/2017 1316  NA 137  K 4.5  CL 109  CO2 17*  GLUCOSE 123*  BUN 123*  CREATININE 2.49*  CALCIUM 9.4   Liver Function Tests:  Recent Labs Lab 01/17/2017 1316  AST 35  ALT 14  ALKPHOS 132*  BILITOT 0.9  PROT 5.4*  ALBUMIN 1.8*    Recent Labs Lab 01/20/2017 1316  LIPASE 20   No results for input(s): AMMONIA in the last 168 hours. CBC:  Recent Labs Lab 01/09/2017 1316  WBC 5.0  NEUTROABS 4.7  HGB 11.8*  HCT 37.5  MCV 90.2  PLT 207   Cardiac Enzymes:  Recent Labs Lab 01/06/2017 1316  TROPONINI 0.03*   Sepsis Labs:  Recent Labs Lab 01/18/2017 1316 12/31/16 1752  WBC 5.0  --   LATICACIDVEN 5.0* 6.7*    Procedures/Operations  None   Blanch Stang J 01-02-2017, 3:56 PM

## 2017-01-23 NOTE — Progress Notes (Signed)
Son and daughter in law in to see patient-funeral release signed-emotional support given.

## 2017-01-23 NOTE — Telephone Encounter (Signed)
Spoken and notified Jackelyn Poling of Kate's comments.

## 2017-01-23 NOTE — Progress Notes (Signed)
Pt without respirations or heartbeat

## 2017-01-23 NOTE — Telephone Encounter (Signed)
Clint Lipps nurse with [facility] left v/m; pt was admitted to [facility] on 12/29/16 and did wound care over weekend; on visit 12/29/2016 pt was less responsive, low BP and pulse; pt was taken to [facility] ED by ambulance and pt did poorly and expired. Debbie request cb to make sure no further questions.

## 2017-01-23 NOTE — Progress Notes (Signed)
Released from [place] by Caesar Chestnut [phone]

## 2017-01-23 NOTE — Telephone Encounter (Signed)
Message left for Clint Lipps with [facility] to return my call.

## 2017-01-23 NOTE — Telephone Encounter (Signed)
Sharon Frazier returned [name]'s call.  Please call Sharon Frazier back at [phone].

## 2017-01-23 NOTE — Progress Notes (Signed)
PHARMACY - PHYSICIAN COMMUNICATION CRITICAL VALUE ALERT - BLOOD CULTURE IDENTIFICATION (BCID)  Results for orders placed or performed during the hospital encounter of 01/18/2017  Blood Culture ID Panel (Reflexed) (Collected: 01/17/2017  1:17 PM)  Result Value Ref Range   Enterococcus species NOT DETECTED NOT DETECTED   Listeria monocytogenes NOT DETECTED NOT DETECTED   Staphylococcus species NOT DETECTED NOT DETECTED   Staphylococcus aureus NOT DETECTED NOT DETECTED   Streptococcus species DETECTED (A) NOT DETECTED   Streptococcus agalactiae DETECTED (A) NOT DETECTED   Streptococcus pneumoniae NOT DETECTED NOT DETECTED   Streptococcus pyogenes NOT DETECTED NOT DETECTED   Acinetobacter baumannii NOT DETECTED NOT DETECTED   Enterobacteriaceae species NOT DETECTED NOT DETECTED   Enterobacter cloacae complex NOT DETECTED NOT DETECTED   Escherichia coli NOT DETECTED NOT DETECTED   Klebsiella oxytoca NOT DETECTED NOT DETECTED   Klebsiella pneumoniae NOT DETECTED NOT DETECTED   Proteus species NOT DETECTED NOT DETECTED   Serratia marcescens NOT DETECTED NOT DETECTED   Haemophilus influenzae NOT DETECTED NOT DETECTED   Neisseria meningitidis NOT DETECTED NOT DETECTED   Pseudomonas aeruginosa NOT DETECTED NOT DETECTED   Candida albicans NOT DETECTED NOT DETECTED   Candida glabrata NOT DETECTED NOT DETECTED   Candida krusei NOT DETECTED NOT DETECTED   Candida parapsilosis NOT DETECTED NOT DETECTED   Candida tropicalis NOT DETECTED NOT DETECTED    Name of physician (or Provider) Contacted: Willis  Changes to prescribed antibiotics required: n/a. Pt is comfort care.  Macai Sisneros S 01/28/17  1:35 AM

## 2017-01-23 DEATH — deceased

## 2017-02-12 ENCOUNTER — Ambulatory Visit: Payer: Medicare Other | Admitting: Podiatry

## 2017-03-08 ENCOUNTER — Ambulatory Visit: Payer: Medicare Other | Admitting: Family

## 2017-05-07 ENCOUNTER — Ambulatory Visit: Payer: Medicare Other | Admitting: Primary Care

## 2018-06-01 IMAGING — CR DG CHEST 1V
1 series · 1 of 1 positions shown · non-contrast
Comparison: 04/23/2014.

CLINICAL DATA: Right hip fracture.  Ex-smoker.

EXAM:
CHEST 1 VIEW

[chest ap]
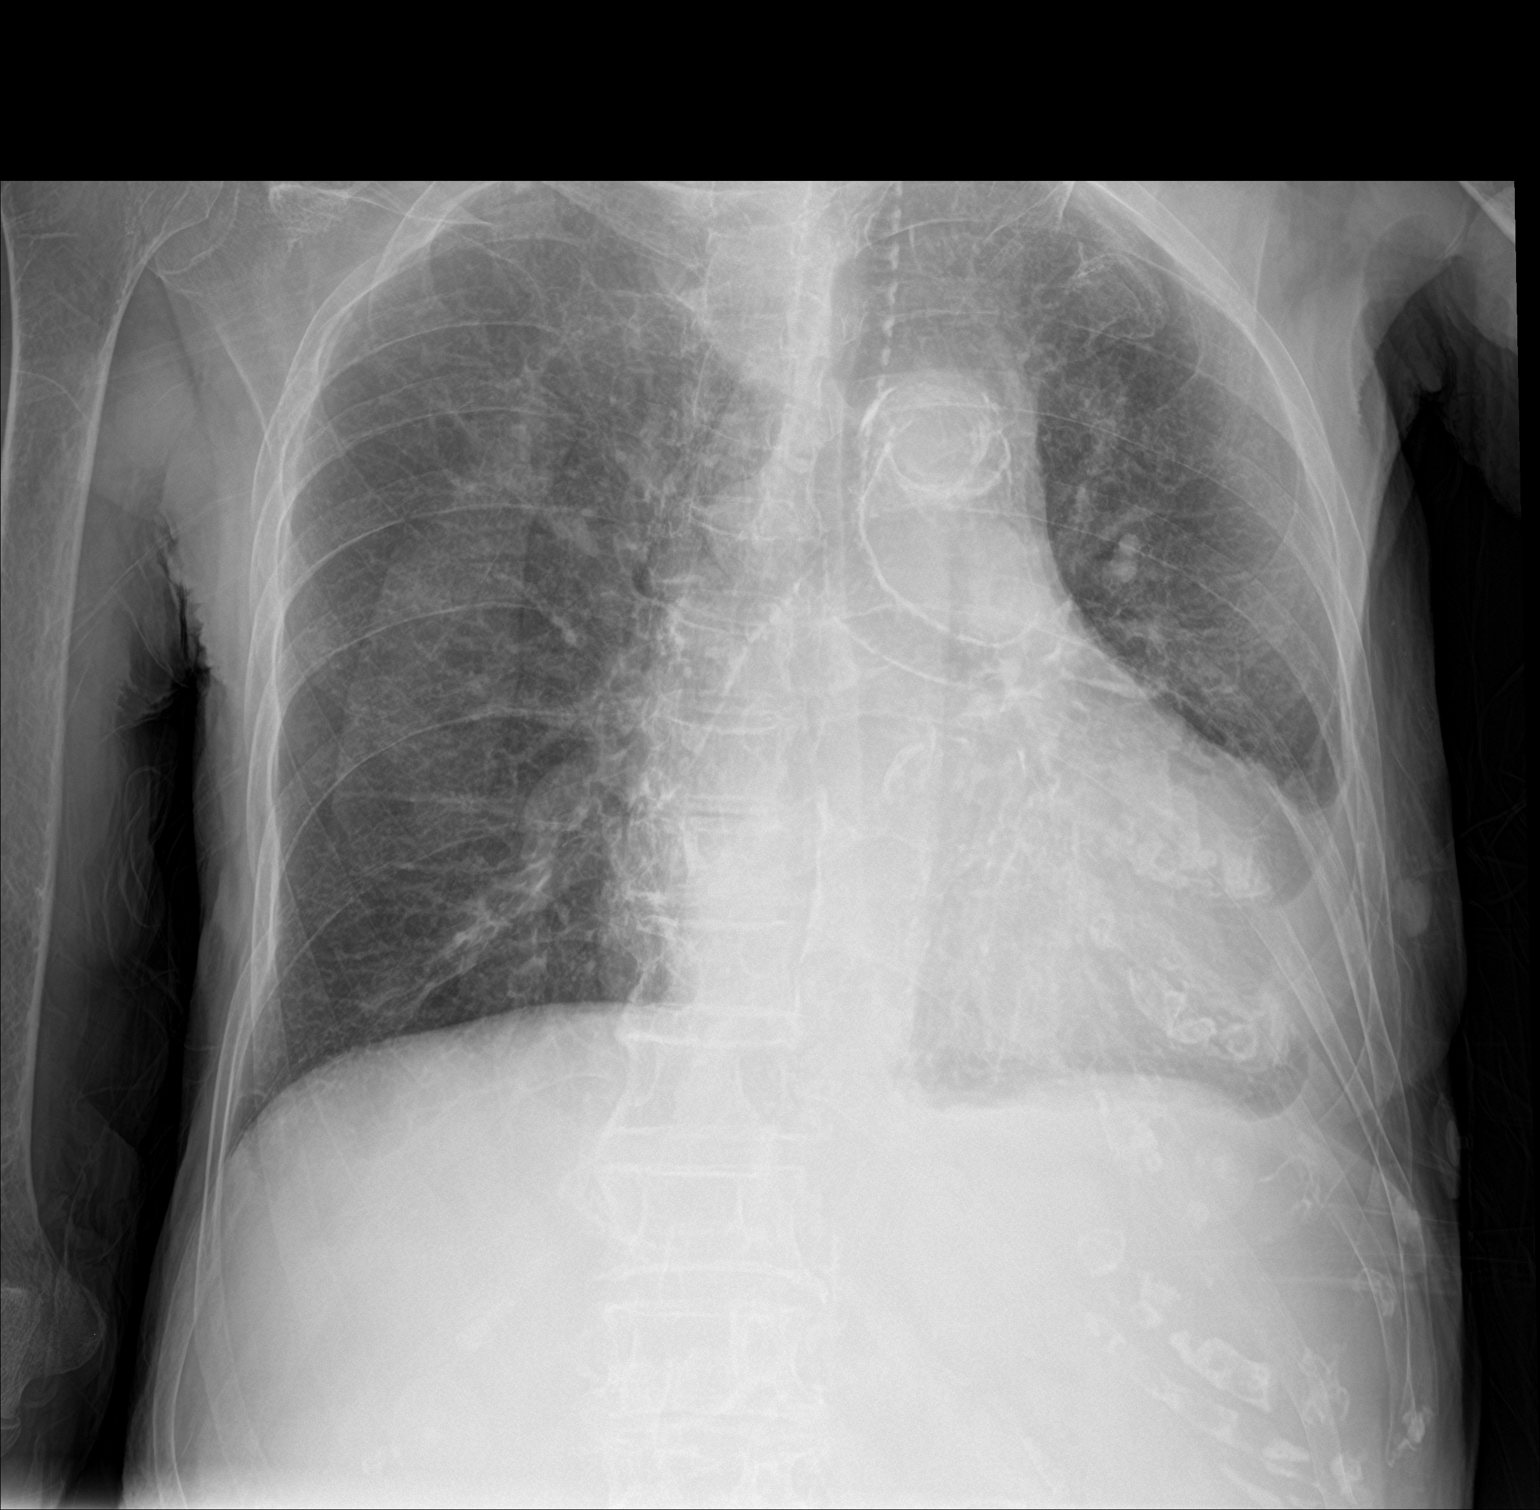

[1 of 1 positions shown; findings below may reference images not displayed]

FINDINGS: Grossly stable enlarged cardiac silhouette. Small to moderate amount
of left lateral pleural fluid or thickening, significantly
decreased. Mildly prominent interstitial markings with improvement.
Thoracic aortic calcifications. Diffuse osteopenia.
IMPRESSION: 1. Decreased left pleural thickening or fluid.
2. Stable cardiomegaly.
3. Aortic atherosclerosis.

## 2018-06-01 IMAGING — DX DG LUMBAR SPINE 2-3V
3 series · 3 of 3 positions shown · non-contrast
Comparison: None

CLINICAL DATA: Right hip pain.  Difficulty walking.

EXAM:
LUMBAR SPINE - 2-3 VIEW

[l-spine ap]
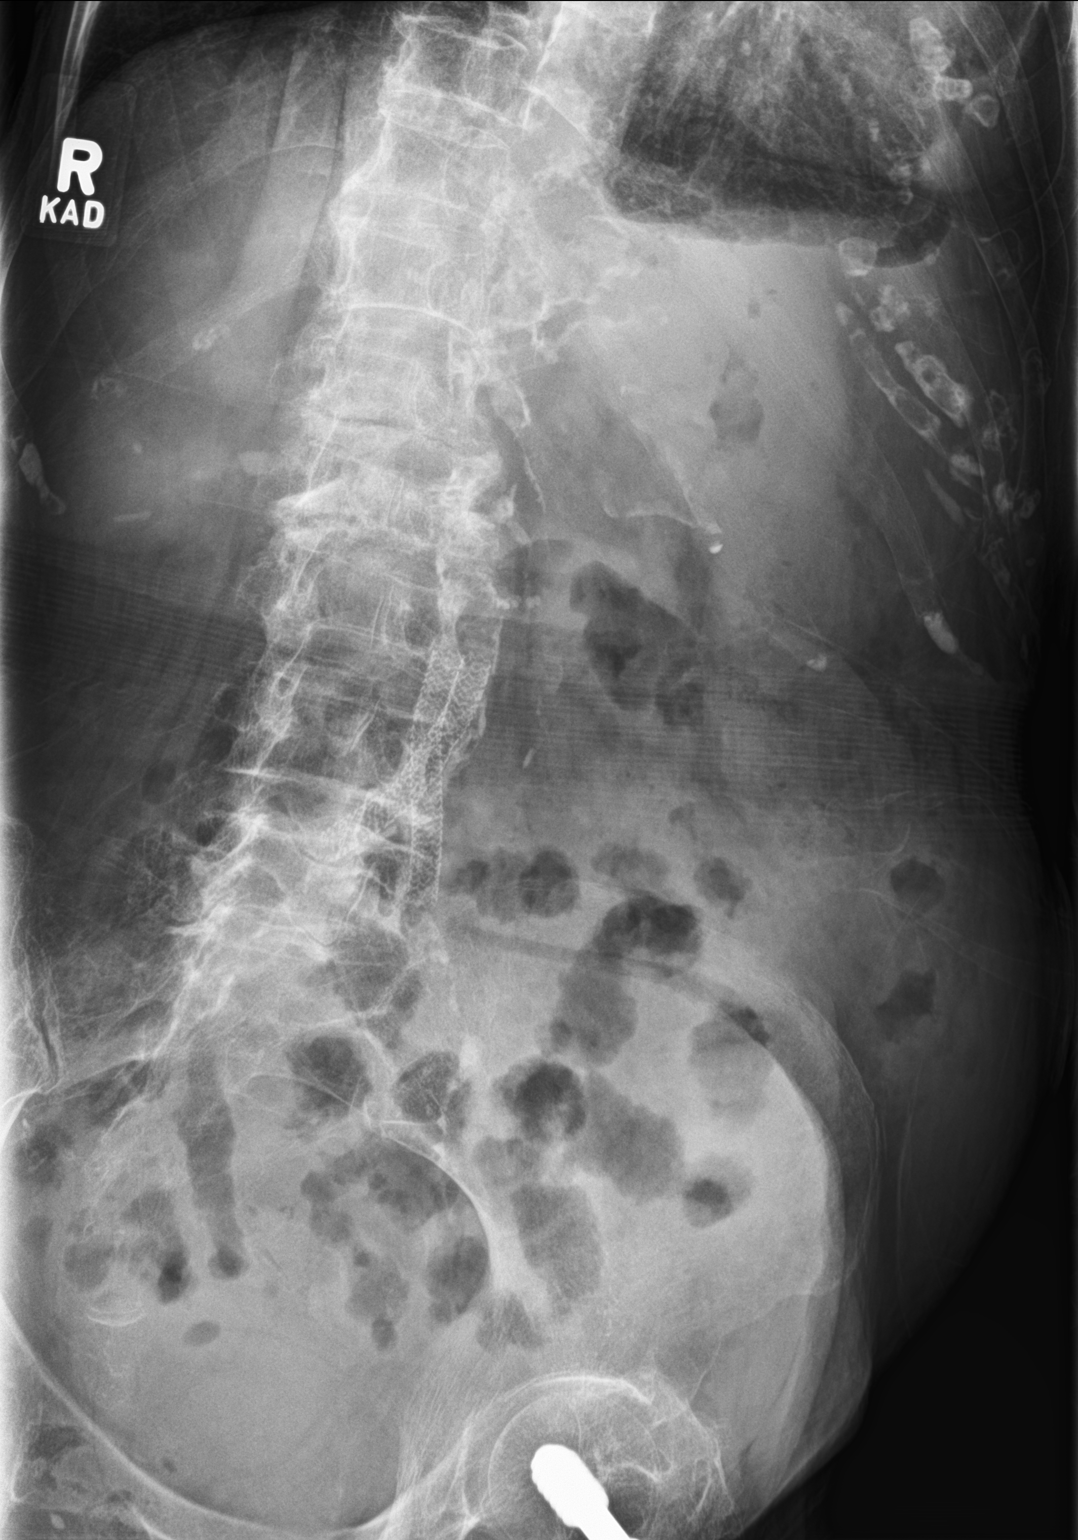

[l-spine lat]
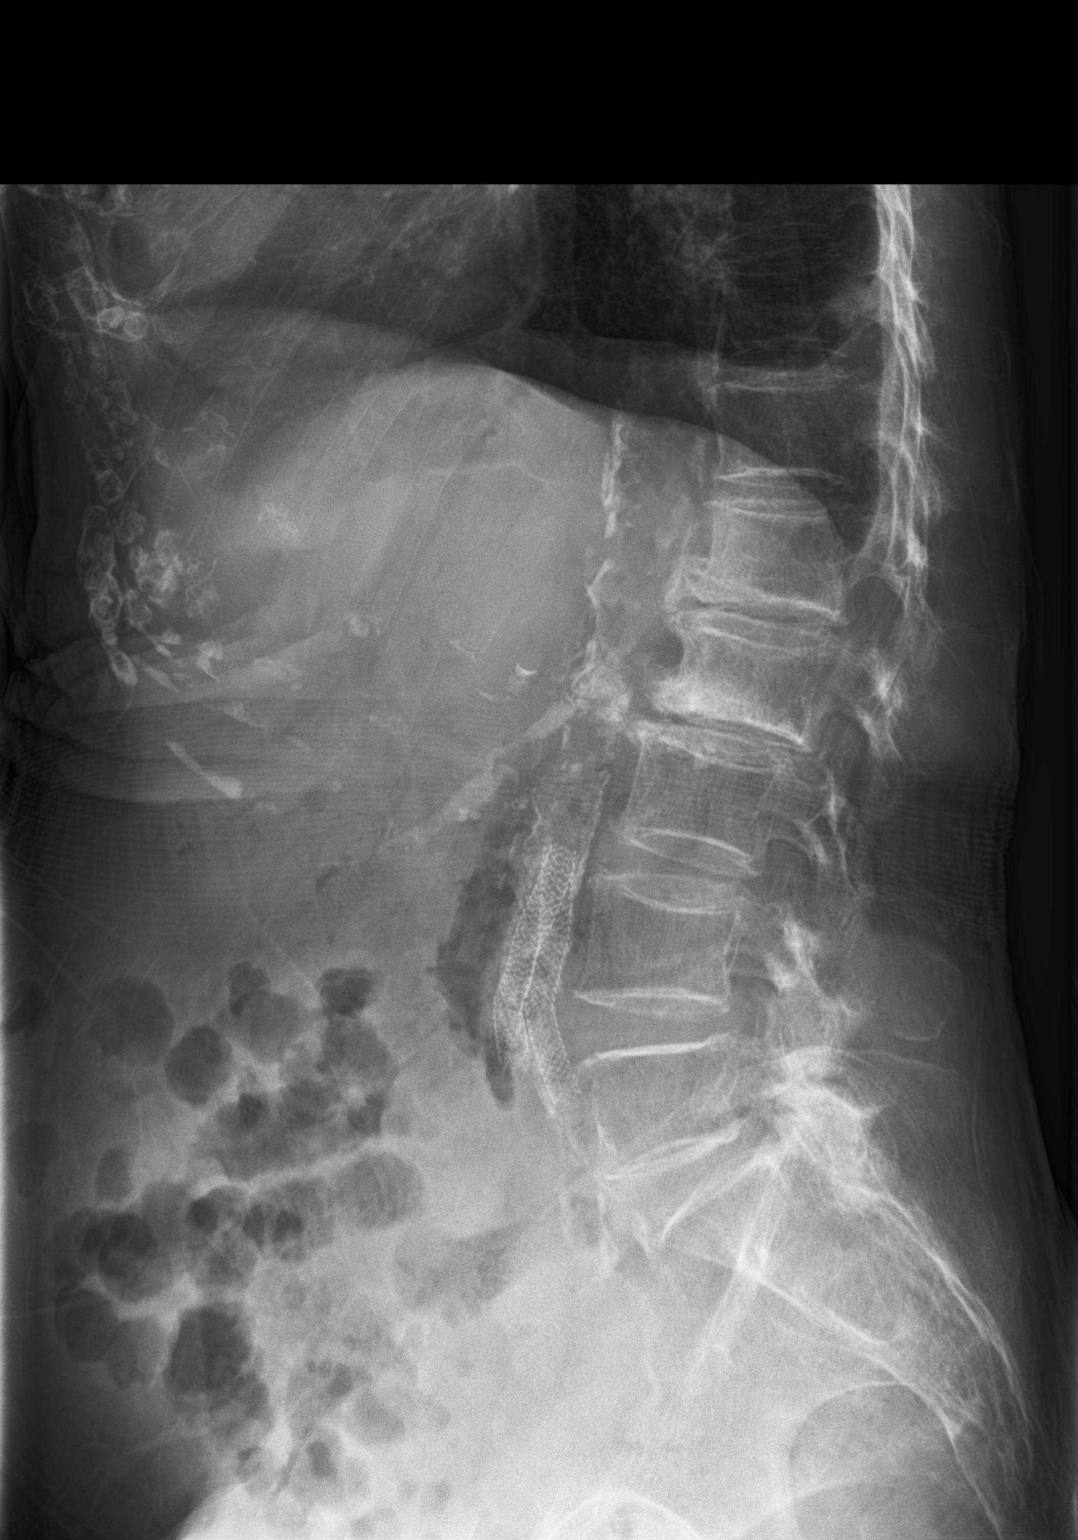

[l-spine l5/s1]
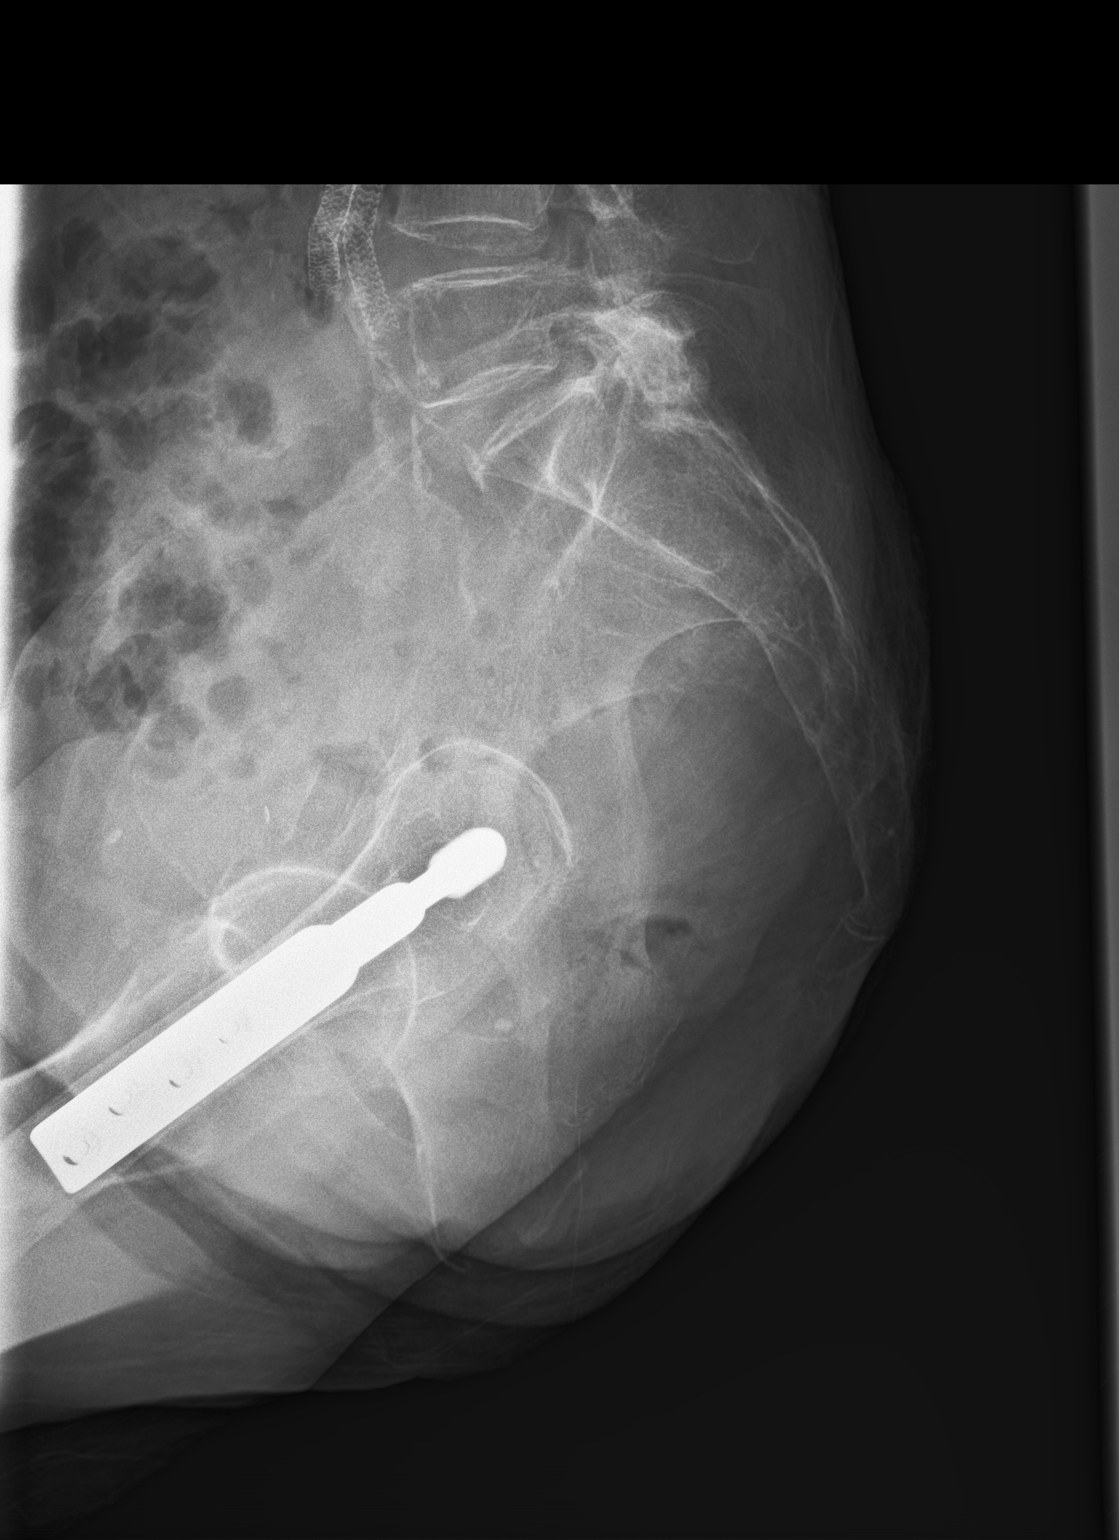

[3 of 3 positions shown; findings below may reference images not displayed]

FINDINGS: Degenerative disc disease in the upper and mid lumbar spine.
Degenerative set disease in the lower lumbar spine. Slight
anterolisthesis of L5 on S1. No fracture. Diffuse osteopenia.
Diffuse aortic and iliac calcifications. Bilateral common iliac
stents noted.
IMPRESSION: Degenerative disc and facet disease. Osteopenia. No acute findings.

## 2018-06-01 IMAGING — DX DG HIP (WITH OR WITHOUT PELVIS) 2-3V*R*
2 series · 2 of 2 positions shown · non-contrast
Comparison: None.

CLINICAL DATA: Right hip pain.  Difficulty walking.

EXAM:
DG HIP (WITH OR WITHOUT PELVIS) 2-3V RIGHT

[pelvis ap]
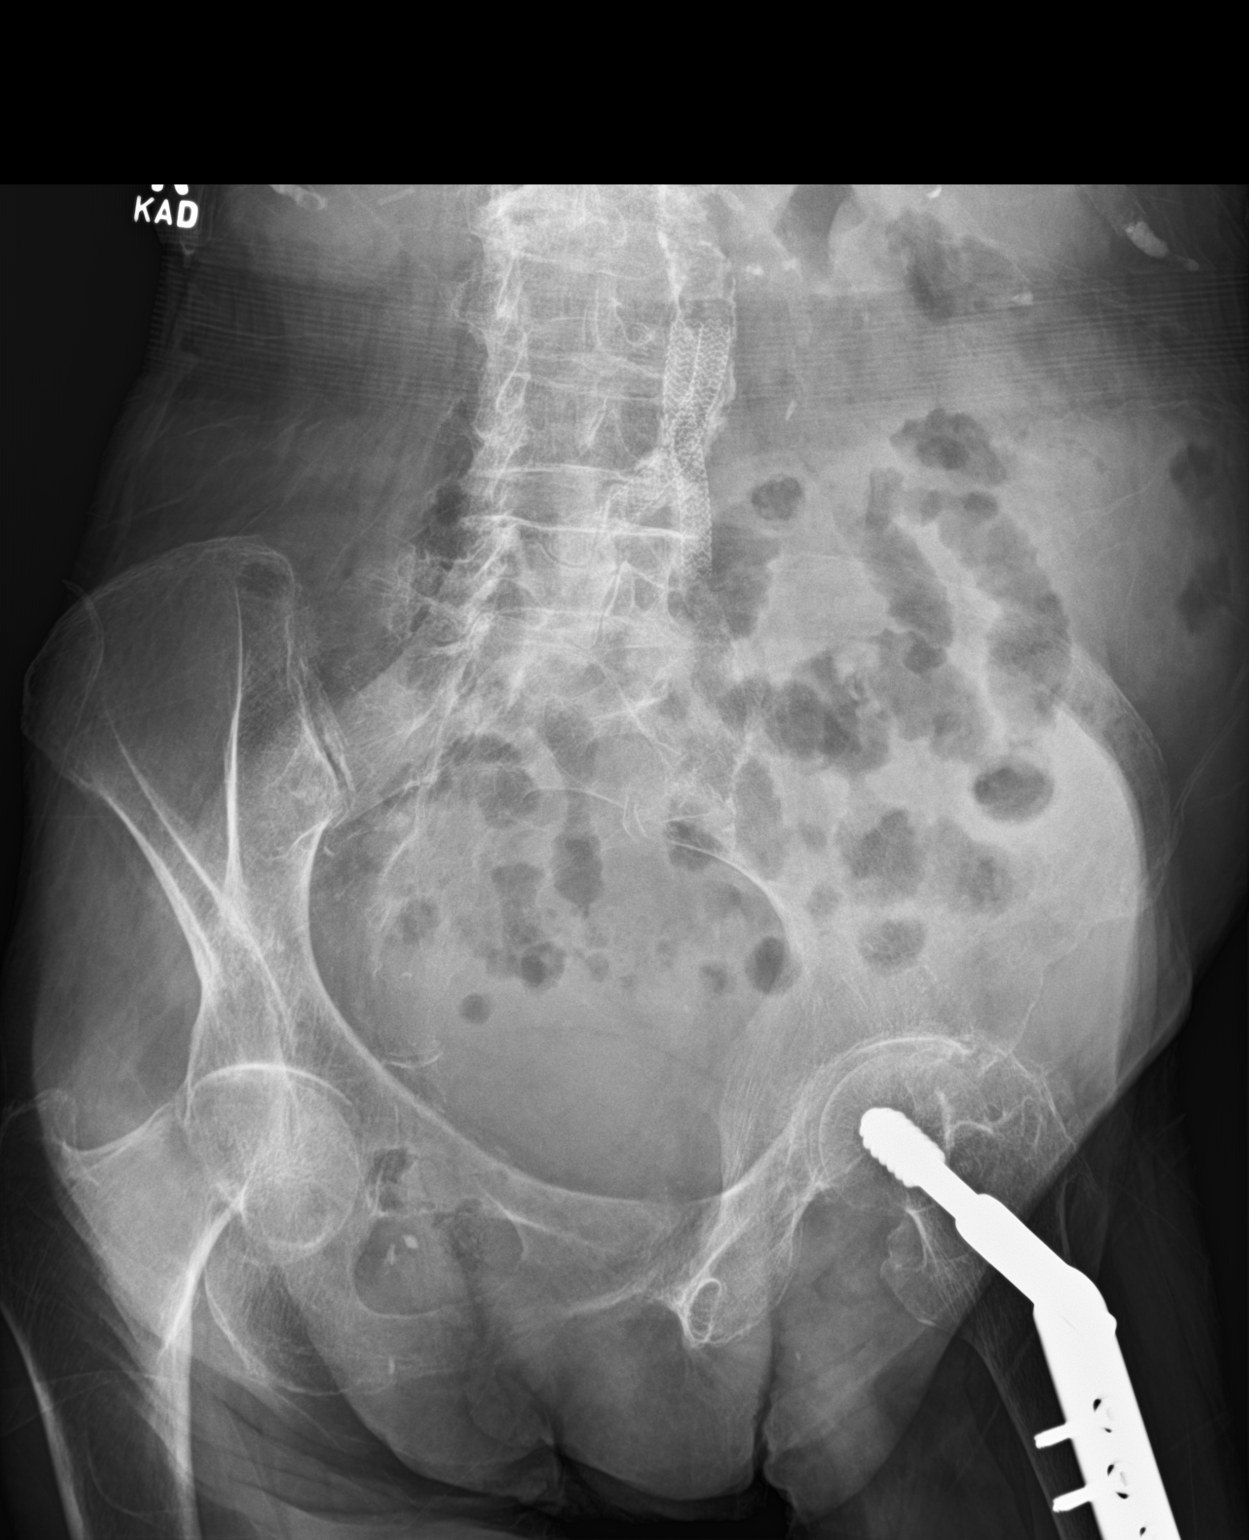

[hip ap]
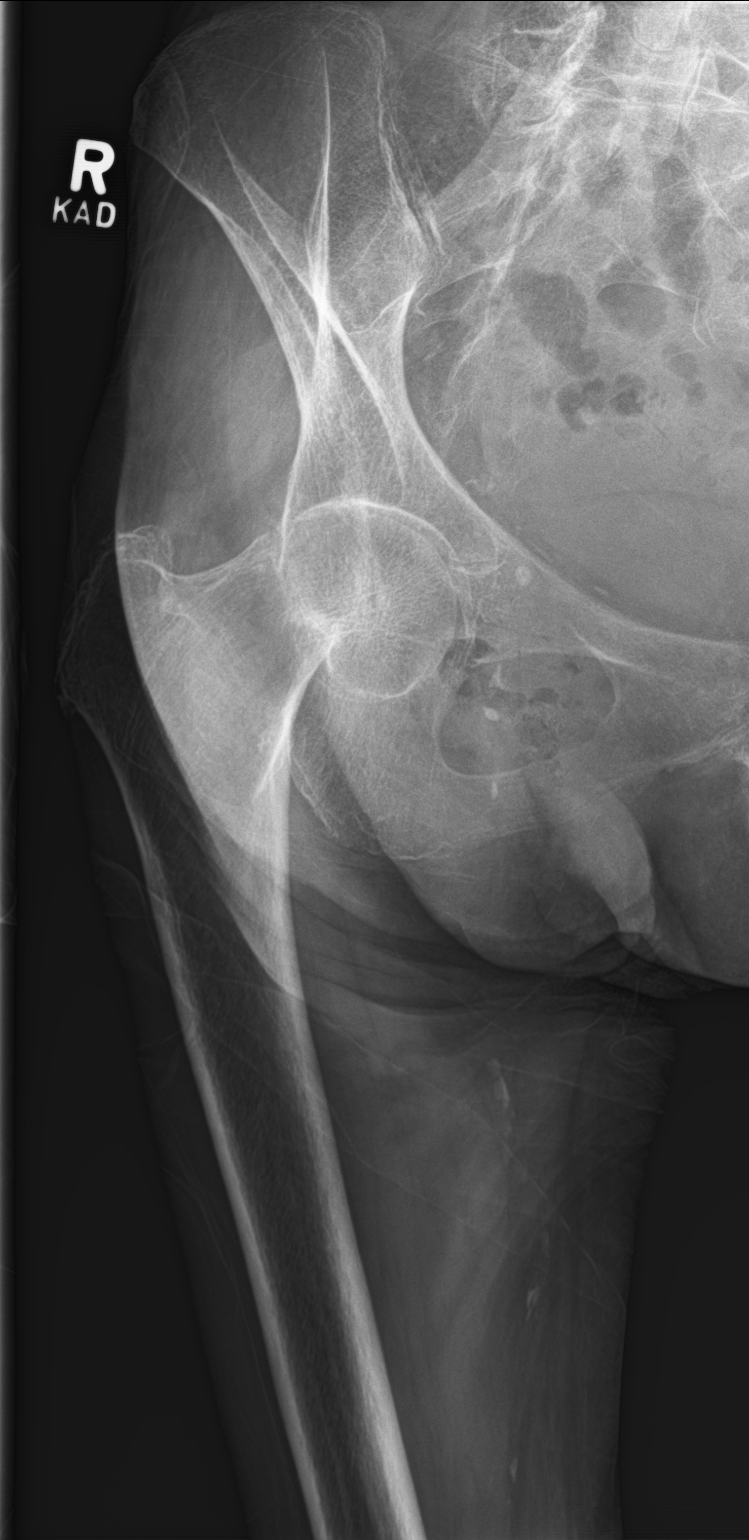

[2 of 2 positions shown; findings below may reference images not displayed]

FINDINGS: There is a right femoral neck fracture with mild varus angulation.
Mild degenerative changes in the hips bilaterally. Hardware noted in
the left proximal femur.
IMPRESSION: Right femoral neck fracture with varus angulation.
